# Patient Record
Sex: Female | Born: 1955 | Race: Black or African American | Hispanic: No | Marital: Single | Smoking: Never smoker
Health system: Southern US, Community
[De-identification: ages and names within clinical notes are randomized; demographics above are authoritative.]

## PROBLEM LIST (undated history)

## (undated) DIAGNOSIS — Z5189 Encounter for other specified aftercare: Secondary | ICD-10-CM

## (undated) DIAGNOSIS — E039 Hypothyroidism, unspecified: Secondary | ICD-10-CM

## (undated) DIAGNOSIS — F329 Major depressive disorder, single episode, unspecified: Secondary | ICD-10-CM

## (undated) DIAGNOSIS — F32A Depression, unspecified: Secondary | ICD-10-CM

## (undated) DIAGNOSIS — I10 Essential (primary) hypertension: Secondary | ICD-10-CM

## (undated) DIAGNOSIS — M1990 Unspecified osteoarthritis, unspecified site: Secondary | ICD-10-CM

## (undated) DIAGNOSIS — E785 Hyperlipidemia, unspecified: Secondary | ICD-10-CM

## (undated) DIAGNOSIS — E119 Type 2 diabetes mellitus without complications: Secondary | ICD-10-CM

## (undated) DIAGNOSIS — H269 Unspecified cataract: Secondary | ICD-10-CM

## (undated) HISTORY — DX: Encounter for other specified aftercare: Z51.89

## (undated) HISTORY — DX: Hyperlipidemia, unspecified: E78.5

## (undated) HISTORY — PX: TUBAL LIGATION: SHX77

## (undated) HISTORY — DX: Unspecified cataract: H26.9

## (undated) HISTORY — PX: ABDOMINAL HYSTERECTOMY: SHX81

---

## 2006-07-26 ENCOUNTER — Emergency Department (HOSPITAL_COMMUNITY): Admission: EM | Admit: 2006-07-26 | Discharge: 2006-07-26 | Payer: Self-pay | Admitting: *Deleted

## 2013-09-07 DIAGNOSIS — E1169 Type 2 diabetes mellitus with other specified complication: Secondary | ICD-10-CM | POA: Insufficient documentation

## 2013-09-07 DIAGNOSIS — E785 Hyperlipidemia, unspecified: Secondary | ICD-10-CM | POA: Insufficient documentation

## 2013-09-07 DIAGNOSIS — E669 Obesity, unspecified: Secondary | ICD-10-CM | POA: Insufficient documentation

## 2013-12-08 DIAGNOSIS — I152 Hypertension secondary to endocrine disorders: Secondary | ICD-10-CM | POA: Insufficient documentation

## 2013-12-08 DIAGNOSIS — E119 Type 2 diabetes mellitus without complications: Secondary | ICD-10-CM | POA: Insufficient documentation

## 2013-12-08 DIAGNOSIS — E669 Obesity, unspecified: Secondary | ICD-10-CM | POA: Insufficient documentation

## 2013-12-08 DIAGNOSIS — Z7985 Long-term (current) use of injectable non-insulin antidiabetic drugs: Secondary | ICD-10-CM | POA: Insufficient documentation

## 2013-12-08 DIAGNOSIS — I10 Essential (primary) hypertension: Secondary | ICD-10-CM | POA: Insufficient documentation

## 2013-12-08 DIAGNOSIS — E1169 Type 2 diabetes mellitus with other specified complication: Secondary | ICD-10-CM | POA: Insufficient documentation

## 2013-12-08 DIAGNOSIS — E1159 Type 2 diabetes mellitus with other circulatory complications: Secondary | ICD-10-CM | POA: Insufficient documentation

## 2015-05-28 DIAGNOSIS — E559 Vitamin D deficiency, unspecified: Secondary | ICD-10-CM | POA: Insufficient documentation

## 2015-05-28 DIAGNOSIS — E039 Hypothyroidism, unspecified: Secondary | ICD-10-CM | POA: Insufficient documentation

## 2015-05-29 DIAGNOSIS — M5440 Lumbago with sciatica, unspecified side: Secondary | ICD-10-CM | POA: Insufficient documentation

## 2015-06-15 ENCOUNTER — Other Ambulatory Visit: Payer: Self-pay | Admitting: Neurological Surgery

## 2015-06-29 ENCOUNTER — Encounter (HOSPITAL_COMMUNITY)
Admission: RE | Admit: 2015-06-29 | Discharge: 2015-06-29 | Disposition: A | Discharge status: Home / Self Care | Payer: BLUE CROSS/BLUE SHIELD | Source: Ambulatory Visit | Attending: Neurological Surgery | Admitting: Neurological Surgery

## 2015-06-29 ENCOUNTER — Encounter (HOSPITAL_COMMUNITY): Payer: Self-pay

## 2015-06-29 DIAGNOSIS — Z01812 Encounter for preprocedural laboratory examination: Secondary | ICD-10-CM

## 2015-06-29 DIAGNOSIS — Z01818 Encounter for other preprocedural examination: Secondary | ICD-10-CM | POA: Insufficient documentation

## 2015-06-29 DIAGNOSIS — I10 Essential (primary) hypertension: Secondary | ICD-10-CM

## 2015-06-29 DIAGNOSIS — Z0183 Encounter for blood typing: Secondary | ICD-10-CM

## 2015-06-29 DIAGNOSIS — M4317 Spondylolisthesis, lumbosacral region: Secondary | ICD-10-CM

## 2015-06-29 DIAGNOSIS — M4726 Other spondylosis with radiculopathy, lumbar region: Secondary | ICD-10-CM | POA: Insufficient documentation

## 2015-06-29 HISTORY — DX: Essential (primary) hypertension: I10

## 2015-06-29 HISTORY — DX: Type 2 diabetes mellitus without complications: E11.9

## 2015-06-29 HISTORY — DX: Hypothyroidism, unspecified: E03.9

## 2015-06-29 HISTORY — DX: Major depressive disorder, single episode, unspecified: F32.9

## 2015-06-29 HISTORY — DX: Depression, unspecified: F32.A

## 2015-06-29 HISTORY — DX: Unspecified osteoarthritis, unspecified site: M19.90

## 2015-06-29 LAB — APTT: aPTT: 34 seconds (ref 24–37)

## 2015-06-29 LAB — CBC
HCT: 40.8 % (ref 36.0–46.0)
HEMOGLOBIN: 12.9 g/dL (ref 12.0–15.0)
MCH: 27.9 pg (ref 26.0–34.0)
MCHC: 31.6 g/dL (ref 30.0–36.0)
MCV: 88.1 fL (ref 78.0–100.0)
Platelets: 290 10*3/uL (ref 150–400)
RBC: 4.63 MIL/uL (ref 3.87–5.11)
RDW: 13.9 % (ref 11.5–15.5)
WBC: 8.3 10*3/uL (ref 4.0–10.5)

## 2015-06-29 LAB — BASIC METABOLIC PANEL
ANION GAP: 10 (ref 5–15)
BUN: 9 mg/dL (ref 6–20)
CALCIUM: 9.4 mg/dL (ref 8.9–10.3)
CO2: 23 mmol/L (ref 22–32)
Chloride: 108 mmol/L (ref 101–111)
Creatinine, Ser: 0.62 mg/dL (ref 0.44–1.00)
Glucose, Bld: 103 mg/dL — ABNORMAL HIGH (ref 65–99)
Potassium: 4.3 mmol/L (ref 3.5–5.1)
SODIUM: 141 mmol/L (ref 135–145)

## 2015-06-29 LAB — PROTIME-INR
INR: 0.98 (ref 0.00–1.49)
PROTHROMBIN TIME: 13.2 s (ref 11.6–15.2)

## 2015-06-29 LAB — TYPE AND SCREEN
ABO/RH(D): O POS
ANTIBODY SCREEN: NEGATIVE

## 2015-06-29 LAB — SURGICAL PCR SCREEN
MRSA, PCR: NEGATIVE
Staphylococcus aureus: NEGATIVE

## 2015-06-29 LAB — ABO/RH: ABO/RH(D): O POS

## 2015-06-29 LAB — GLUCOSE, CAPILLARY: Glucose-Capillary: 91 mg/dL (ref 65–99)

## 2015-06-29 NOTE — Progress Notes (Signed)
PCP is Dr. Juanita Craver Denies seeing a cardiologist Denies ever having a card cath, or stress test Pt states she might have had an echo request sent to Dr Burnett's office Reports her fasting cbgs' run 130's - 140's .

## 2015-06-29 NOTE — Pre-Procedure Instructions (Signed)
Lisa Stephens  06/29/2015      [facility] - [place], [place] - [address] [name]  [address] Phone: [phone] Fax: [phone]    Your procedure is scheduled on Mon, Mar 27 @ 8:30 AM  Report to [facility] Admitting at 6:30 AM  Call this number if you have problems the morning of surgery:  [phone]   Remember:  Do not eat food or drink liquids after midnight.  Take these medicines the morning of surgery with A SIP OF WATER Amlodipine(Norvasc) and Synthroid(Levothyroxine)              Stop taking your Aspirin,Aleve,and Fish Oil. No Goody's,BC's,Advil,Motrin,Ibuprofen,or any Herbal Medications.     How to Manage Your Diabetes Before and After Surgery  Why is it important to control my blood sugar before and after surgery? . Improving blood sugar levels before and after surgery helps healing and can limit problems. . A way of improving blood sugar control is eating a healthy diet by: o  Eating less sugar and carbohydrates o  Increasing activity/exercise o  Talking with your doctor about reaching your blood sugar goals . High blood sugars (greater than 180 mg/dL) can raise your risk of infections and slow your recovery, so you will need to focus on controlling your diabetes during the weeks before surgery. . Make sure that the doctor who takes care of your diabetes knows about your planned surgery including the date and location.  How do I manage my blood sugar before surgery? . Check your blood sugar at least 4 times a day, starting 2 days before surgery, to make sure that the level is not too high or low. o Check your blood sugar the morning of your surgery when you wake up and every 2 hours until you get to the Short Stay unit. . If your blood sugar is less than 70 mg/dL, you will need to treat for low blood sugar: o Do not take insulin. o Treat a low blood sugar (less than 70 mg/dL) with  cup of clear juice (cranberry or  apple), 4 glucose tablets, OR glucose gel. o Recheck blood sugar in 15 minutes after treatment (to make sure it is greater than 70 mg/dL). If your blood sugar is not greater than 70 mg/dL on recheck, call [phone] for further instructions. . Report your blood sugar to the short stay nurse when you get to Short Stay.  . If you are admitted to the hospital after surgery: o Your blood sugar will be checked by the staff and you will probably be given insulin after surgery (instead of oral diabetes medicines) to make sure you have good blood sugar levels. o The goal for blood sugar control after surgery is 80-180 mg/dL.              WHAT DO I DO ABOUT MY DIABETES MEDICATION?   Marland Kitchen Do not take oral diabetes medicines (pills) the morning of surgery.  .       .   . The day of surgery, do not take other diabetes injectables, including Byetta (exenatide), Bydureon (exenatide ER), Victoza (liraglutide), or Trulicity (dulaglutide).  . If your CBG is greater than 220 mg/dL, you may take  of your sliding scale (correction) dose of insulin.  Other Instructions:          Patient Signature:  Date:   Nurse Signature:  Date:   Reviewed and Endorsed by [facility]  Patient Education Committee, August 2015   Do not wear jewelry, make-up or nail polish.  Do not wear lotions, powders, or perfumes.  You may wear deodorant.  Do not shave 48 hours prior to surgery.   Do not bring valuables to the hospital.  [facility] is not responsible for any belongings or valuables.  Contacts, dentures or bridgework may not be worn into surgery.  Leave your suitcase in the car.  After surgery it may be brought to your room.  For patients admitted to the hospital, discharge time will be determined by your treatment team.  Patients discharged the day of surgery will not be allowed to drive home.    Special instructions:  [facility] - Preparing for Surgery  Before surgery, you can play an  important role.  Because skin is not sterile, your skin needs to be as free of germs as possible.  You can reduce the number of germs on you skin by washing with CHG (chlorahexidine gluconate) soap before surgery.  CHG is an antiseptic cleaner which kills germs and bonds with the skin to continue killing germs even after washing.  Please DO NOT use if you have an allergy to CHG or antibacterial soaps.  If your skin becomes reddened/irritated stop using the CHG and inform your nurse when you arrive at Short Stay.  Do not shave (including legs and underarms) for at least 48 hours prior to the first CHG shower.  You may shave your face.  Please follow these instructions carefully:   1.  Shower with CHG Soap the night before surgery and the                                morning of Surgery.  2.  If you choose to wash your hair, wash your hair first as usual with your       normal shampoo.  3.  After you shampoo, rinse your hair and body thoroughly to remove the                      Shampoo.  4.  Use CHG as you would any other liquid soap.  You can apply chg directly       to the skin and wash gently with scrungie or a clean washcloth.  5.  Apply the CHG Soap to your body ONLY FROM THE NECK DOWN.        Do not use on open wounds or open sores.  Avoid contact with your eyes,       ears, mouth and genitals (private parts).  Wash genitals (private parts)       with your normal soap.  6.  Wash thoroughly, paying special attention to the area where your surgery        will be performed.  7.  Thoroughly rinse your body with warm water from the neck down.  8.  DO NOT shower/wash with your normal soap after using and rinsing off       the CHG Soap.  9.  Pat yourself dry with a clean towel.            10.  Wear clean pajamas.            11.  Place clean sheets on your bed the night of your first shower and do not        sleep with pets.  Day  of Surgery  Do not apply any lotions/deoderants the morning of  surgery.  Please wear clean clothes to the hospital/surgery center.    Please read over the following fact sheets that you were given. Pain Booklet, Coughing and Deep Breathing, Blood Transfusion Information, MRSA Information and Surgical Site Infection Prevention

## 2015-06-30 LAB — HEMOGLOBIN A1C
HEMOGLOBIN A1C: 6.8 % — AB (ref 4.8–5.6)
MEAN PLASMA GLUCOSE: 148 mg/dL

## 2015-07-01 MED ORDER — DEXTROSE 5 % IV SOLN
2.0000 g | INTRAVENOUS | Status: AC
  Administered 2015-07-02 (×2): 2 g via INTRAVENOUS
  Filled 2015-07-01: qty 20

## 2015-07-02 ENCOUNTER — Inpatient Hospital Stay (HOSPITAL_COMMUNITY)
Admission: AD | Admit: 2015-07-02 | Discharge: 2015-07-04 | DRG: 460 | Disposition: A | Discharge status: Home / Self Care | Payer: BLUE CROSS/BLUE SHIELD | Source: Ambulatory Visit | Attending: Neurological Surgery | Admitting: Neurological Surgery

## 2015-07-02 ENCOUNTER — Inpatient Hospital Stay (HOSPITAL_COMMUNITY): Payer: BLUE CROSS/BLUE SHIELD | Admitting: Anesthesiology

## 2015-07-02 ENCOUNTER — Encounter (HOSPITAL_COMMUNITY): Payer: Self-pay | Admitting: Anesthesiology

## 2015-07-02 ENCOUNTER — Encounter (HOSPITAL_COMMUNITY)
Admission: AD | Disposition: A | Discharge status: Home / Self Care | Payer: Self-pay | Source: Ambulatory Visit | Attending: Neurological Surgery

## 2015-07-02 ENCOUNTER — Inpatient Hospital Stay (HOSPITAL_COMMUNITY): Payer: BLUE CROSS/BLUE SHIELD

## 2015-07-02 DIAGNOSIS — I10 Essential (primary) hypertension: Secondary | ICD-10-CM | POA: Diagnosis present

## 2015-07-02 DIAGNOSIS — E039 Hypothyroidism, unspecified: Secondary | ICD-10-CM | POA: Diagnosis present

## 2015-07-02 DIAGNOSIS — M4316 Spondylolisthesis, lumbar region: Secondary | ICD-10-CM | POA: Diagnosis present

## 2015-07-02 DIAGNOSIS — M4317 Spondylolisthesis, lumbosacral region: Secondary | ICD-10-CM | POA: Diagnosis present

## 2015-07-02 DIAGNOSIS — Z7984 Long term (current) use of oral hypoglycemic drugs: Secondary | ICD-10-CM | POA: Diagnosis not present

## 2015-07-02 DIAGNOSIS — F329 Major depressive disorder, single episode, unspecified: Secondary | ICD-10-CM | POA: Diagnosis present

## 2015-07-02 DIAGNOSIS — Z7982 Long term (current) use of aspirin: Secondary | ICD-10-CM | POA: Diagnosis not present

## 2015-07-02 DIAGNOSIS — M4727 Other spondylosis with radiculopathy, lumbosacral region: Secondary | ICD-10-CM | POA: Diagnosis present

## 2015-07-02 DIAGNOSIS — M549 Dorsalgia, unspecified: Secondary | ICD-10-CM | POA: Diagnosis present

## 2015-07-02 DIAGNOSIS — E119 Type 2 diabetes mellitus without complications: Secondary | ICD-10-CM | POA: Diagnosis present

## 2015-07-02 HISTORY — PX: MAXIMUM ACCESS (MAS)POSTERIOR LUMBAR INTERBODY FUSION (PLIF) 2 LEVEL: SHX6369

## 2015-07-02 LAB — GLUCOSE, CAPILLARY
GLUCOSE-CAPILLARY: 113 mg/dL — AB (ref 65–99)
GLUCOSE-CAPILLARY: 117 mg/dL — AB (ref 65–99)
Glucose-Capillary: 115 mg/dL — ABNORMAL HIGH (ref 65–99)
Glucose-Capillary: 160 mg/dL — ABNORMAL HIGH (ref 65–99)

## 2015-07-02 SURGERY — FOR MAXIMUM ACCESS (MAS) POSTERIOR LUMBAR INTERBODY FUSION (PLIF) 2 LEVEL
Anesthesia: General | Site: Back

## 2015-07-02 MED ORDER — SODIUM CHLORIDE 0.9 % IJ SOLN
INTRAMUSCULAR | Status: AC
  Filled 2015-07-02: qty 20

## 2015-07-02 MED ORDER — OXYCODONE HCL 5 MG/5ML PO SOLN: 5.0000 mg | Freq: Once | ORAL | Status: DC | PRN

## 2015-07-02 MED ORDER — FENTANYL CITRATE (PF) 250 MCG/5ML IJ SOLN
INTRAMUSCULAR | Status: AC
  Filled 2015-07-02: qty 5

## 2015-07-02 MED ORDER — 0.9 % SODIUM CHLORIDE (POUR BTL) OPTIME
TOPICAL | Status: DC | PRN
  Administered 2015-07-02: 1000 mL

## 2015-07-02 MED ORDER — GLYCOPYRROLATE 0.2 MG/ML IJ SOLN
INTRAMUSCULAR | Status: AC
  Filled 2015-07-02: qty 1

## 2015-07-02 MED ORDER — METFORMIN HCL 500 MG PO TABS
500.0000 mg | ORAL_TABLET | Freq: Two times a day (BID) | ORAL | Status: DC
  Administered 2015-07-02 – 2015-07-04 (×4): 500 mg via ORAL
  Filled 2015-07-02 (×4): qty 1

## 2015-07-02 MED ORDER — SODIUM CHLORIDE 0.9% FLUSH: 3.0000 mL | INTRAVENOUS | Status: DC | PRN

## 2015-07-02 MED ORDER — METHOCARBAMOL 500 MG PO TABS
750.0000 mg | ORAL_TABLET | Freq: Four times a day (QID) | ORAL | Status: DC
Start: 2015-07-02 — End: 2015-07-04
  Administered 2015-07-02 – 2015-07-03 (×5): 750 mg via ORAL
  Filled 2015-07-02 (×5): qty 2

## 2015-07-02 MED ORDER — ONDANSETRON HCL 4 MG/2ML IJ SOLN
4.0000 mg | Freq: Once | INTRAMUSCULAR | Status: DC | PRN
Start: 2015-07-02 — End: 2015-07-02

## 2015-07-02 MED ORDER — SUCCINYLCHOLINE CHLORIDE 20 MG/ML IJ SOLN
INTRAMUSCULAR | Status: AC
  Filled 2015-07-02: qty 1

## 2015-07-02 MED ORDER — PHENOL 1.4 % MT LIQD: 1.0000 | OROMUCOSAL | Status: DC | PRN

## 2015-07-02 MED ORDER — ROCURONIUM BROMIDE 100 MG/10ML IV SOLN
INTRAVENOUS | Status: DC | PRN
  Administered 2015-07-02: 50 mg via INTRAVENOUS
  Administered 2015-07-02: 10 mg via INTRAVENOUS
  Administered 2015-07-02: 20 mg via INTRAVENOUS
  Administered 2015-07-02: 10 mg via INTRAVENOUS

## 2015-07-02 MED ORDER — FLEET ENEMA 7-19 GM/118ML RE ENEM: 1.0000 | ENEMA | Freq: Once | RECTAL | Status: DC | PRN

## 2015-07-02 MED ORDER — THROMBIN 20000 UNITS EX SOLR
CUTANEOUS | Status: DC | PRN
  Administered 2015-07-02 (×2): 20 mL via TOPICAL

## 2015-07-02 MED ORDER — HYDROMORPHONE HCL 1 MG/ML IJ SOLN: 0.2500 mg | INTRAMUSCULAR | Status: DC | PRN

## 2015-07-02 MED ORDER — OXYCODONE HCL 5 MG PO TABS: 5.0000 mg | ORAL_TABLET | Freq: Once | ORAL | Status: DC | PRN

## 2015-07-02 MED ORDER — LIDOCAINE HCL (CARDIAC) 20 MG/ML IV SOLN
INTRAVENOUS | Status: DC | PRN
  Administered 2015-07-02 (×2): 50 mg via INTRAVENOUS

## 2015-07-02 MED ORDER — AMLODIPINE BESYLATE 10 MG PO TABS
10.0000 mg | ORAL_TABLET | Freq: Every day | ORAL | Status: DC
  Administered 2015-07-03: 10 mg via ORAL
  Filled 2015-07-02 (×4): qty 1

## 2015-07-02 MED ORDER — LEVOTHYROXINE SODIUM 100 MCG PO TABS
100.0000 ug | ORAL_TABLET | Freq: Every day | ORAL | Status: DC
  Administered 2015-07-03 – 2015-07-04 (×2): 100 ug via ORAL
  Filled 2015-07-02 (×2): qty 1

## 2015-07-02 MED ORDER — PANTOPRAZOLE SODIUM 40 MG IV SOLR
40.0000 mg | Freq: Every day | INTRAVENOUS | Status: DC
Start: 2015-07-02 — End: 2015-07-03
  Administered 2015-07-02: 40 mg via INTRAVENOUS
  Filled 2015-07-02: qty 40

## 2015-07-02 MED ORDER — SODIUM CHLORIDE 0.9% FLUSH
3.0000 mL | Freq: Two times a day (BID) | INTRAVENOUS | Status: DC
  Administered 2015-07-02: 3 mL via INTRAVENOUS

## 2015-07-02 MED ORDER — ONDANSETRON HCL 4 MG/2ML IJ SOLN
INTRAMUSCULAR | Status: DC | PRN
  Administered 2015-07-02: 4 mg via INTRAVENOUS

## 2015-07-02 MED ORDER — SUCCINYLCHOLINE CHLORIDE 20 MG/ML IJ SOLN
INTRAMUSCULAR | Status: DC | PRN
Start: 2015-07-02 — End: 2015-07-02
  Administered 2015-07-02: 140 mg via INTRAVENOUS

## 2015-07-02 MED ORDER — BISACODYL 5 MG PO TBEC: 5.0000 mg | DELAYED_RELEASE_TABLET | Freq: Every day | ORAL | Status: DC | PRN

## 2015-07-02 MED ORDER — ARTIFICIAL TEARS OP OINT
TOPICAL_OINTMENT | OPHTHALMIC | Status: AC
  Filled 2015-07-02: qty 3.5

## 2015-07-02 MED ORDER — VITAMIN D 1000 UNITS PO TABS
5000.0000 [IU] | ORAL_TABLET | Freq: Every day | ORAL | Status: DC
  Administered 2015-07-03: 5000 [IU] via ORAL
  Filled 2015-07-02: qty 5

## 2015-07-02 MED ORDER — PHENYLEPHRINE HCL 10 MG/ML IJ SOLN
INTRAMUSCULAR | Status: AC
  Filled 2015-07-02: qty 1

## 2015-07-02 MED ORDER — SUGAMMADEX SODIUM 200 MG/2ML IV SOLN
INTRAVENOUS | Status: AC
  Filled 2015-07-02: qty 2

## 2015-07-02 MED ORDER — SODIUM CHLORIDE 0.9 % IV SOLN
10.0000 mg | INTRAVENOUS | Status: DC | PRN
  Administered 2015-07-02: 15 ug/min via INTRAVENOUS

## 2015-07-02 MED ORDER — PROPOFOL 10 MG/ML IV BOLUS
INTRAVENOUS | Status: DC | PRN
  Administered 2015-07-02: 170 mg via INTRAVENOUS
  Administered 2015-07-02: 30 mg via INTRAVENOUS

## 2015-07-02 MED ORDER — ZOLPIDEM TARTRATE 5 MG PO TABS: 5.0000 mg | ORAL_TABLET | Freq: Every evening | ORAL | Status: DC | PRN

## 2015-07-02 MED ORDER — BACITRACIN 50000 UNITS IM SOLR
INTRAMUSCULAR | Status: DC | PRN
  Administered 2015-07-02: 500 mL

## 2015-07-02 MED ORDER — VITAMIN D3 125 MCG (5000 UT) PO CAPS: 5000.0000 [IU] | ORAL_CAPSULE | Freq: Every day | ORAL | Status: DC

## 2015-07-02 MED ORDER — VANCOMYCIN HCL 1000 MG IV SOLR
INTRAVENOUS | Status: AC
  Filled 2015-07-02: qty 1000

## 2015-07-02 MED ORDER — MIDAZOLAM HCL 2 MG/2ML IJ SOLN
INTRAMUSCULAR | Status: AC
  Filled 2015-07-02: qty 2

## 2015-07-02 MED ORDER — EPHEDRINE SULFATE 50 MG/ML IJ SOLN
INTRAMUSCULAR | Status: DC | PRN
  Administered 2015-07-02: 10 mg via INTRAVENOUS

## 2015-07-02 MED ORDER — SUGAMMADEX SODIUM 200 MG/2ML IV SOLN
INTRAVENOUS | Status: DC | PRN
  Administered 2015-07-02: 176 mg via INTRAVENOUS

## 2015-07-02 MED ORDER — DOCUSATE SODIUM 100 MG PO CAPS
100.0000 mg | ORAL_CAPSULE | Freq: Two times a day (BID) | ORAL | Status: DC
  Administered 2015-07-02 – 2015-07-03 (×4): 100 mg via ORAL
  Filled 2015-07-02 (×4): qty 1

## 2015-07-02 MED ORDER — VANCOMYCIN HCL 1000 MG IV SOLR
INTRAVENOUS | Status: DC | PRN
  Administered 2015-07-02: 1000 mg

## 2015-07-02 MED ORDER — DIAZEPAM 5 MG PO TABS
5.0000 mg | ORAL_TABLET | Freq: Four times a day (QID) | ORAL | Status: DC | PRN
  Administered 2015-07-03: 5 mg via ORAL
  Filled 2015-07-02: qty 1

## 2015-07-02 MED ORDER — ACETAMINOPHEN 10 MG/ML IV SOLN
INTRAVENOUS | Status: AC
  Administered 2015-07-02: 1000 mg via INTRAVENOUS
  Filled 2015-07-02: qty 100

## 2015-07-02 MED ORDER — MIDAZOLAM HCL 5 MG/5ML IJ SOLN
INTRAMUSCULAR | Status: DC | PRN
  Administered 2015-07-02: 2 mg via INTRAVENOUS

## 2015-07-02 MED ORDER — AMLODIPINE-OLMESARTAN 10-40 MG PO TABS: 1.0000 | ORAL_TABLET | Freq: Every day | ORAL | Status: DC

## 2015-07-02 MED ORDER — SODIUM CHLORIDE 0.9 % IV SOLN: INTRAVENOUS | Status: DC

## 2015-07-02 MED ORDER — EPHEDRINE SULFATE 50 MG/ML IJ SOLN
INTRAMUSCULAR | Status: AC
  Filled 2015-07-02: qty 1

## 2015-07-02 MED ORDER — PROPOFOL 1000 MG/100ML IV EMUL
INTRAVENOUS | Status: AC
  Administered 2015-07-02 (×2): 25 ug/kg/min via INTRAVENOUS
  Filled 2015-07-02: qty 100

## 2015-07-02 MED ORDER — FENTANYL CITRATE (PF) 100 MCG/2ML IJ SOLN
INTRAMUSCULAR | Status: DC | PRN
  Administered 2015-07-02: 100 ug via INTRAVENOUS
  Administered 2015-07-02 (×4): 50 ug via INTRAVENOUS

## 2015-07-02 MED ORDER — OXYCODONE HCL 5 MG PO TABS
5.0000 mg | ORAL_TABLET | ORAL | Status: DC | PRN
  Administered 2015-07-03 (×2): 5 mg via ORAL
  Filled 2015-07-02 (×2): qty 1

## 2015-07-02 MED ORDER — GABAPENTIN 100 MG PO CAPS
200.0000 mg | ORAL_CAPSULE | Freq: Three times a day (TID) | ORAL | Status: DC
  Administered 2015-07-02 – 2015-07-03 (×5): 200 mg via ORAL
  Filled 2015-07-02 (×5): qty 2

## 2015-07-02 MED ORDER — HYDROMORPHONE HCL 1 MG/ML IJ SOLN
1.0000 mg | INTRAMUSCULAR | Status: DC | PRN
Start: 2015-07-02 — End: 2015-07-04

## 2015-07-02 MED ORDER — MENTHOL 3 MG MT LOZG: 1.0000 | LOZENGE | OROMUCOSAL | Status: DC | PRN

## 2015-07-02 MED ORDER — OXYCODONE HCL ER 10 MG PO T12A
20.0000 mg | EXTENDED_RELEASE_TABLET | Freq: Two times a day (BID) | ORAL | Status: DC
  Administered 2015-07-02 – 2015-07-03 (×4): 20 mg via ORAL
  Filled 2015-07-02 (×4): qty 2

## 2015-07-02 MED ORDER — BUPIVACAINE LIPOSOME 1.3 % IJ SUSP
20.0000 mL | INTRAMUSCULAR | Status: DC
  Filled 2015-07-02: qty 20

## 2015-07-02 MED ORDER — LIDOCAINE HCL (CARDIAC) 20 MG/ML IV SOLN
INTRAVENOUS | Status: AC
  Filled 2015-07-02: qty 5

## 2015-07-02 MED ORDER — IRBESARTAN 300 MG PO TABS
300.0000 mg | ORAL_TABLET | Freq: Every day | ORAL | Status: DC
  Administered 2015-07-03: 300 mg via ORAL
  Filled 2015-07-02 (×4): qty 1

## 2015-07-02 MED ORDER — CEFAZOLIN SODIUM 1-5 GM-% IV SOLN
1.0000 g | Freq: Three times a day (TID) | INTRAVENOUS | Status: AC
  Administered 2015-07-02 – 2015-07-03 (×2): 1 g via INTRAVENOUS
  Filled 2015-07-02 (×2): qty 50

## 2015-07-02 MED ORDER — LIDOCAINE-EPINEPHRINE 2 %-1:100000 IJ SOLN
INTRAMUSCULAR | Status: DC | PRN
  Administered 2015-07-02: 20 mL via INTRADERMAL

## 2015-07-02 MED ORDER — BUPIVACAINE LIPOSOME 1.3 % IJ SUSP
INTRAMUSCULAR | Status: DC | PRN
  Administered 2015-07-02: 20 mL

## 2015-07-02 MED ORDER — SENNA 8.6 MG PO TABS
1.0000 | ORAL_TABLET | Freq: Two times a day (BID) | ORAL | Status: DC
  Administered 2015-07-02 – 2015-07-03 (×3): 8.6 mg via ORAL
  Filled 2015-07-02 (×3): qty 1

## 2015-07-02 MED ORDER — ROCURONIUM BROMIDE 50 MG/5ML IV SOLN
INTRAVENOUS | Status: AC
  Filled 2015-07-02: qty 2

## 2015-07-02 MED ORDER — THROMBIN 5000 UNITS EX SOLR
CUTANEOUS | Status: DC | PRN
  Administered 2015-07-02 (×2): 10 mL via TOPICAL

## 2015-07-02 MED ORDER — ALBUMIN HUMAN 5 % IV SOLN
INTRAVENOUS | Status: DC | PRN
  Administered 2015-07-02: 13:00:00 via INTRAVENOUS

## 2015-07-02 MED ORDER — PHENYLEPHRINE 40 MCG/ML (10ML) SYRINGE FOR IV PUSH (FOR BLOOD PRESSURE SUPPORT)
PREFILLED_SYRINGE | INTRAVENOUS | Status: AC
  Filled 2015-07-02: qty 10

## 2015-07-02 MED ORDER — ONDANSETRON HCL 4 MG/2ML IJ SOLN
INTRAMUSCULAR | Status: AC
  Filled 2015-07-02: qty 2

## 2015-07-02 MED ORDER — ONDANSETRON HCL 4 MG/2ML IJ SOLN: 4.0000 mg | INTRAMUSCULAR | Status: DC | PRN

## 2015-07-02 MED ORDER — ASPIRIN EC 81 MG PO TBEC
81.0000 mg | DELAYED_RELEASE_TABLET | Freq: Every day | ORAL | Status: DC
  Administered 2015-07-03: 81 mg via ORAL
  Filled 2015-07-02 (×2): qty 1

## 2015-07-02 MED ORDER — PHENYLEPHRINE HCL 10 MG/ML IJ SOLN
INTRAMUSCULAR | Status: DC | PRN
  Administered 2015-07-02: 40 ug via INTRAVENOUS
  Administered 2015-07-02 (×2): 80 ug via INTRAVENOUS

## 2015-07-02 MED ORDER — ACETAMINOPHEN 500 MG PO TABS
1000.0000 mg | ORAL_TABLET | Freq: Four times a day (QID) | ORAL | Status: DC
  Administered 2015-07-02 – 2015-07-04 (×6): 1000 mg via ORAL
  Filled 2015-07-02 (×7): qty 2

## 2015-07-02 MED ORDER — ATORVASTATIN CALCIUM 20 MG PO TABS
20.0000 mg | ORAL_TABLET | ORAL | Status: DC
  Administered 2015-07-02: 20 mg via ORAL
  Filled 2015-07-02 (×2): qty 1

## 2015-07-02 MED ORDER — ARTIFICIAL TEARS OP OINT
TOPICAL_OINTMENT | OPHTHALMIC | Status: DC | PRN
  Administered 2015-07-02: 1 via OPHTHALMIC

## 2015-07-02 MED ORDER — LACTATED RINGERS IV SOLN
INTRAVENOUS | Status: DC
  Administered 2015-07-02 (×3): via INTRAVENOUS

## 2015-07-02 MED ORDER — PROPOFOL 10 MG/ML IV BOLUS
INTRAVENOUS | Status: AC
  Filled 2015-07-02: qty 40

## 2015-07-02 SURGICAL SUPPLY — 77 items
ADH SKN CLS APL DERMABOND .7 (GAUZE/BANDAGES/DRESSINGS) ×1
ADH SKN CLS LQ APL DERMABOND (GAUZE/BANDAGES/DRESSINGS) ×1
APL SKNCLS STERI-STRIP NONHPOA (GAUZE/BANDAGES/DRESSINGS) ×1
BAG DECANTER FOR FLEXI CONT (MISCELLANEOUS) ×2 IMPLANT
BENZOIN TINCTURE PRP APPL 2/3 (GAUZE/BANDAGES/DRESSINGS) ×2 IMPLANT
BIT DRILL NEURO 2X3.1 SFT TUCH (MISCELLANEOUS) ×1 IMPLANT
BLADE CLIPPER SURG (BLADE) IMPLANT
BLADE SURG 11 STRL SS (BLADE) ×2 IMPLANT
BUR MATCHSTICK NEURO 3.0X3.8 (BURR) ×1 IMPLANT
BUR ROUND FLUTED 5 RND (BURR) ×5 IMPLANT
CAGE MAS PLIF 9X9X23-8 LUMBAR (Cage) ×4 IMPLANT
CANISTER SUCT 3000ML PPV (MISCELLANEOUS) ×4 IMPLANT
CHLORAPREP W/TINT 26ML (MISCELLANEOUS) ×2 IMPLANT
DECANTER SPIKE VIAL GLASS SM (MISCELLANEOUS) ×2 IMPLANT
DERMABOND ADHESIVE PROPEN (GAUZE/BANDAGES/DRESSINGS) ×1
DERMABOND ADVANCED (GAUZE/BANDAGES/DRESSINGS) ×1
DERMABOND ADVANCED .7 DNX12 (GAUZE/BANDAGES/DRESSINGS) ×1 IMPLANT
DERMABOND ADVANCED .7 DNX6 (GAUZE/BANDAGES/DRESSINGS) ×1 IMPLANT
DEVICE THRD PEDIGUARD 2.5MM XS (MISCELLANEOUS) IMPLANT
DRAPE C-ARM 42X72 X-RAY (DRAPES) ×3 IMPLANT
DRAPE C-ARMOR (DRAPES) ×2 IMPLANT
DRAPE MICROSCOPE LEICA (MISCELLANEOUS) IMPLANT
DRAPE POUCH INSTRU U-SHP 10X18 (DRAPES) ×2 IMPLANT
DRAPE SURG 17X23 STRL (DRAPES) ×2 IMPLANT
DRILL NEURO 2X3.1 SOFT TOUCH (MISCELLANEOUS) ×2
DRSG OPSITE POSTOP 4X8 (GAUZE/BANDAGES/DRESSINGS) ×1 IMPLANT
ELECT REM PT RETURN 9FT ADLT (ELECTROSURGICAL) ×2
ELECTRODE REM PT RTRN 9FT ADLT (ELECTROSURGICAL) ×1 IMPLANT
GAUZE SPONGE 4X4 12PLY STRL (GAUZE/BANDAGES/DRESSINGS) IMPLANT
GAUZE SPONGE 4X4 16PLY XRAY LF (GAUZE/BANDAGES/DRESSINGS) IMPLANT
GLOVE BIOGEL PI IND STRL 7.5 (GLOVE) ×2 IMPLANT
GLOVE BIOGEL PI INDICATOR 7.5 (GLOVE) ×2
GLOVE EXAM NITRILE LRG STRL (GLOVE) IMPLANT
GLOVE EXAM NITRILE MD LF STRL (GLOVE) IMPLANT
GLOVE EXAM NITRILE XL STR (GLOVE) IMPLANT
GLOVE EXAM NITRILE XS STR PU (GLOVE) IMPLANT
GLOVE SS BIOGEL STRL SZ 7 (GLOVE) ×3 IMPLANT
GLOVE SUPERSENSE BIOGEL SZ 7 (GLOVE) ×3
GOWN STRL REUS W/ TWL LRG LVL3 (GOWN DISPOSABLE) ×3 IMPLANT
GOWN STRL REUS W/ TWL XL LVL3 (GOWN DISPOSABLE) IMPLANT
GOWN STRL REUS W/TWL LRG LVL3 (GOWN DISPOSABLE) ×6
GOWN STRL REUS W/TWL XL LVL3 (GOWN DISPOSABLE) ×4
HEMOSTAT POWDER KIT SURGIFOAM (HEMOSTASIS) ×2 IMPLANT
KIT BASIN OR (CUSTOM PROCEDURE TRAY) ×2 IMPLANT
KIT INFUSE SMALL (Orthopedic Implant) ×1 IMPLANT
KIT ROOM TURNOVER OR (KITS) ×2 IMPLANT
LIGHT SOURCE ANGLE TIP STR 7FT (MISCELLANEOUS) ×1 IMPLANT
NDL HYPO 25X1 1.5 SAFETY (NEEDLE) ×1 IMPLANT
NEEDLE HYPO 21X1.5 SAFETY (NEEDLE) ×4 IMPLANT
NEEDLE HYPO 25X1 1.5 SAFETY (NEEDLE) ×2 IMPLANT
NS IRRIG 1000ML POUR BTL (IV SOLUTION) ×2 IMPLANT
PACK LAMINECTOMY NEURO (CUSTOM PROCEDURE TRAY) ×2 IMPLANT
PACK UNIVERSAL I (CUSTOM PROCEDURE TRAY) ×2 IMPLANT
PAD ARMBOARD 7.5X6 YLW CONV (MISCELLANEOUS) ×6 IMPLANT
PATTIES SURGICAL .5X1.5 (GAUZE/BANDAGES/DRESSINGS) ×2 IMPLANT
PEDIGUARD CLASSIC 2.5MM XS (MISCELLANEOUS) ×2
PUTTY BONE ATTRAX 10CC STRIP (Putty) ×2 IMPLANT
ROD PLIF MAS 65MM (Rod) ×2 IMPLANT
RUBBERBAND STERILE (MISCELLANEOUS) IMPLANT
SCREW LOCK (Screw) ×12 IMPLANT
SCREW LOCK FXNS SPNE MAS PL (Screw) IMPLANT
SCREW SHANK 5.0X35 (Screw) ×4 IMPLANT
SCREW SHANK 5.0X40MM (Screw) ×4 IMPLANT
SCREW TULIP 5.5 (Screw) ×12 IMPLANT
SPONGE NEURO XRAY DETECT 1X3 (DISPOSABLE) ×2 IMPLANT
SPONGE SURGIFOAM ABS GEL 100 (HEMOSTASIS) ×2 IMPLANT
SUT VIC AB 0 CT1 18XCR BRD8 (SUTURE) ×2 IMPLANT
SUT VIC AB 0 CT1 8-18 (SUTURE) ×4
SUT VIC AB 2-0 CT1 18 (SUTURE) ×4 IMPLANT
SUT VIC AB 3-0 SH 8-18 (SUTURE) ×4 IMPLANT
SUT VIC AB 4-0 PS2 27 (SUTURE) ×2 IMPLANT
SYR 20CC LL (SYRINGE) ×2 IMPLANT
SYR 30ML LL (SYRINGE) ×2 IMPLANT
TOWEL OR 17X24 6PK STRL BLUE (TOWEL DISPOSABLE) ×2 IMPLANT
TOWEL OR 17X26 10 PK STRL BLUE (TOWEL DISPOSABLE) ×2 IMPLANT
TUBE CONNECTING 12X1/4 (SUCTIONS) ×2 IMPLANT
WATER STERILE IRR 1000ML POUR (IV SOLUTION) ×2 IMPLANT

## 2015-07-02 NOTE — Progress Notes (Signed)
Orthopedic Tech Progress Note Patient Details:  Lisa Stephens Aug 31, 1955 [id number]  Patient ID: Lisa Stephens, female   DOB: 1955-08-09, 60 y.o.   MRN: [id number] Called in bio-tech brace order; spoke with Lisa Stephens, Lisa Stephens 07/02/2015, 3:49 PM

## 2015-07-02 NOTE — Anesthesia Postprocedure Evaluation (Signed)
Anesthesia Post Note  Patient: Lisa Stephens  Procedure(s) Performed: Procedure(s) (LRB): L3-4 L4-5 Maximum access posterior lumbar interbody fusion (N/A)  Patient location during evaluation: PACU Anesthesia Type: General Level of consciousness: awake and oriented Pain management: pain level controlled Vital Signs Assessment: post-procedure vital signs reviewed and stable Respiratory status: spontaneous breathing and nonlabored ventilation Anesthetic complications: no    Last Vitals:  Filed Vitals:   07/02/15 1510 07/02/15 1526  BP:  135/62  Pulse: 85 86  Temp: 36.6 C 36.7 C  Resp: 13 20    Last Pain:  Filed Vitals:   07/02/15 1549  PainSc: 2                  Shakinah Navis COKER

## 2015-07-02 NOTE — Progress Notes (Signed)
Orthopedic Tech Progress Note Patient Details:  [name] [birth year] [id number] Brace order completed by bio-tech vendor. Patient ID: Lisa Stephens, female   DOB: 1956/01/30, 60 y.o.   MRN: [id number]   Braulio Bosch 07/02/2015, 5:23 PM

## 2015-07-02 NOTE — OR Nursing (Signed)
Pt was source for blood exposure to staff member. Per hospital policy blood was drawn for exposure panel.

## 2015-07-02 NOTE — Anesthesia Preprocedure Evaluation (Signed)
Anesthesia Evaluation  Patient identified by MRN, date of birth, ID band Patient awake    Reviewed: Allergy & Precautions, NPO status , Patient's Chart, lab work & pertinent test results  Airway Mallampati: II  TM Distance: >3 FB Neck ROM: Full    Dental  (+) Teeth Intact, Dental Advisory Given   Pulmonary    breath sounds clear to auscultation       Cardiovascular hypertension,  Rhythm:Regular     Neuro/Psych    GI/Hepatic   Endo/Other  diabetes  Renal/GU      Musculoskeletal   Abdominal (+) + obese,   Peds  Hematology   Anesthesia Other Findings   Reproductive/Obstetrics                             Anesthesia Physical Anesthesia Plan  ASA: III  Anesthesia Plan: General   Post-op Pain Management:    Induction: Intravenous  Airway Management Planned: Oral ETT  Additional Equipment:   Intra-op Plan:   Post-operative Plan: Extubation in OR  Informed Consent: I have reviewed the patients History and Physical, chart, labs and discussed the procedure including the risks, benefits and alternatives for the proposed anesthesia with the patient or authorized representative who has indicated his/her understanding and acceptance.   Dental advisory given  Plan Discussed with: CRNA and Anesthesiologist  Anesthesia Plan Comments: (Lumbar spondylosis Type 2 DM glucose 115 Hypertension  Roberts Gaudy)        Anesthesia Quick Evaluation

## 2015-07-02 NOTE — Transfer of Care (Signed)
Immediate Anesthesia Transfer of Care Note  Patient: Lisa Stephens  Procedure(s) Performed: Procedure(s) with comments: L3-4 L4-5 Maximum access posterior lumbar interbody fusion (N/A) - L3-4 L4-5 Maximum access posterior lumbar interbody fusion  Patient Location: PACU  Anesthesia Type:MAC  Level of Consciousness: awake, alert , oriented and sedated  Airway & Oxygen Therapy: Patient Spontanous Breathing and Patient connected to nasal cannula oxygen  Post-op Assessment: Report given to RN, Post -op Vital signs reviewed and stable and Patient moving all extremities  Post vital signs: Reviewed and stable  Last Vitals:  Filed Vitals:   07/02/15 0636  BP: 162/70  Pulse: 62  Temp: 36.7 C  Resp: 20    Complications: No apparent anesthesia complications

## 2015-07-02 NOTE — H&P (Signed)
CC: Back and leg pain  HPI: Lisa Stephens is a 60 y.o. female with back and lower leg pain refractory to medical management. She has lumbar stenosis as well as spinal instability. We have had a long discussion about the risks and benefits of surgery and the alternatives to surgery. She wishes to proceed. She presents today for L3-4, L4-5 posterior lumbar interbody fusion.  PMH: Past Medical History  Diagnosis Date  . Hypertension   . Shortness of breath dyspnea   . Hypothyroidism   . Diabetes mellitus without complication ([place])   . Depression   . Arthritis     PSH: Past Surgical History  Procedure Laterality Date  . Abdominal hysterectomy    . Tubal ligation      SH: Social History  Substance Use Topics  . Smoking status: Never Smoker   . Smokeless tobacco: None  . Alcohol Use: No    MEDS: Prior to Admission medications   Medication Sig Start Date End Date Taking? Authorizing Provider  amLODipine-olmesartan (AZOR) 10-40 MG tablet Take 1 tablet by mouth daily.   Yes Historical Provider, MD  aspirin EC 81 MG tablet Take 81 mg by mouth daily.   Yes Historical Provider, MD  atorvastatin (LIPITOR) 20 MG tablet Take 20 mg by mouth every Monday, Wednesday, and Friday at 6 PM.   Yes Historical Provider, MD  Cholecalciferol (VITAMIN D3) 5000 units CAPS Take 5,000 Units by mouth daily.   Yes Historical Provider, MD  levothyroxine (SYNTHROID, LEVOTHROID) 100 MCG tablet Take 100 mcg by mouth daily before breakfast.   Yes Historical Provider, MD  metFORMIN (GLUCOPHAGE) 500 MG tablet Take 500 mg by mouth 2 (two) times daily with a meal.   Yes Historical Provider, MD  naproxen sodium (ANAPROX) 220 MG tablet Take 220 mg by mouth daily as needed (for pain).   Yes Historical Provider, MD  Omega-3 Fatty Acids (FISH OIL) 1000 MG CAPS Take 1,000 mg by mouth daily.   Yes Historical Provider, MD    ALLERGY: Allergies  Allergen Reactions  . Voltaren [Diclofenac Sodium] Shortness Of Breath   Wheezing, coughing, runny nose    ROS: ROS  NEUROLOGIC EXAM: Awake, alert, oriented Memory and concentration grossly intact Speech fluent, appropriate CN grossly intact Motor exam: Upper Extremities Deltoid Bicep Tricep Grip  Right 5/5 5/5 5/5 5/5  Left 5/5 5/5 5/5 5/5   Lower Extremity IP Quad PF DF EHL  Right 5/5 5/5 5/5 5/5 5/5  Left 5/5 5/5 5/5 5/5 5/5   Sensation grossly intact to LT  IMAGING: No new imaging  IMPRESSION: - 60 y.o. female lumbosacral spondylolisthesis, lumbosacral spondylosis with radiculopathy.  PLAN: - L3-4, L4-5 posterior lumbar interbody fusion

## 2015-07-02 NOTE — Anesthesia Postprocedure Evaluation (Signed)
Anesthesia Post Note  Patient: Lisa Stephens  Procedure(s) Performed: Procedure(s) (LRB): L3-4 L4-5 Maximum access posterior lumbar interbody fusion (N/A)  Patient location during evaluation: PACU Anesthesia Type: General Level of consciousness: awake, awake and alert and oriented Pain management: pain level controlled Vital Signs Assessment: post-procedure vital signs reviewed and stable Respiratory status: spontaneous breathing, nonlabored ventilation and respiratory function stable Anesthetic complications: no    Last Vitals:  Filed Vitals:   07/02/15 1510 07/02/15 1526  BP:  135/62  Pulse: 85 86  Temp: 36.6 C 36.7 C  Resp: 13 20    Last Pain:  Filed Vitals:   07/02/15 1549  PainSc: 2                  Kanija Remmel COKER

## 2015-07-02 NOTE — Op Note (Signed)
07/02/2015  2:17 PM  PATIENT:  Lisa Stephens  60 y.o. female  PRE-OPERATIVE DIAGNOSIS: Lumbosacral spondylosis with radiculopathy, lumbosacral spondylolisthesis L4-5, decompression of the bilateral L3, L4, and L5 nerves greater than what was required to implant the interbody spacers.  POST-OPERATIVE DIAGNOSIS: Same  PROCEDURE: L3-4, L4-5 posterior lumbar interbody fusion with BMP, pedicle screw fixation L3, L4, and L5 bilaterally  SURGEON: Aldean Ast, MD  ASSISTANTS: Ashok Pall, MD  ANESTHESIA: General  DRAINS: Medium Hemovac  SPECIMEN: None  INDICATION FOR PROCEDURE: 60 year old female with back and leg pain. She has failed to improve with conservative treatment and I have recommended L3-4, 4-5 PLIF. Patient understood the risks, benefits, and alternatives and potential outcomes and wished to proceed.  PROCEDURE DETAILS: After smooth induction of general endotracheal anesthesia the patient was turned prone on the [place] table. The skin of the lumbar area was clipped of hair and wiped with alcohol. It was prepped with Betadine scrub and chlorhexidine. Sterile drapes were applied. The planned incision was injected with a mixture of lidocaine and Marcaine with epinephrine.  The skin was opened sharply and a subperiosteal dissection was performed to expose the lateral edges of the lamina from L3-L5. A Nuvasive MAS PLIF retractor was inserted into the field and opened. Using AP and lateral fluoroscopy cortical trajectory screws were placed at L3, L4 and L5 bilaterally. A wide decompression was performed at L3-4 and L4-5. The exiting L3, L4, and L5 nerve roots were identified and found to to be adequately decompressed at this point.  At L3-4 an annulotomy was made bilaterally. Using sequentially larger disc shavers this space was expanded. Disc material was removed with shavers, curettes, and the bone was decorticated with a rasp. The interbody space was packed with a  combination Infuse and beta tricalcium phosphate and two peek spacers with 8 of lordosis were inserted into the interbody space. This process was repeated at L4-L5.  A lordotic rod was inserted and secured in the screw caps. Final tightening with a torque wrench was performed at all levels. Meticulous hemostasis was obtained. The wound was irrigated with bacitracin saline. A medium Hemovac drain was placed below the fascia. 1g of vancomycin powder was inserted into the wound. The wound was closed in layers with interrupted Vicryl sutures. Dermabond was used on the skin.  PATIENT DISPOSITION:  PACU - hemodynamically stable.   Delay start of Pharmacological VTE agent (>24hrs) due to surgical blood loss or risk of bleeding:  yes

## 2015-07-02 NOTE — Anesthesia Procedure Notes (Signed)
Procedure Name: Intubation Date/Time: 07/02/2015 9:25 AM Performed by: Scheryl Darter Pre-anesthesia Checklist: Patient identified, Emergency Drugs available, Suction available, Patient being monitored and Timeout performed Patient Re-evaluated:Patient Re-evaluated prior to inductionOxygen Delivery Method: Circle system utilized Preoxygenation: Pre-oxygenation with 100% oxygen Intubation Type: IV induction Ventilation: Mask ventilation without difficulty Laryngoscope Size: Mac and 3 Grade View: Grade I Tube type: Oral Tube size: 7.5 mm Number of attempts: 1 Airway Equipment and Method: Stylet Placement Confirmation: ETT inserted through vocal cords under direct vision,  positive ETCO2 and breath sounds checked- equal and bilateral Secured at: 22 cm Tube secured with: Tape Dental Injury: Teeth and Oropharynx as per pre-operative assessment

## 2015-07-03 ENCOUNTER — Encounter (HOSPITAL_COMMUNITY): Payer: Self-pay | Admitting: Neurological Surgery

## 2015-07-03 LAB — GLUCOSE, CAPILLARY
GLUCOSE-CAPILLARY: 124 mg/dL — AB (ref 65–99)
GLUCOSE-CAPILLARY: 145 mg/dL — AB (ref 65–99)
GLUCOSE-CAPILLARY: 145 mg/dL — AB (ref 65–99)
Glucose-Capillary: 145 mg/dL — ABNORMAL HIGH (ref 65–99)

## 2015-07-03 LAB — BASIC METABOLIC PANEL
Anion gap: 9 (ref 5–15)
BUN: 10 mg/dL (ref 6–20)
CO2: 24 mmol/L (ref 22–32)
CREATININE: 0.72 mg/dL (ref 0.44–1.00)
Calcium: 8.5 mg/dL — ABNORMAL LOW (ref 8.9–10.3)
Chloride: 105 mmol/L (ref 101–111)
Glucose, Bld: 114 mg/dL — ABNORMAL HIGH (ref 65–99)
Potassium: 3.8 mmol/L (ref 3.5–5.1)
SODIUM: 138 mmol/L (ref 135–145)

## 2015-07-03 LAB — CBC
HCT: 31.6 % — ABNORMAL LOW (ref 36.0–46.0)
Hemoglobin: 10.3 g/dL — ABNORMAL LOW (ref 12.0–15.0)
MCH: 28.7 pg (ref 26.0–34.0)
MCHC: 32.6 g/dL (ref 30.0–36.0)
MCV: 88 fL (ref 78.0–100.0)
PLATELETS: 254 10*3/uL (ref 150–400)
RBC: 3.59 MIL/uL — AB (ref 3.87–5.11)
RDW: 13.9 % (ref 11.5–15.5)
WBC: 12 10*3/uL — ABNORMAL HIGH (ref 4.0–10.5)

## 2015-07-03 MED ORDER — PANTOPRAZOLE SODIUM 40 MG PO TBEC
40.0000 mg | DELAYED_RELEASE_TABLET | Freq: Every day | ORAL | Status: DC
  Administered 2015-07-03: 40 mg via ORAL
  Filled 2015-07-03: qty 1

## 2015-07-03 MED ORDER — ACETAMINOPHEN 500 MG PO TABS: 1000.0000 mg | ORAL_TABLET | Freq: Four times a day (QID) | ORAL | Status: DC

## 2015-07-03 MED ORDER — ONDANSETRON HCL 4 MG PO TABS
4.0000 mg | ORAL_TABLET | Freq: Three times a day (TID) | ORAL | Status: DC | PRN
  Administered 2015-07-03: 4 mg via ORAL
  Filled 2015-07-03: qty 1

## 2015-07-03 MED ORDER — OXYCODONE HCL ER 20 MG PO T12A: 20.0000 mg | EXTENDED_RELEASE_TABLET | Freq: Two times a day (BID) | ORAL | Status: DC

## 2015-07-03 MED ORDER — DIAZEPAM 5 MG PO TABS: 5.0000 mg | ORAL_TABLET | Freq: Three times a day (TID) | ORAL | Status: DC | PRN

## 2015-07-03 MED ORDER — DOCUSATE SODIUM 100 MG PO CAPS: 100.0000 mg | ORAL_CAPSULE | Freq: Two times a day (BID) | ORAL | Status: DC

## 2015-07-03 MED ORDER — OXYCODONE HCL 5 MG PO TABS: 5.0000 mg | ORAL_TABLET | ORAL | Status: DC | PRN

## 2015-07-03 MED ORDER — OXYCODONE-ACETAMINOPHEN 7.5-325 MG PO TABS: 1.0000 | ORAL_TABLET | ORAL | Status: DC | PRN

## 2015-07-03 MED ORDER — BISACODYL 5 MG PO TBEC: 5.0000 mg | DELAYED_RELEASE_TABLET | Freq: Every day | ORAL | Status: DC | PRN

## 2015-07-03 MED ORDER — GABAPENTIN 100 MG PO CAPS: 200.0000 mg | ORAL_CAPSULE | Freq: Three times a day (TID) | ORAL | Status: DC

## 2015-07-03 MED ORDER — METHOCARBAMOL 750 MG PO TABS: 750.0000 mg | ORAL_TABLET | Freq: Four times a day (QID) | ORAL | Status: DC | PRN

## 2015-07-03 NOTE — Progress Notes (Deleted)
Date of Admission: 07/02/15  Date of Discharge: 07/04/15  Admission Diagnosis: Lumbosacral spondylosis with radiculopathy, lumbosacral spondylolisthesis  Discharge Diagnosis: Same  Procedure Performed: L3-4, L4-5 posterior lumbar interbody fusion  Attending: [facility] Course:  The patient was admitted on the morning of surgery for the above listed operation.  She tolerated this well.  She had an uneventful post-operative course and was discharged home in stable medical and neurological condition on POD 2.  Discharged Medications: Oxycontin, oxycodone 5mg , robaxin, neurontin, valium, resume prior meds  Follow up: With me in 2 weeks

## 2015-07-03 NOTE — Evaluation (Signed)
Occupational Therapy Evaluation Patient Details Name: Lisa Stephens MRN: [id number] DOB: 1956/04/05 Today's Date: 07/03/2015    History of Present Illness 60 y.o. s/p L3-4, L4-5 posterior lumbar interbody fusion with BMP, pedicle screw fixation L3, L4, and L5 bilaterally. PMH includes HTN, depression, arthritis, DM, hypothyroidism, SOB-dyspnea.   Clinical Impression   Pt s/p above. Pt independent with ADLs, PTA. Feel pt will benefit from acute OT to increase independence prior to d/c. Plan to see pt for session tomorrow prior to d/c.     Follow Up Recommendations  No OT follow up;Supervision - Intermittent    Equipment Recommendations  3 in 1 bedside comode;Other (comment) (RW-2 wheels)    Recommendations for Other Services       Precautions / Restrictions Precautions Precautions: Back;Fall Precaution Booklet Issued: No Precaution Comments: educated on back precautions Required Braces or Orthoses: Spinal Brace Spinal Brace:  (already on in session; doffed in sitting) Restrictions Weight Bearing Restrictions: No      Mobility Bed Mobility Overal bed mobility: Needs Assistance Bed Mobility: Rolling;Sit to Sidelying Rolling: Supervision       Sit to sidelying: Mod assist General bed mobility comments: assist with LE and upper body when going to sidelying position.   Transfers Overall transfer level: Needs assistance Equipment used: 1 person hand held assist Transfers: Sit to/from Stand Sit to Stand: Min assist              Balance    Mod assist for ambulation.                                        ADL Overall ADL's : Needs assistance/impaired                 Upper Body Dressing : Set up;Supervision/safety;Sitting   Lower Body Dressing: Moderate assistance;Sit to/from stand   Toilet Transfer: Moderate assistance;Ambulation (sit to stand from chair-Min assist; Mod A-ambulation)           Functional mobility during ADLs:  Moderate assistance (hand held assist) General ADL Comments: Discussed incorporating precautions into functional activities. Discussed back brace. Educated on AE/LB ADL technique. discussed sitting time recommendation. Discussed 3 in 1. Educated on what pt could use for toilet aid if needed.     Vision     Perception     Praxis      Pertinent Vitals/Pain Pain Assessment: 0-10 Pain Score: 9  Pain Location: back Pain Descriptors / Indicators: Sore Pain Intervention(s): Monitored during session;Repositioned     Hand Dominance     Extremity/Trunk Assessment Upper Extremity Assessment Upper Extremity Assessment: Overall WFL for tasks assessed   Lower Extremity Assessment Lower Extremity Assessment: Defer to PT evaluation       Communication Communication Communication: No difficulties   Cognition Arousal/Alertness: Awake/alert Behavior During Therapy: WFL for tasks assessed/performed;Flat affect Overall Cognitive Status: Within Functional Limits for tasks assessed                     General Comments       Exercises       Shoulder Instructions      Home Living Family/patient expects to be discharged to:: Private residence Living Arrangements: Spouse/significant other Available Help at Discharge: Family;Available PRN/intermittently Type of Home: House Home Access: Level entry     Home Layout: Two level Alternate Level Stairs-Number of Steps: 2-3 from den to [employer] and  bedrooms   Bathroom Shower/Tub: Advertising copywriter: Yes   Home Equipment: None          Prior Functioning/Environment Level of Independence: Independent             OT Diagnosis: Acute pain   OT Problem List: Pain;Decreased knowledge of precautions;Decreased knowledge of use of DME or AE;Impaired balance (sitting and/or standing);Decreased activity tolerance;Decreased strength;Decreased range of motion   OT  Treatment/Interventions: Self-care/ADL training;DME and/or AE instruction;Therapeutic activities;Patient/family education;Balance training    OT Goals(Current goals can be found in the care plan section) Acute Rehab OT Goals Patient Stated Goal: not stated OT Goal Formulation: With patient Time For Goal Achievement: 07/10/15 Potential to Achieve Goals: Good ADL Goals Pt Will Perform Lower Body Dressing: with supervision;with set-up;sit to/from stand (with or without AE) Pt Will Transfer to Toilet: with supervision;with set-up;ambulating (3 in 1 over commode) Pt Will Perform Toileting - Clothing Manipulation and hygiene: sit to/from stand;with set-up;with supervision Pt Will Perform Tub/Shower Transfer: Tub transfer;with min guard assist;ambulating;3 in 1;rolling walker Additional ADL Goal #1: Pt will independently verbalize 3/3 back precautions and maintain during session. Additional ADL Goal #2: Pt will don/doff back brace with set up assist.  OT Frequency: Min 2X/week   Barriers to D/C:            Co-evaluation              End of Session Equipment Utilized During Treatment: Gait belt;Back brace;Other (comment) (AE) Nurse Communication: Mobility status;Other (comment) (equipment recommendations)  Activity Tolerance: Other (comment) (dizzy upon standing; vomiting earlier today) Patient left: in bed;with call bell/phone within reach;with SCD's reapplied;with family/visitor present   Time: [id number] OT Time Calculation (min): 20 min Charges:  OT General Charges $OT Visit: 1 Procedure OT Evaluation $OT Eval Moderate Complexity: 1 Procedure G-CodesBenito Mccreedy OTR/L [id number]  07/03/2015, 10:01 AM

## 2015-07-03 NOTE — Progress Notes (Signed)
No acute events. Some vomiting this morning after ambulating.  Minimal pain. AVSS Awake and alert Full strength in legs Drain 50cc Stable and doing well Likely home tomorrow D/c drain

## 2015-07-03 NOTE — Discharge Summary (Signed)
Date of Admission: 07/02/15  Date of Discharge: 07/04/15  Admission Diagnosis: Lumbosacral spondylosis with radiculopathy, lumbosacral spondylolisthesis  Discharge Diagnosis: Same  Procedure Performed: L3-4, L4-5 posterior lumbar interbody fusion  Attending: [facility] Course:  The patient was admitted on the morning of surgery for the above listed operation. She tolerated this well. She had an uneventful post-operative course and was discharged home in stable medical and neurological condition on POD 2.  Discharged Medications: Oxycontin, oxycodone 5mg , robaxin, neurontin, valium, resume prior meds  Follow up: With me in 2 weeks

## 2015-07-03 NOTE — Progress Notes (Signed)
Physical Therapy Evaluation   07/03/15 0907  PT Visit Information  Last PT Received On 07/03/15  Assistance Needed +1  History of Present Illness Pt is a 60 y/o female who presents s/p L3-L5 PLIF on 07/02/15.  Precautions  Precautions Fall;Back  Precaution Booklet Issued Yes (comment)  Precaution Comments Reviewed handout and pt was cued for precautions during functional mobility.   Required Braces or Orthoses Spinal Brace  Spinal Brace Lumbar corset;Applied in sitting position  Restrictions  Weight Bearing Restrictions No  Home Living  Family/patient expects to be discharged to: Private residence  Living Arrangements Spouse/significant other  Available Help at Discharge Family;Available PRN/intermittently  Type of Home House  Home Access Level entry  Home Layout Two level  Alternate Level Stairs-Number of Steps 2-3 from den to [employer] and bedrooms  Teacher, English as a foreign language Yes  Home Equipment None  Prior Function  Level of Independence Independent  Communication  Communication No difficulties  Pain Assessment  Pain Assessment Faces  Faces Pain Scale 4  Pain Location Back  Pain Descriptors / Indicators Operative site guarding;Aching  Pain Intervention(s) Limited activity within patient's tolerance;Monitored during session;Repositioned  Cognition  Arousal/Alertness Awake/alert  Behavior During Therapy WFL for tasks assessed/performed  Overall Cognitive Status Within Functional Limits for tasks assessed  Upper Extremity Assessment  Upper Extremity Assessment Defer to OT evaluation  Lower Extremity Assessment  Lower Extremity Assessment Generalized weakness  Bed Mobility  Overal bed mobility Needs Assistance  Bed Mobility Rolling;Sidelying to Sit  Rolling Supervision  Sidelying to sit Min guard  General bed mobility comments Hands-on assist as pt elevated trunk to full sitting position. VC's for sequencing and  technique for proper log roll.   Transfers  Overall transfer level Needs assistance  Equipment used Rolling walker (2 wheeled)  Transfers Sit to/from Stand  Sit to Stand Min guard;Min assist  General transfer comment Steadying assist as pt powers-up to full standing position. RW required for UE support.   Ambulation/Gait  Ambulation/Gait assistance Min guard  Ambulation Distance (Feet) 200 Feet  Assistive device Rolling walker (2 wheeled)  Gait Pattern/deviations Step-through pattern;Decreased stride length;Trunk flexed  General Gait Details Pt required hands-on guarding for safety during gait training. Generally slow and guarded. Pt motivated for distance and felt nauseated upon return to the room followed by projectile vomit while in standing.   Gait velocity Decreased  Gait velocity interpretation Below normal speed for age/gender  Balance  Overall balance assessment Needs assistance  Sitting-balance support Feet supported;No upper extremity supported  Sitting balance-Leahy Scale Fair  Standing balance support No upper extremity supported;During functional activity  Standing balance-Leahy Scale Poor  Standing balance comment UE support required for dynamic standing balance.   PT - End of Session  Equipment Utilized During Treatment Gait belt;Back brace  Activity Tolerance Patient limited by pain  Patient left in chair;with call bell/phone within reach;with family/visitor present  Nurse Communication Mobility status  PT Assessment  PT Therapy Diagnosis  Difficulty walking;Acute pain  PT Recommendation/Assessment Patient needs continued PT services  PT Problem List Decreased strength;Decreased range of motion;Decreased activity tolerance;Decreased balance;Decreased mobility;Decreased knowledge of use of DME;Decreased safety awareness;Decreased knowledge of precautions;Pain  PT Plan  PT Frequency (ACUTE ONLY) Min 5X/week  PT Treatment/Interventions (ACUTE ONLY) DME instruction;Gait  training;Stair training;Functional mobility training;Therapeutic activities;Therapeutic exercise;Neuromuscular re-education;Patient/family education  PT Recommendation  Follow Up Recommendations Outpatient PT;Supervision for mobility/OOB  PT equipment Rolling walker with 5" wheels;3in1 (PT)  Individuals Consulted  Consulted and Agree with Results and Recommendations Patient  Acute Rehab PT Goals  Patient Stated Goal Home when able  PT Goal Formulation With patient  Time For Goal Achievement 07/10/15  Potential to Achieve Goals Good  PT Time Calculation  PT Start Time (ACUTE ONLY) 0810  PT Stop Time (ACUTE ONLY) 0848  PT Time Calculation (min) (ACUTE ONLY) 38 min  PT General Charges  $$ ACUTE PT VISIT 1 Procedure  PT Evaluation  $PT Eval Moderate Complexity 1 Procedure  PT Treatments  $Gait Training 23-37 mins   Assessment: Pt admitted with above diagnosis. Pt currently with functional limitations due to the deficits listed below (see PT Problem List). At the time of PT eval pt was motivated for gait distance, however was limited by N/V during end of gait training. Pt will benefit from skilled PT to increase their independence and safety with mobility to allow discharge to the venue listed below.     Rolinda Roan, PT, DPT Acute Rehabilitation Services Pager: [phone]

## 2015-07-04 LAB — GLUCOSE, CAPILLARY: GLUCOSE-CAPILLARY: 142 mg/dL — AB (ref 65–99)

## 2015-07-04 MED FILL — Sodium Chloride IV Soln 0.9%: INTRAVENOUS | Qty: 1000 | Status: AC

## 2015-07-04 MED FILL — Heparin Sodium (Porcine) Inj 1000 Unit/ML: INTRAMUSCULAR | Qty: 30 | Status: AC

## 2015-07-04 NOTE — Progress Notes (Signed)
Pt doing well. Pt given D/C instructions with Rx's, verbal understanding was provided. Pt's incision is clean and dry with no sign of infection. Pt's IV was removed prior to D/C. Pt received rolling walker and 3-n-1 prior to D/C. Pt D/C'd home via wheelchair @ 1115 per MD order. Pt is stable @ D/C and has no other needs at this time. Holli Humbles, RN

## 2015-07-04 NOTE — Progress Notes (Signed)
Doing well Minimal pain AVSS Full strength Stable Ready for d/c

## 2015-07-04 NOTE — Progress Notes (Signed)
Physical Therapy Treatment Patient Details Name: Lisa Stephens MRN: [id number] DOB: [birth year] Today's Date: 07/04/2015    History of Present Illness 60 y.o. s/p L3-4, L4-5 posterior lumbar interbody fusion with BMP, pedicle screw fixation L3, L4, and L5 bilaterally. PMH includes HTN, depression, arthritis, DM, hypothyroidism, SOB-dyspnea.    PT Comments    Pt progressing towards physical therapy goals. Was able to perform transfers and ambulation with gross min guard assist for safety. Continues to complain of numbness/weakness in the RLE. Pt was able to negotiate 3 stairs utilizing sideways technique for BUE support. Min guard assist was again provided however noted some R knee buckling during. Will continue to follow.   Follow Up Recommendations  Outpatient PT;Supervision for mobility/OOB     Equipment Recommendations  Rolling walker with 5" wheels;3in1 (PT)    Recommendations for Other Services       Precautions / Restrictions Precautions Precautions: Back;Fall Precaution Booklet Issued: Yes (comment) Precaution Comments: Reviewed precautions during functional mobility. Pt able to recall 3/3 without cues.  Required Braces or Orthoses: Spinal Brace Spinal Brace: Lumbar corset;Applied in sitting position Restrictions Weight Bearing Restrictions: No    Mobility  Bed Mobility Overal bed mobility: Needs Assistance Bed Mobility: Rolling;Sidelying to Sit Rolling: Supervision Sidelying to sit: Min guard       General bed mobility comments: Hands-on assist as pt elevated trunk to full sitting position. VC's for sequencing and technique for proper log roll.   Transfers Overall transfer level: Needs assistance Equipment used: 1 person hand held assist Transfers: Sit to/from Stand Sit to Stand: Min assist         General transfer comment: Steadying assist as pt powers-up to full standing position. RW required for UE support. VC's provided for hand placement on seated surface  for safety.   Ambulation/Gait Ambulation/Gait assistance: Min guard Ambulation Distance (Feet): 200 Feet Assistive device: Rolling walker (2 wheeled) Gait Pattern/deviations: Step-through pattern;Decreased stride length;Trunk flexed Gait velocity: Decreased Gait velocity interpretation: Below normal speed for age/gender General Gait Details: Generally slow and guarded. Pt was able to ambulate with improved control of RLE however continues to report numbness. Occasional instability noted.    Stairs            Wheelchair Mobility    Modified Rankin (Stroke Patients Only)       Balance Overall balance assessment: Needs assistance Sitting-balance support: Feet supported;No upper extremity supported Sitting balance-Leahy Scale: Fair     Standing balance support: No upper extremity supported;During functional activity Standing balance-Leahy Scale: Fair Standing balance comment: Static standing EOB without UE support. Requires assist/support for dynamic activity.                     Cognition Arousal/Alertness: Awake/alert Behavior During Therapy: WFL for tasks assessed/performed;Flat affect Overall Cognitive Status: Within Functional Limits for tasks assessed                      Exercises      General Comments        Pertinent Vitals/Pain Pain Assessment: Faces Faces Pain Scale: Hurts little more Pain Location: Incisional site Pain Descriptors / Indicators: Operative site guarding;Discomfort Pain Intervention(s): Limited activity within patient's tolerance;Monitored during session;Repositioned    Home Living                      Prior Function            PT Goals (current goals can now  be found in the care plan section) Acute Rehab PT Goals Patient Stated Goal: not stated PT Goal Formulation: With patient Time For Goal Achievement: 07/10/15 Potential to Achieve Goals: Good Progress towards PT goals: Progressing toward goals     Frequency  Min 5X/week    PT Plan Current plan remains appropriate    Co-evaluation             End of Session Equipment Utilized During Treatment: Gait belt;Back brace Activity Tolerance: Patient tolerated treatment well Patient left: in chair;with call bell/phone within reach;with family/visitor present     Time: [id number] PT Time Calculation (min) (ACUTE ONLY): 24 min  Charges:  $Gait Training: 23-37 mins                    G Codes:      Rolinda Roan 2015-08-02, 8:21 AM   Rolinda Roan, PT, DPT Acute Rehabilitation Services Pager: [phone]

## 2015-07-04 NOTE — Progress Notes (Signed)
Occupational Therapy Treatment Patient Details Name: Lisa Stephens MRN: [id number] DOB: 08-08-1955 Today's Date: 07/04/2015    History of present illness 60 y.o. s/p L3-4, L4-5 posterior lumbar interbody fusion with BMP, pedicle screw fixation L3, L4, and L5 bilaterally. PMH includes HTN, depression, arthritis, DM, hypothyroidism, SOB-dyspnea.   OT comments  Pt progressing. Education provided in session to pt and her spouse. Plan is for pt to d/c today.  Follow Up Recommendations  No OT follow up;Supervision - Intermittent    Equipment Recommendations  3 in 1 bedside comode;Other (comment) (RW-2 wheels)    Recommendations for Other Services      Precautions / Restrictions Precautions Precautions: Back;Fall Precaution Booklet Issued: No Precaution Comments: reviewed back precautions; pt able to state all of them at end of session Required Braces or Orthoses: Spinal Brace Spinal Brace: Lumbar corset (already on in session-managed straps in sitting; doffed in sitting) Restrictions Weight Bearing Restrictions: No       Mobility Bed Mobility Overal bed mobility: Needs Assistance Bed Mobility: Rolling;Sit to Sidelying Rolling: Supervision     Sit to sidelying: Min assist General bed mobility comments: assist with LE when going to sidelying position. Cues given for bed mobility.  Transfers Overall transfer level: Needs assistance Equipment used: Rolling walker (2 wheeled) Transfers: Sit to/from Stand Sit to Stand: Min guard         General transfer comment: cue for hand placement    Balance Assist given for tub transfer-pt unsteady. Min guard for ambulation with RW.                ADL Overall ADL's : Needs assistance/impaired                 Upper Body Dressing : Sitting;Supervision/safety Upper Body Dressing Details (indicate cue type and reason): managed straps on back brace; doffed house coat Lower Body Dressing: Set up;Supervision/safety;With adaptive  equipment;Sitting/lateral leans Lower Body Dressing Details (indicate cue type and reason): donned/doffed sock Toilet Transfer: Min guard;Ambulation;RW (sit to stand from chair)       Tub/ Shower Transfer: Tub transfer;Moderate assistance;Ambulation (used RW to ambulate up to close to simulated tub)   Functional mobility during ADLs:  (Mod assist-simulated tub transfer; Min guard-ambulating) General ADL Comments: discussed incorporating precautions into functional activities. Educated on safety such as rugs/items on floor, safe footwear. Educated on tub transfer techniques-tried stepping over simulated tub, and recommended pt not do this yet and educated on alternative techniques. Talked about back brace. Educated on AE and pt practiced with reacher and sock aid. Educated on car transfer technique.       Vision                     Perception     Praxis      Cognition  Awake/Alert Behavior During Therapy: WFL for tasks assessed/performed;Flat affect Overall Cognitive Status: Within Functional Limits for tasks assessed                       Extremity/Trunk Assessment               Exercises     Shoulder Instructions       General Comments      Pertinent Vitals/ Pain       Pain Assessment: 0-10 Pain Score: 3  Pain Location: back-sore and foot numb Pain Descriptors / Indicators: Numbness;Sore Pain Intervention(s): Monitored during session;Repositioned  Home Living  Prior Functioning/Environment              Frequency Min 2X/week     Progress Toward Goals  OT Goals(current goals can now be found in the care plan section)  Progress towards OT goals: Progressing toward goals  Acute Rehab OT Goals Patient Stated Goal: go home OT Goal Formulation: With patient Time For Goal Achievement: 07/10/15 Potential to Achieve Goals: Good ADL Goals Pt Will Perform Lower Body Dressing: with  supervision;with set-up;sit to/from stand (with or without AE) Pt Will Transfer to Toilet: with supervision;with set-up;ambulating (3 in 1 over commode) Pt Will Perform Toileting - Clothing Manipulation and hygiene: sit to/from stand;with set-up;with supervision Pt Will Perform Tub/Shower Transfer: Tub transfer;with min guard assist;ambulating;3 in 1;rolling walker Additional ADL Goal #1: Pt will independently verbalize 3/3 back precautions and maintain during session. Additional ADL Goal #2: Pt will don/doff back brace with set up assist.  Plan Discharge plan remains appropriate    Co-evaluation                 End of Session Equipment Utilized During Treatment: Gait belt;Rolling walker;Back brace;Other (comment) (AE)   Activity Tolerance Patient tolerated treatment well   Patient Left in bed;with call bell/phone within reach   Nurse Communication          Time: [phone] (OT waiting as MD talked with pt- 2 min?)  OT Time Calculation (min): 30 min  Charges: OT General Charges $OT Visit: 1 Procedure OT Treatments $Self Care/Home Management : 23-37 mins  Benito Mccreedy OTR/L [id number] 07/04/2015, 11:22 AM

## 2015-07-23 ENCOUNTER — Ambulatory Visit: Payer: BLUE CROSS/BLUE SHIELD | Attending: Neurological Surgery | Admitting: Physical Therapy

## 2015-07-23 DIAGNOSIS — M545 Low back pain, unspecified: Secondary | ICD-10-CM

## 2015-07-23 DIAGNOSIS — M6281 Muscle weakness (generalized): Secondary | ICD-10-CM

## 2015-07-23 NOTE — Therapy (Signed)
[facility] [place], [place], [address] Phone: [phone]   Fax:  [phone]  Physical Therapy Evaluation  Patient Details  Name: Lisa Stephens MRN: [id number] Date of Birth: 1956-02-08 Referring Provider: Cyndy Freeze MD.  Encounter Date: 07/23/2015      PT End of Session - 07/23/15 1617    Visit Number 1   Number of Visits 16   Date for PT Re-Evaluation 09/17/15   PT Start Time 0150   PT Stop Time 0239   PT Time Calculation (min) 49 min   Equipment Utilized During Treatment Back brace   Activity Tolerance Patient tolerated treatment well   Behavior During Therapy [facility] for tasks assessed/performed      Past Medical History  Diagnosis Date  . Hypertension   . Shortness of breath dyspnea   . Hypothyroidism   . Diabetes mellitus without complication ([place])   . Depression   . Arthritis     Past Surgical History  Procedure Laterality Date  . Abdominal hysterectomy    . Tubal ligation    . Maximum access (mas)posterior lumbar interbody fusion (plif) 2 level N/A 07/02/2015    Procedure: L3-4 L4-5 Maximum access posterior lumbar interbody fusion;  Surgeon: Kevan Ny Ditty, MD;  Location: [place] [facility];  Service: Neurosurgery;  Laterality: N/A;  L3-4 L4-5 Maximum access posterior lumbar interbody fusion    There were no vitals filed for this visit.       Subjective Assessment - 07/23/15 1357    Subjective Surgery has definitely helped.   Limitations Walking;Sitting;Standing   How long can you sit comfortably? 20 minutes.   How long can you stand comfortably? 10 minutes.   How long can you walk comfortably? Short distances.   Pain Score 4    Pain Location Back   Pain Orientation Right;Left;Lower   Pain Descriptors / Indicators Aching   Pain Type Surgical pain   Pain Frequency Constant            [facility] PT Assessment - 07/23/15 0001    Assessment   Medical Diagnosis Spondylolisthesis, lumbosacrall  region.   Referring Provider Cyndy Freeze MD.   Onset Date/Surgical Date --  07/02/15 (surgery date).   Precautions   Precautions --  Please follow lumbar fusion protocol.   Required Braces or Orthoses --  Lumbar brace donned.   Restrictions   Weight Bearing Restrictions No   Balance Screen   Has the patient fallen in the past 6 months No   Has the patient had a decrease in activity level because of a fear of falling?  No   Is the patient reluctant to leave their home because of a fear of falling?  No   Home Ec[employer] residence   Prior Function   Level of Independence Independent   Vocation Full time employment   Posture/Postural Control   Posture/Postural Control Postural limitations   Postural Limitations Decreased lumbar lordosis   ROM / Strength   AROM / PROM / Strength AROM;Strength   AROM   Overall AROM Comments Bilateral Passive hip flexion= 105 degrees.   Strength   Overall Strength Comments Right ankle dorsiflexion= 2+ to 3-/5.   Palpation   Palpation comment Minimal+ tenderness on either side of the patient's lumbar incisional site.  CC is referred pain into bilateral LE's and foot numbess.   Transfers   Comments Sit to stand with arm use from chair.   Ambulation/Gait   Gait Pattern  Decreased stance time - right;Decreased dorsiflexion - right;Antalgic   Gait Comments patient ambulates with a FWW and her lumbar brace donned.                   [facility] Adult PT Treatment/Exercise - 07/23/15 0001    Modalities   Modalities Electrical Stimulation;Moist Heat   Moist Heat Therapy   Number Minutes Moist Heat 20 Minutes   Moist Heat Location --  Low back.   Acupuncturist Location --  Bilateral lower lumbar region.   Electrical Stimulation Action Pre-mod e'stim   Electrical Stimulation Parameters constant at 80-150 Hz. x 20 minutes.   Electrical Stimulation Goals Pain                      PT Long Term Goals - 07/23/15 1642    PT LONG TERM GOAL #1   Title Ind with a HEP.   Time 8   Period Weeks   Status New   PT LONG TERM GOAL #2   Title Sit 30 minutes with pain not > 3/10.   Time 8   Period Weeks   Status New   PT LONG TERM GOAL #3   Title Stand 30 minutes with pain not > 3/10.   Time 8   Period Weeks   Status New   PT LONG TERM GOAL #4   Title Sleep 6 hours undisturbed.   Time 8   Period Weeks   Status New   PT LONG TERM GOAL #5   Title Walk a community distance without assistive device.   Time 8   Period Weeks   PT LONG TERM GOAL #6   Title Increase right ankle dorsiflexion strength to 4/5 to increase toe clearance while walking.   Time 8   Period Weeks   Status New               Plan - 07/23/15 1624    Clinical Impression Statement The patient underwent a lumbar fusion surgery at level L3-4 and L4-5 on 07/02/15.  She is pleased with her progress thus far with a notable reduction in her bilateral LE pain.  She is still experiencing symtoms with c/o numbness in feet right > left but is doing well.  She states her pain is worst at night and is awakened by pain rated at a 7-8/10 at which time she takes her medication.  Her pain-level today is a 4/10.  She is wearing her back brace and is using a FWW today.  She is motivated to improve and is doing light housework.  She states she bends at the knees when doing such things as making her bed.  Prologed standing and sitting increase her pain.   Rehab Potential Good   PT Frequency 2x / week   PT Duration 8 weeks   PT Treatment/Interventions ADLs/Self Care Home Management;Electrical Stimulation;Moist Heat;Therapeutic exercise;Therapeutic activities;Gait training;Neuromuscular re-education;Patient/family education;Manual techniques;Passive range of motion   PT Next Visit Plan Please see lumbar fusion protocol.  Gentle SKTC and begin draw-in progression in supine; Nustep; right  ankle strengthening; Parallel bar activites.   Consulted and Agree with Plan of Care Patient      Patient will benefit from skilled therapeutic intervention in order to improve the following deficits and impairments:  Pain, Decreased activity tolerance, Decreased mobility, Decreased range of motion, Decreased strength  Visit Diagnosis: Bilateral low back pain without sciatica - Plan: PT plan of care cert/re-cert  Muscle weakness (generalized) - Plan: PT plan of care cert/re-cert     Problem List Patient Active Problem List   Diagnosis Date Noted  . Lumbosacral spondylosis with radiculopathy 07/02/2015    Kyarra Vancamp, [place] MPT 07/23/2015, 4:48 PM  [facility] [place], [place], [address] Phone: [phone]   Fax:  [phone]  Name: Nyelah Valazquez MRN: [id number] Date of Birth: 1955-08-18

## 2015-07-26 ENCOUNTER — Ambulatory Visit: Payer: BLUE CROSS/BLUE SHIELD | Admitting: Physical Therapy

## 2015-07-26 ENCOUNTER — Encounter: Payer: Self-pay | Admitting: Physical Therapy

## 2015-07-26 DIAGNOSIS — M6281 Muscle weakness (generalized): Secondary | ICD-10-CM

## 2015-07-26 DIAGNOSIS — M545 Low back pain, unspecified: Secondary | ICD-10-CM

## 2015-07-26 NOTE — Therapy (Signed)
[facility] [place], [place], [address] Phone: [phone]   Fax:  [phone]  Physical Therapy Treatment  Patient Details  Name: Lisa Stephens MRN: [id number] Date of Birth: 05-26-1955 Referring Provider: Cyndy Freeze MD.  Encounter Date: 07/26/2015      PT End of Session - 07/26/15 1254    Visit Number 2   Number of Visits 16   Date for PT Re-Evaluation 09/17/15   PT Start Time [id number]   PT Stop Time 1353   PT Time Calculation (min) 50 min   Equipment Utilized During Treatment Back brace;Other (comment)  FWW   Activity Tolerance Patient tolerated treatment well   Behavior During Therapy WFL for tasks assessed/performed      Past Medical History  Diagnosis Date  . Hypertension   . Shortness of breath dyspnea   . Hypothyroidism   . Diabetes mellitus without complication ([place])   . Depression   . Arthritis     Past Surgical History  Procedure Laterality Date  . Abdominal hysterectomy    . Tubal ligation    . Maximum access (mas)posterior lumbar interbody fusion (plif) 2 level N/A 07/02/2015    Procedure: L3-4 L4-5 Maximum access posterior lumbar interbody fusion;  Surgeon: Kevan Ny Ditty, MD;  Location: [facility];  Service: Neurosurgery;  Laterality: N/A;  L3-4 L4-5 Maximum access posterior lumbar interbody fusion    There were no vitals filed for this visit.      Subjective Assessment - 07/26/15 1254    Subjective Denies any back pain today and denies any pain or symptoms in LEs. Now sleeping 4-5 hours consecutively and waking due to pain. States that she is trying to wean herself from the pain medication.   Limitations Walking;Sitting;Standing   How long can you sit comfortably? 20 minutes.   How long can you stand comfortably? 10 minutes.   How long can you walk comfortably? Short distances.   Currently in Pain? No/denies            [facility] PT Assessment - 07/26/15 0001    Assessment   Medical Diagnosis Spondylolisthesis, lumbosacrall region.   Onset Date/Surgical Date 07/02/15   Next MD Visit 08/16/2015   Restrictions   Weight Bearing Restrictions No                     [facility] Adult PT Treatment/Exercise - 07/26/15 0001    Exercises   Exercises Lumbar;Ankle   Lumbar Exercises: Stretches   Single Knee to Chest Stretch 3 reps;30 seconds;Other (comment)  BLE   Lumbar Exercises: Aerobic   Stationary Bike NuStep L4 x12 min with VCs for core activation and proper posture   Lumbar Exercises: Supine   Ab Set 20 reps;5 seconds   Other Supine Lumbar Exercises B ball squeeze x10 reps  Quick sharp pain experienced in mid low back   Other Supine Lumbar Exercises B SAQ 3# x20 reps each   Modalities   Modalities Electrical Stimulation;Moist Heat   Moist Heat Therapy   Number Minutes Moist Heat 15 Minutes   Moist Heat Location Lumbar Spine   Electrical Stimulation   Electrical Stimulation Location B lumbar paraspinals   Electrical Stimulation Action Pre-Mod   Electrical Stimulation Parameters 80-150 Hz x15 min   Electrical Stimulation Goals Pain   Ankle Exercises: Supine   Other Supine Ankle Exercises B ankle pumps x20 reps                PT  Education - 07/26/15 1343    Education provided Yes   Education Details Education regarding logrolling technique and its importance as to not reinjure herself.   Person(s) Educated Patient   Methods Explanation;Demonstration;Tactile cues;Verbal cues;Handout   Comprehension Verbalized understanding;Returned demonstration;Verbal cues required;Tactile cues required             PT Long Term Goals - 07/23/15 1642    PT LONG TERM GOAL #1   Title Ind with a HEP.   Time 8   Period Weeks   Status New   PT LONG TERM GOAL #2   Title Sit 30 minutes with pain not > 3/10.   Time 8   Period Weeks   Status New   PT LONG TERM GOAL #3   Title Stand 30 minutes with pain not > 3/10.   Time 8   Period Weeks    Status New   PT LONG TERM GOAL #4   Title Sleep 6 hours undisturbed.   Time 8   Period Weeks   Status New   PT LONG TERM GOAL #5   Title Walk a community distance without assistive device.   Time 8   Period Weeks   PT LONG TERM GOAL #6   Title Increase right ankle dorsiflexion strength to 4/5 to increase toe clearance while walking.   Time 8   Period Weeks   Status New               Plan - 07/26/15 1344    Clinical Impression Statement Patient tolerated today's treatment well with no reports of pain other than quick sharp mid low back pain during supine adductor squeeze. Patient recieved VCs regarding core activation technique and proper posture while on NuStep machine. Patient required VCs regarding core activation for supine lumbar/core exercises as well. Completed all exercises as directed with minimal to moderate multimodal cueing for exercise technique. Mid low back pain that patient experienced with supine adductor squeeze relieved quickly per patient report. Patient was also educated regarding reports of pain and the importance. Patient required moderate to max multimodal cueing for proper logrolling technique getting into and out of bed. Patient demonstrated difficulty in lowering down to or raising up from elbow as well as turning to sidelying. Patient was also educated regarding the importance of log rolling as to not reinjure herself. Normal modalites response noted following removal of the modalities. Patient experienced "slight" low back pain upon end of treatment.   Rehab Potential Good   PT Frequency 2x / week   PT Duration 8 weeks   PT Treatment/Interventions ADLs/Self Care Home Management;Electrical Stimulation;Moist Heat;Therapeutic exercise;Therapeutic activities;Gait training;Neuromuscular re-education;Patient/family education;Manual techniques;Passive range of motion   PT Next Visit Plan Please see lumbar fusion protocol.  Gentle SKTC and begin draw-in progression  in supine; Nustep; right ankle strengthening; Parallel bar activites.   PT Home Exercise Plan Education for logrolling   Consulted and Agree with Plan of Care Patient      Patient will benefit from skilled therapeutic intervention in order to improve the following deficits and impairments:  Pain, Decreased activity tolerance, Decreased mobility, Decreased range of motion, Decreased strength  Visit Diagnosis: Bilateral low back pain without sciatica  Muscle weakness (generalized)     Problem List Patient Active Problem List   Diagnosis Date Noted  . Lumbosacral spondylosis with radiculopathy 07/02/2015    [name], PTA 07/26/2015, 2:02 PM  [facility] [address] [place], [place], [address] Phone: [phone]  Fax:  [phone]  Name: Lisa Stephens MRN: [id number] Date of Birth: 10-Dec-1955

## 2015-07-30 ENCOUNTER — Encounter: Payer: Self-pay | Admitting: Physical Therapy

## 2015-07-30 ENCOUNTER — Ambulatory Visit: Payer: BLUE CROSS/BLUE SHIELD | Admitting: Physical Therapy

## 2015-07-30 DIAGNOSIS — M545 Low back pain, unspecified: Secondary | ICD-10-CM

## 2015-07-30 DIAGNOSIS — M6281 Muscle weakness (generalized): Secondary | ICD-10-CM

## 2015-07-30 NOTE — Therapy (Signed)
[facility] [place], [place], [address] Phone: [phone]   Fax:  [phone]  Physical Therapy Treatment  Patient Details  Name: Lisa Stephens MRN: [id number] Date of Birth: Jul 27, 1955 Referring Provider: Cyndy Freeze MD.  Encounter Date: 07/30/2015      PT End of Session - 07/30/15 0943    Visit Number 3   Number of Visits 16   Date for PT Re-Evaluation 09/17/15   PT Start Time 0946   PT Stop Time 1036   PT Time Calculation (min) 50 min   Equipment Utilized During Treatment Back brace   Activity Tolerance Patient tolerated treatment well   Behavior During Therapy [facility] for tasks assessed/performed      Past Medical History  Diagnosis Date  . Hypertension   . Shortness of breath dyspnea   . Hypothyroidism   . Diabetes mellitus without complication ([place])   . Depression   . Arthritis     Past Surgical History  Procedure Laterality Date  . Abdominal hysterectomy    . Tubal ligation    . Maximum access (mas)posterior lumbar interbody fusion (plif) 2 level N/A 07/02/2015    Procedure: L3-4 L4-5 Maximum access posterior lumbar interbody fusion;  Surgeon: Kevan Ny Ditty, MD;  Location: [facility];  Service: Neurosurgery;  Laterality: N/A;  L3-4 L4-5 Maximum access posterior lumbar interbody fusion    There were no vitals filed for this visit.      Subjective Assessment - 07/30/15 0943    Subjective States that she took an Aleve prior to coming to therapy today. States that she was hurting a little upon waking but has gone two nights with prescription pain medication. Reports that RLE numbness is gone but R foot feels tight at times.   Limitations Walking;Sitting;Standing   How long can you sit comfortably? 20 minutes.   How long can you stand comfortably? 10 minutes.   How long can you walk comfortably? Short distances.   Currently in Pain? No/denies            [facility] PT Assessment - 07/30/15 0001     Assessment   Medical Diagnosis Spondylolisthesis, lumbosacrall region.   Onset Date/Surgical Date 07/02/15   Next MD Visit 08/16/2015   Restrictions   Weight Bearing Restrictions No                     [facility] Adult PT Treatment/Exercise - 07/30/15 0001    Lumbar Exercises: Stretches   Single Knee to Chest Stretch 3 reps;30 seconds;Other (comment)  BLE   Lumbar Exercises: Aerobic   Stationary Bike NuStep L4 x14 min with VCs for core activation and proper posture   Lumbar Exercises: Standing   Other Standing Lumbar Exercises Seated B AROM clamshell x20 reps   Lumbar Exercises: Supine   Ab Set 20 reps;5 seconds   Straight Leg Raise 20 reps;Other (comment)  BLE   Other Supine Lumbar Exercises B SAQ 3# x30 reps each   Modalities   Modalities Electrical Stimulation;Moist Heat   Moist Heat Therapy   Number Minutes Moist Heat 15 Minutes   Moist Heat Location Lumbar Spine   Electrical Stimulation   Electrical Stimulation Location B lumbar paraspinals   Electrical Stimulation Action Pre-Mod   Electrical Stimulation Parameters 80-150 Hz x15 min   Electrical Stimulation Goals Pain                PT Education - 07/30/15 1011    Education provided Yes  Education Details HEP- SKTC, ab set, SLR, ankle pumps.   Person(s) Educated Patient   Methods Explanation;Tactile cues;Verbal cues;Handout   Comprehension Verbalized understanding;Verbal cues required;Tactile cues required             PT Long Term Goals - 07/23/15 1642    PT LONG TERM GOAL #1   Title Ind with a HEP.   Time 8   Period Weeks   Status New   PT LONG TERM GOAL #2   Title Sit 30 minutes with pain not > 3/10.   Time 8   Period Weeks   Status New   PT LONG TERM GOAL #3   Title Stand 30 minutes with pain not > 3/10.   Time 8   Period Weeks   Status New   PT LONG TERM GOAL #4   Title Sleep 6 hours undisturbed.   Time 8   Period Weeks   Status New   PT LONG TERM GOAL #5   Title Walk a  community distance without assistive device.   Time 8   Period Weeks   PT LONG TERM GOAL #6   Title Increase right ankle dorsiflexion strength to 4/5 to increase toe clearance while walking.   Time 8   Period Weeks   Status New               Plan - 07/30/15 1026    Clinical Impression Statement Patient tolerated today's treatment well with only reports of "a little" increased low back pain with SLR today. Patient continues to require VCs regarding core activation for exercises. Completed all exercises with minimal to moderate multimodal cueing for exercise technique or corrections. Patient was observed as not adhering to log rolling technique and was reminded to use technique as to not reinjure low back. Ambulated into therapy clinic today with lumbar brace donned and without AD. Patient ambulates with an antalgic gait and trendelenburg noted as well. Patient accepted new HEP per protocol without questions and verbalized understanding. Normal modalities response noted following removal of the modalities.  Patient denied pain following today's treatment.   Rehab Potential Good   PT Frequency 2x / week   PT Duration 8 weeks   PT Treatment/Interventions ADLs/Self Care Home Management;Electrical Stimulation;Moist Heat;Therapeutic exercise;Therapeutic activities;Gait training;Neuromuscular re-education;Patient/family education;Manual techniques;Passive range of motion   PT Next Visit Plan Please see lumbar fusion protocol.  Gentle SKTC and begin draw-in progression in supine; Nustep; right ankle strengthening; Parallel bar activites.   PT Home Exercise Plan Education for logrolling; HEP- SKTC, ab set, SLR, ankle pumps   Consulted and Agree with Plan of Care Patient      Patient will benefit from skilled therapeutic intervention in order to improve the following deficits and impairments:  Pain, Decreased activity tolerance, Decreased mobility, Decreased range of motion, Decreased  strength  Visit Diagnosis: Bilateral low back pain without sciatica  Muscle weakness (generalized)     Problem List Patient Active Problem List   Diagnosis Date Noted  . Lumbosacral spondylosis with radiculopathy 07/02/2015    [name], PTA 07/30/2015, 10:45 AM  [facility] [place], [place], [address] Phone: [phone]   Fax:  [phone]  Name: Jodilyn Byard MRN: [id number] Date of Birth: 07-13-1955

## 2015-07-30 NOTE — Patient Instructions (Signed)
Pelvic Tilt    Flatten back by tightening stomach muscles and buttocks. REMEMBER TO BREATH DURING EXERCISE. Repeat _10___ times per set. Do _2___ sets per session. Do __2-3__ sessions per day. Hold 5 seconds.  http://orth.exer.us/134   Copyright  VHI. All rights reserved.  Knee-to-Chest Stretch: Unilateral    With hand behind right knee, pull knee in to chest until a comfortable stretch is felt in lower back and buttocks. Keep back relaxed. Hold __30__ seconds. Repeat _3___ times per set. Do __1__ sets per session. Do _2-3___ sessions per day. CAN BE COMPLETED FOR BOTH LEGS.  http://orth.exer.us/126   Copyright  VHI. All rights reserved.  Straight Leg Raise    Tighten stomach and slowly raise locked right/left leg _5___ inches from floor.  Repeat __10__ times per set. Do __2__ sets per session. Do __2-3__ sessions per day.  http://orth.exer.us/1102   Copyright  VHI. All rights reserved.  ROM: Plantar / Dorsiflexion    With left leg relaxed, gently flex and extend ankle. Move through full range of motion. Avoid pain. COMPLETE FOR BOTH LEGS. Repeat _10___ times per set. Do __2__ sets per session. Do __2-3__ sessions per day.  http://orth.exer.us/34   Copyright  VHI. All rights reserved.

## 2015-08-02 ENCOUNTER — Encounter: Payer: Self-pay | Admitting: Physical Therapy

## 2015-08-02 ENCOUNTER — Ambulatory Visit: Payer: BLUE CROSS/BLUE SHIELD | Admitting: Physical Therapy

## 2015-08-02 DIAGNOSIS — M545 Low back pain, unspecified: Secondary | ICD-10-CM

## 2015-08-02 DIAGNOSIS — M6281 Muscle weakness (generalized): Secondary | ICD-10-CM

## 2015-08-02 NOTE — Therapy (Signed)
[facility] [place], [place], [address] Phone: [phone]   Fax:  [phone]  Physical Therapy Treatment  Patient Details  Name: Lisa Stephens MRN: [id number] Date of Birth: [birth year] Referring Provider: Cyndy Freeze MD.  Encounter Date: 08/02/2015      PT End of Session - 08/02/15 1304    Visit Number 4   Number of Visits 16   Date for PT Re-Evaluation 09/17/15   PT Start Time 1302   PT Stop Time 1350   PT Time Calculation (min) 48 min   Equipment Utilized During Treatment Back brace   Activity Tolerance Patient tolerated treatment well   Behavior During Therapy [facility] for tasks assessed/performed      Past Medical History  Diagnosis Date  . Hypertension   . Shortness of breath dyspnea   . Hypothyroidism   . Diabetes mellitus without complication ([place])   . Depression   . Arthritis     Past Surgical History  Procedure Laterality Date  . Abdominal hysterectomy    . Tubal ligation    . Maximum access (mas)posterior lumbar interbody fusion (plif) 2 level N/A 07/02/2015    Procedure: L3-4 L4-5 Maximum access posterior lumbar interbody fusion;  Surgeon: Kevan Ny Ditty, MD;  Location: [facility];  Service: Neurosurgery;  Laterality: N/A;  L3-4 L4-5 Maximum access posterior lumbar interbody fusion    There were no vitals filed for this visit.      Subjective Assessment - 08/02/15 1303    Subjective Reports improvements everyday and denies any low back pain today.   Limitations Walking;Sitting;Standing   How long can you sit comfortably? 20 minutes.   How long can you stand comfortably? 10 minutes.   How long can you walk comfortably? Short distances.   Currently in Pain? No/denies            [facility] PT Assessment - 08/02/15 0001    Assessment   Medical Diagnosis Spondylolisthesis, lumbosacrall region.   Onset Date/Surgical Date 07/02/15   Next MD Visit 08/16/2015   Restrictions   Weight Bearing  Restrictions No                     [facility] Adult PT Treatment/Exercise - 08/02/15 0001    Lumbar Exercises: Aerobic   Stationary Bike Nustep L5 x12 min   Lumbar Exercises: Standing   Other Standing Lumbar Exercises B forward step up 6" x20 reps each 2 UE support   Other Standing Lumbar Exercises B seated row red theraband x20 reps   Lumbar Exercises: Seated   Long Arc Quad on Chair Strengthening;Both;2 sets;10 reps;Weights   LAQ on Chair Weights (lbs) 3   Lumbar Exercises: Supine   Ab Set 20 reps;5 seconds   Straight Leg Raise 20 reps;Other (comment)  BLE   Lumbar Exercises: Sidelying   Clam 20 reps;Other (comment)  BLE   Modalities   Modalities Electrical Stimulation;Moist Heat   Moist Heat Therapy   Number Minutes Moist Heat 15 Minutes   Moist Heat Location Lumbar Spine   Electrical Stimulation   Electrical Stimulation Location B lumbar paraspinals   Electrical Stimulation Action Pre-Mod   Electrical Stimulation Parameters 80-150 Hz x15 min   Electrical Stimulation Goals Pain                     PT Long Term Goals - 08/02/15 1312    PT LONG TERM GOAL #1   Title Ind with a HEP.  Time 8   Period Weeks   Status Achieved   PT LONG TERM GOAL #2   Title Sit 30 minutes with pain not > 3/10.   Time 8   Period Weeks   Status Achieved   PT LONG TERM GOAL #3   Title Stand 30 minutes with pain not > 3/10.   Time 8   Period Weeks   Status On-going   PT LONG TERM GOAL #4   Title Sleep 6 hours undisturbed.   Time 8   Period Weeks   Status On-going   PT LONG TERM GOAL #5   Title Walk a community distance without assistive device.   Time 8   Period Weeks   Status On-going   PT LONG TERM GOAL #6   Title Increase right ankle dorsiflexion strength to 4/5 to increase toe clearance while walking.   Time 8   Period Weeks   Status On-going               Plan - 08/02/15 1339    Clinical Impression Statement Patient tolerated today's  treatment well with only reports of "feeling it" with R clamshell. Had no reports of pain upon arrival at clinic and did well with advancement within protocol today. Completed exercises well following minimal  multimodal cueing for exercise technique. Lumbar brace donned during today's treatment and patient arrived without AD today. Gait observed with patient reaching for objects to hold onto and trendelenburg gait. Normal modalities response noted following removal of the modaliites. Patient remains inconsistant with log rolling technique today and was encouraged to use log rolling for transfers. Patient requires increased time for transfers such as supine to sidelying or sit to supine at this time. Patient achieved HEP and sitting goal today in clinc with other goals remaining on-going at this time secondary to standing intolerance, sleep disturbance, deficits with community ambulation and dorsiflexion weakness. Patient experienced discomfort with placement of moist heat pack.   Rehab Potential Good   PT Frequency 2x / week   PT Duration 8 weeks   PT Treatment/Interventions ADLs/Self Care Home Management;Electrical Stimulation;Moist Heat;Therapeutic exercise;Therapeutic activities;Gait training;Neuromuscular re-education;Patient/family education;Manual techniques;Passive range of motion   PT Next Visit Plan Continue per lumbar fusion protocol with modalites and gait training as needed per MPT POC.   PT Home Exercise Plan Education for logrolling; HEP- SKTC, ab set, SLR, ankle pumps   Consulted and Agree with Plan of Care Patient      Patient will benefit from skilled therapeutic intervention in order to improve the following deficits and impairments:  Pain, Decreased activity tolerance, Decreased mobility, Decreased range of motion, Decreased strength  Visit Diagnosis: Bilateral low back pain without sciatica  Muscle weakness (generalized)     Problem List Patient Active Problem List    Diagnosis Date Noted  . Lumbosacral spondylosis with radiculopathy 07/02/2015    [name], PTA 08/02/2015, 2:58 PM  [facility] [address] [place], [place], [address] Phone: [phone]   Fax:  [phone]  Name: Lisa Stephens MRN: [id number] Date of Birth: [birth year]

## 2015-08-06 ENCOUNTER — Ambulatory Visit: Payer: BLUE CROSS/BLUE SHIELD | Admitting: Physical Therapy

## 2015-08-07 ENCOUNTER — Ambulatory Visit: Payer: BLUE CROSS/BLUE SHIELD | Attending: Neurological Surgery | Admitting: *Deleted

## 2015-08-07 DIAGNOSIS — M6281 Muscle weakness (generalized): Secondary | ICD-10-CM | POA: Insufficient documentation

## 2015-08-07 DIAGNOSIS — M545 Low back pain, unspecified: Secondary | ICD-10-CM

## 2015-08-07 NOTE — Therapy (Signed)
[facility] [place], [place], [address] Phone: [phone]   Fax:  [phone]  Physical Therapy Treatment  Patient Details  Name: Lisa Stephens MRN: [id number] Date of Birth: [birth year] Referring Provider: Cyndy Freeze MD.  Encounter Date: 08/07/2015      PT End of Session - 08/07/15 1008    Visit Number 5   Number of Visits 16   Date for PT Re-Evaluation 09/17/15   PT Start Time 0945   PT Stop Time [id number]   PT Time Calculation (min) 59 min      Past Medical History  Diagnosis Date  . Hypertension   . Shortness of breath dyspnea   . Hypothyroidism   . Diabetes mellitus without complication ([place])   . Depression   . Arthritis     Past Surgical History  Procedure Laterality Date  . Abdominal hysterectomy    . Tubal ligation    . Maximum access (mas)posterior lumbar interbody fusion (plif) 2 level N/A 07/02/2015    Procedure: L3-4 L4-5 Maximum access posterior lumbar interbody fusion;  Surgeon: Kevan Ny Ditty, MD;  Location: [facility];  Service: Neurosurgery;  Laterality: N/A;  L3-4 L4-5 Maximum access posterior lumbar interbody fusion    There were no vitals filed for this visit.      Subjective Assessment - 08/07/15 1007    Subjective Reports improvements everyday and denies any low back pain today. Mainly RT Leg weakness   Limitations Walking;Sitting;Standing   How long can you sit comfortably? 20 minutes.   How long can you stand comfortably? 10 minutes.   How long can you walk comfortably? Short distances.   Currently in Pain? No/denies                         [facility] Adult PT Treatment/Exercise - 08/07/15 0001    Exercises   Exercises Lumbar;Ankle   Lumbar Exercises: Aerobic   Stationary Bike Nustep L5 x17  min   Lumbar Exercises: Standing   Other Standing Lumbar Exercises B forward step up 6" x20 reps each 2 UE support   Other Standing Lumbar Exercises B seated row red  theraband x20 reps   Lumbar Exercises: Supine   Ab Set 20 reps;5 seconds;10 reps   Bent Knee Raise 3 seconds  4x10   Modalities   Modalities Electrical Stimulation;Moist Heat   Moist Heat Therapy   Number Minutes Moist Heat 15 Minutes   Moist Heat Location Lumbar Spine   Electrical Stimulation   Electrical Stimulation Location B lumbar paraspinals,  Premod x 15 mins 80-150hz    Electrical Stimulation Goals Pain   Ankle Exercises: Standing   Rocker Board 5 minutes  DF/PF and calf stretching    LOG ROLL and transfers practiced.                 PT Long Term Goals - 08/02/15 1312    PT LONG TERM GOAL #1   Title Ind with a HEP.   Time 8   Period Weeks   Status Achieved   PT LONG TERM GOAL #2   Title Sit 30 minutes with pain not > 3/10.   Time 8   Period Weeks   Status Achieved   PT LONG TERM GOAL #3   Title Stand 30 minutes with pain not > 3/10.   Time 8   Period Weeks   Status On-going   PT LONG TERM GOAL #4   Title Sleep 6  hours undisturbed.   Time 8   Period Weeks   Status On-going   PT LONG TERM GOAL #5   Title Walk a community distance without assistive device.   Time 8   Period Weeks   Status On-going   PT LONG TERM GOAL #6   Title Increase right ankle dorsiflexion strength to 4/5 to increase toe clearance while walking.   Time 8   Period Weeks   Status On-going               Plan - 08/07/15 1009    Clinical Impression Statement Pt did fairly well with Rx today. She wore brace during exs and had only one episode of increased LBP and that was during Core  activation ex marching and it was only during the first set. We discussed the importance of getting her core muscles activated again to help with back  stabilization. We also practiced Log rolling again on large mat and she still needs some tactile and verbal cueing.   Rehab Potential Good   PT Frequency 2x / week   PT Duration 8 weeks   PT Treatment/Interventions ADLs/Self Care Home  Management;Electrical Stimulation;Moist Heat;Therapeutic exercise;Therapeutic activities;Gait training;Neuromuscular re-education;Patient/family education;Manual techniques;Passive range of motion   PT Next Visit Plan Continue per lumbar fusion protocol with modalites and gait training as needed per MPT POC.   Consulted and Agree with Plan of Care Patient      Patient will benefit from skilled therapeutic intervention in order to improve the following deficits and impairments:  Pain, Decreased activity tolerance, Decreased mobility, Decreased range of motion, Decreased strength  Visit Diagnosis: Bilateral low back pain without sciatica  Muscle weakness (generalized)     Problem List Patient Active Problem List   Diagnosis Date Noted  . Lumbosacral spondylosis with radiculopathy 07/02/2015    RAMSEUR,CHRIS, PTA 08/07/2015, 6:12 PM  [facility] [place], [place], [address] Phone: [phone]   Fax:  [phone]  Name: Teyah Segalla MRN: [id number] Date of Birth: 12-Jul-1955

## 2015-08-09 ENCOUNTER — Ambulatory Visit: Payer: BLUE CROSS/BLUE SHIELD | Admitting: Physical Therapy

## 2015-08-09 DIAGNOSIS — M545 Low back pain, unspecified: Secondary | ICD-10-CM

## 2015-08-09 DIAGNOSIS — M6281 Muscle weakness (generalized): Secondary | ICD-10-CM

## 2015-08-09 NOTE — Therapy (Signed)
[facility] [place], [place], [address] Phone: [phone]   Fax:  [phone]  Physical Therapy Treatment  Patient Details  Name: Lisa Stephens MRN: [id number] Date of Birth: 1955-05-25 Referring Provider: Cyndy Freeze MD.  Encounter Date: 08/09/2015      PT End of Session - 08/09/15 1303    Visit Number 6   Number of Visits 16   Date for PT Re-Evaluation 09/17/15   PT Start Time 1301   PT Stop Time 1404   PT Time Calculation (min) 63 min   Activity Tolerance Patient tolerated treatment well   Behavior During Therapy [facility] for tasks assessed/performed      Past Medical History  Diagnosis Date  . Hypertension   . Shortness of breath dyspnea   . Hypothyroidism   . Diabetes mellitus without complication ([place])   . Depression   . Arthritis     Past Surgical History  Procedure Laterality Date  . Abdominal hysterectomy    . Tubal ligation    . Maximum access (mas)posterior lumbar interbody fusion (plif) 2 level N/A 07/02/2015    Procedure: L3-4 L4-5 Maximum access posterior lumbar interbody fusion;  Surgeon: Kevan Ny Ditty, MD;  Location: [facility];  Service: Neurosurgery;  Laterality: N/A;  L3-4 L4-5 Maximum access posterior lumbar interbody fusion    There were no vitals filed for this visit.      Subjective Assessment - 08/09/15 1303    Subjective Patient reports 1/10 pain today. No new complaints.   Limitations Walking;Sitting;Standing   How long can you sit comfortably? no limitations   How long can you stand comfortably? 10 minutes.   How long can you walk comfortably? Short distances.   Currently in Pain? Yes   Pain Score 1    Pain Location Back   Pain Orientation Left;Right;Lower   Pain Descriptors / Indicators Aching   Pain Type Surgical pain                         [facility] Adult PT Treatment/Exercise - 08/09/15 0001    Ambulation/Gait   Ambulation/Gait Yes   Ambulation/Gait Assistance 7: Independent   Ambulation Distance (Feet) 80 Feet   Assistive device None   Ambulation Surface Level   Gait Comments patient amb with R hip externally rotated.   Lumbar Exercises: Stretches   Single Knee to Chest Stretch 2 reps;30 seconds   Piriformis Stretch 2 reps;30 seconds  SK toward opp side    Lumbar Exercises: Aerobic   Stationary Bike Nustep L5 x15  min   Lumbar Exercises: Standing   Other Standing Lumbar Exercises R hip ABD with hip IR x 30   Lumbar Exercises: Supine   Other Supine Lumbar Exercises AA hip ABD with PT assist x 20 with hip held in IR   Other Supine Lumbar Exercises Hip IR in long sitting x 20   Lumbar Exercises: Sidelying   Clam 20 reps   Modalities   Modalities Electrical Stimulation   Moist Heat Therapy   Number Minutes Moist Heat 15 Minutes   Moist Heat Location Lumbar Spine   Electrical Stimulation   Electrical Stimulation Location B lumbar paraspinals,  Premod x 15 mins 80-150hz    Electrical Stimulation Goals Pain                PT Education - 08/09/15 1557    Education provided Yes   Education Details HEP   Person(s) Educated Patient  Methods Explanation;Demonstration;Handout   Comprehension Verbalized understanding;Returned demonstration             PT Long Term Goals - 08/02/15 1312    PT LONG TERM GOAL #1   Title Ind with a HEP.   Time 8   Period Weeks   Status Achieved   PT LONG TERM GOAL #2   Title Sit 30 minutes with pain not > 3/10.   Time 8   Period Weeks   Status Achieved   PT LONG TERM GOAL #3   Title Stand 30 minutes with pain not > 3/10.   Time 8   Period Weeks   Status On-going   PT LONG TERM GOAL #4   Title Sleep 6 hours undisturbed.   Time 8   Period Weeks   Status On-going   PT LONG TERM GOAL #5   Title Walk a community distance without assistive device.   Time 8   Period Weeks   Status On-going   PT LONG TERM GOAL #6   Title Increase right ankle dorsiflexion  strength to 4/5 to increase toe clearance while walking.   Time 8   Period Weeks   Status On-going               Plan - 08/09/15 1558    Clinical Impression Statement Patient did well with therapy today. She amb with R hip ER and has tightness in B hip internal rotators. She has marked weakness in R glut med and required AA with supine strengthening. She required VCs for correct sit to supine multiple times.   PT Next Visit Plan Continue per lumbar fusion protocol with modalites and gait training. Continue to monitor supine to sit for correct form.    PT Home Exercise Plan standing hip ABD      Patient will benefit from skilled therapeutic intervention in order to improve the following deficits and impairments:  Pain, Decreased activity tolerance, Decreased mobility, Decreased range of motion, Decreased strength  Visit Diagnosis: Bilateral low back pain without sciatica  Muscle weakness (generalized)     Problem List Patient Active Problem List   Diagnosis Date Noted  . Lumbosacral spondylosis with radiculopathy 07/02/2015   Madelyn Flavors PT  08/09/2015, 4:02 PM  [facility] [place], [place], [address] Phone: [phone]   Fax:  [phone]  Name: Lisa Stephens MRN: [id number] Date of Birth: 1956/03/01

## 2015-08-09 NOTE — Patient Instructions (Signed)
  ABDUCTION: Standing (Active)   Stand, feet flat with R toes pointed toward the left foot. Step R leg out to side and then return it to start. Repeat 10 times. Do 1-3 sets. 1-2 times per day.  Madelyn Flavors, PT 08/09/2015 1:53 PM [facility] [place], [place], [address] Phone: [phone]   Fax:  [phone]

## 2015-08-14 ENCOUNTER — Encounter: Payer: Self-pay | Admitting: Physical Therapy

## 2015-08-14 ENCOUNTER — Ambulatory Visit: Payer: BLUE CROSS/BLUE SHIELD | Admitting: Physical Therapy

## 2015-08-14 DIAGNOSIS — M545 Low back pain, unspecified: Secondary | ICD-10-CM

## 2015-08-14 DIAGNOSIS — M6281 Muscle weakness (generalized): Secondary | ICD-10-CM

## 2015-08-14 NOTE — Therapy (Addendum)
[facility] [place], [place], [address] Phone: [phone]   Fax:  [phone]  Physical Therapy Treatment  Patient Details  Name: Lisa Stephens MRN: [id number] Date of Birth: [birth year] Referring Provider: Cyndy Freeze MD.  Encounter Date: 08/14/2015      PT End of Session - 08/14/15 1337    Visit Number 7   Number of Visits 16   Date for PT Re-Evaluation 09/17/15   PT Start Time [id number]   PT Stop Time 1434   PT Time Calculation (min) 47 min   Equipment Utilized During Treatment Back brace   Activity Tolerance Patient tolerated treatment well   Behavior During Therapy [facility] for tasks assessed/performed      Past Medical History  Diagnosis Date  . Hypertension   . Shortness of breath dyspnea   . Hypothyroidism   . Diabetes mellitus without complication ([place])   . Depression   . Arthritis     Past Surgical History  Procedure Laterality Date  . Abdominal hysterectomy    . Tubal ligation    . Maximum access (mas)posterior lumbar interbody fusion (plif) 2 level N/A 07/02/2015    Procedure: L3-4 L4-5 Maximum access posterior lumbar interbody fusion;  Surgeon: Kevan Ny Ditty, MD;  Location: [place];  Service: Neurosurgery;  Laterality: N/A;  L3-4 L4-5 Maximum access posterior lumbar interbody fusion    There were no vitals filed for this visit.      Subjective Assessment - 08/14/15 1337    Subjective Reports that her R foot and ankle is hurting today and feels as if R ankle has been sprained and has a burning in toes. Reports that without an Aleve 6/10 pain experienced with R ankle and 2-3/10 pain with Aleve. Reports that she has been able to sleep the last few nights with no pain. Reports that she washes dishes which she states is more than an thirty minutes with no pain.   Limitations Walking;Sitting;Standing   How long can you sit comfortably? no limitations   How long can you stand comfortably? 10 minutes.    How long can you walk comfortably? Short distances.   Currently in Pain? Yes   Pain Score 2    Pain Location Ankle   Pain Orientation Right   Pain Descriptors / Indicators Burning;Sore   Pain Type Surgical pain            [facility] PT Assessment - 08/14/15 0001    Assessment   Medical Diagnosis Spondylolisthesis, lumbosacrall region.   Onset Date/Surgical Date 07/02/15   Next MD Visit 08/16/2015   Restrictions   Weight Bearing Restrictions No                     [facility] Adult PT Treatment/Exercise - 08/14/15 0001    Lumbar Exercises: Aerobic   Stationary Bike Nustep L5 x15  min   Lumbar Exercises: Standing   Other Standing Lumbar Exercises B forward step up 6" x20 reps each, wall pushups x15 reps   Other Standing Lumbar Exercises B seated row red theraband x20 reps, Seated R hip IR red theraband x20 reps   Lumbar Exercises: Sidelying   Clam 20 reps;Other (comment)  BLE   Modalities   Modalities Electrical Stimulation;Moist Heat   Moist Heat Therapy   Number Minutes Moist Heat 15 Minutes   Moist Heat Location Lumbar Spine   Electrical Stimulation   Electrical Stimulation Location B lumbar paraspinals   Electrical Stimulation Action Pre-Mod  Electrical Stimulation Parameters 80-150 Hz x15 min   Electrical Stimulation Goals Pain   Ankle Exercises: Standing   Heel Raises 20 reps   Toe Raise 20 reps                     PT Long Term Goals - 08/14/15 1445    PT LONG TERM GOAL #1   Title Ind with a HEP.   Time 8   Period Weeks   Status Achieved   PT LONG TERM GOAL #2   Title Sit 30 minutes with pain not > 3/10.   Time 8   Period Weeks   Status Achieved   PT LONG TERM GOAL #3   Title Stand 30 minutes with pain not > 3/10.   Time 8   Period Weeks   Status Achieved   PT LONG TERM GOAL #4   Title Sleep 6 hours undisturbed.   Time 8   Period Weeks   Status On-going   PT LONG TERM GOAL #5   Title Walk a community distance without assistive  device.   Time 8   Period Weeks   Status On-going   PT LONG TERM GOAL #6   Title Increase right ankle dorsiflexion strength to 4/5 to increase toe clearance while walking.   Time 8   Period Weeks   Status On-going               Plan - 08/14/15 1422    Clinical Impression Statement Patient tolerated today's treatment well with no reports of increased LBP or ankle pain. Completed all exercises as directed with minimal to moderate multimodal cueing for proper exercise techique and any corrections required. Patient required VCs for erect stance with standing heel and toe raises. Patient demonstrated deficit with R ankle strength as R ankle unable to dorsiflex to same extent as L ankle visually. Patient continues to be very inconsistent in regards to log rolling for transiitional movements from sit to supine. Patient does not utilize an AD for ambulation at this time but continues to ambulate with antalgic gait. Normal modalities response noted following removal of the modalities. Patient experienced low back discomfort as the bolster was removed from under LEs during modalities although upon standing she denied pain.   Rehab Potential Good   PT Frequency 2x / week   PT Duration 8 weeks   PT Treatment/Interventions ADLs/Self Care Home Management;Electrical Stimulation;Moist Heat;Therapeutic exercise;Therapeutic activities;Gait training;Neuromuscular re-education;Patient/family education;Manual techniques;Passive range of motion   PT Next Visit Plan Continue per lumbar fusion protocol with modalites and gait training. Continue to monitor supine to sit for correct form.    PT Home Exercise Plan standing hip ABD   Consulted and Agree with Plan of Care Patient      Patient will benefit from skilled therapeutic intervention in order to improve the following deficits and impairments:  Pain, Decreased activity tolerance, Decreased mobility, Decreased range of motion, Decreased strength  Visit  Diagnosis: Bilateral low back pain without sciatica  Muscle weakness (generalized)     Problem List Patient Active Problem List   Diagnosis Date Noted  . Lumbosacral spondylosis with radiculopathy 07/02/2015    APPLEGATE, [place], PTA 08/14/2015, 5:28 PM Mali Applegate MPT [facility] [place], [place], [address] Phone: [phone]   Fax:  [phone]  Name: Jermiah Basich MRN: [id number] Date of Birth: 11-28-1955

## 2015-08-16 ENCOUNTER — Ambulatory Visit: Payer: BLUE CROSS/BLUE SHIELD | Admitting: Physical Therapy

## 2015-08-16 ENCOUNTER — Encounter: Payer: Self-pay | Admitting: Physical Therapy

## 2015-08-16 DIAGNOSIS — M545 Low back pain, unspecified: Secondary | ICD-10-CM

## 2015-08-16 DIAGNOSIS — M6281 Muscle weakness (generalized): Secondary | ICD-10-CM

## 2015-08-16 NOTE — Therapy (Signed)
[facility] [place], [place], [address] Phone: [phone]   Fax:  [phone]  Physical Therapy Treatment  Patient Details  Name: Lisa Stephens MRN: [id number] Date of Birth: 18-Aug-1955 Referring Provider: Cyndy Freeze MD.  Encounter Date: 08/16/2015      PT End of Session - 08/16/15 1425    Visit Number 8   Number of Visits 16   Date for PT Re-Evaluation 09/17/15   PT Start Time [id number]   PT Stop Time 1518   PT Time Calculation (min) 45 min   Equipment Utilized During Treatment Back brace   Activity Tolerance Patient tolerated treatment well   Behavior During Therapy [facility] for tasks assessed/performed      Past Medical History  Diagnosis Date  . Hypertension   . Shortness of breath dyspnea   . Hypothyroidism   . Diabetes mellitus without complication ([place])   . Depression   . Arthritis     Past Surgical History  Procedure Laterality Date  . Abdominal hysterectomy    . Tubal ligation    . Maximum access (mas)posterior lumbar interbody fusion (plif) 2 level N/A 07/02/2015    Procedure: L3-4 L4-5 Maximum access posterior lumbar interbody fusion;  Surgeon: Kevan Ny Ditty, MD;  Location: [place] [facility];  Service: Neurosurgery;  Laterality: N/A;  L3-4 L4-5 Maximum access posterior lumbar interbody fusion    There were no vitals filed for this visit.      Subjective Assessment - 08/16/15 1424    Subjective Reports that MD gave her a medication for nerve irritation as he states is causing the burning across toes and her limp. Reports that she is to stay in her brace for another month. Continues to report burning across R toes and states that it feels as if skin is rubbed off it.   Limitations Walking;Sitting;Standing   How long can you sit comfortably? no limitations   How long can you stand comfortably? 10 minutes.   How long can you walk comfortably? Short distances.   Currently in Pain? Other (Comment)  Gave no  numerical pain rating for pain/discomfort in R foot.            [facility] PT Assessment - 08/16/15 0001    Assessment   Medical Diagnosis Spondylolisthesis, lumbosacrall region.   Onset Date/Surgical Date 07/02/15   Next MD Visit 09/18/2015   Restrictions   Weight Bearing Restrictions No                     [facility] Adult PT Treatment/Exercise - 08/16/15 0001    Lumbar Exercises: Aerobic   Stationary Bike Nustep L5 x13 min   Lumbar Exercises: Standing   Other Standing Lumbar Exercises B forward step up 6" x20 reps each, wall pushups x20 reps   Other Standing Lumbar Exercises B seated row red theraband x30 reps, Seated R hip IR red theraband x30 reps   Lumbar Exercises: Seated   Long Arc Quad on Chair Strengthening;Both;3 sets;10 reps;Weights   LAQ on Chair Weights (lbs) 4   Lumbar Exercises: Supine   Bent Knee Raise 20 reps   Lumbar Exercises: Sidelying   Clam 20 reps;Other (comment)  BLE   Modalities   Modalities Electrical Stimulation;Moist Heat   Moist Heat Therapy   Number Minutes Moist Heat 15 Minutes   Moist Heat Location Lumbar Spine   Electrical Stimulation   Electrical Stimulation Location B lumbar paraspinals   Electrical Stimulation Action Pre-Mod   Electrical Stimulation  Parameters 80-150 Hz x15 min   Electrical Stimulation Goals Pain                     PT Long Term Goals - 08/14/15 1445    PT LONG TERM GOAL #1   Title Ind with a HEP.   Time 8   Period Weeks   Status Achieved   PT LONG TERM GOAL #2   Title Sit 30 minutes with pain not > 3/10.   Time 8   Period Weeks   Status Achieved   PT LONG TERM GOAL #3   Title Stand 30 minutes with pain not > 3/10.   Time 8   Period Weeks   Status Achieved   PT LONG TERM GOAL #4   Title Sleep 6 hours undisturbed.   Time 8   Period Weeks   Status On-going   PT LONG TERM GOAL #5   Title Walk a community distance without assistive device.   Time 8   Period Weeks   Status On-going    PT LONG TERM GOAL #6   Title Increase right ankle dorsiflexion strength to 4/5 to increase toe clearance while walking.   Time 8   Period Weeks   Status On-going               Plan - 08/16/15 1505    Clinical Impression Statement Patient tolerated today's treatment well with no reports of increased LBP or ankle discomfort. Patient continues to tolerate exercises well with minimal multimodal cueing for proper exercise technique. Patient reported that she had forgotten to ask MD if walking on her treadmill at home was okay at this point. MPT educated patient to wait until 8 weeks per protocol and treadmill will be attempted in clinic prior to trying at home. Patient continues to demonstrate dificulty regarding transitional movements from supine to sit or sidelying to sidelying. Electrical stimulation and moist heat were conducted in sitting positiion today as patient reports that she has discomfort in low back as bolster is removed from under her legs as she is in supine. Patient tolerated modalities much better in sitting per patient report.   Rehab Potential Good   PT Frequency 2x / week   PT Duration 8 weeks   PT Treatment/Interventions ADLs/Self Care Home Management;Electrical Stimulation;Moist Heat;Therapeutic exercise;Therapeutic activities;Gait training;Neuromuscular re-education;Patient/family education;Manual techniques;Passive range of motion   PT Next Visit Plan Continue per lumbar fusion protocol with modalites and gait training. Continue to monitor supine to sit for correct form. Continue modalities in sitting per patient comfort.   PT Home Exercise Plan standing hip ABD   Consulted and Agree with Plan of Care Patient      Patient will benefit from skilled therapeutic intervention in order to improve the following deficits and impairments:  Pain, Decreased activity tolerance, Decreased mobility, Decreased range of motion, Decreased strength  Visit Diagnosis: Bilateral low  back pain without sciatica  Muscle weakness (generalized)     Problem List Patient Active Problem List   Diagnosis Date Noted  . Lumbosacral spondylosis with radiculopathy 07/02/2015    [name], PTA 08/16/2015, 3:21 PM  [facility] [place], [place], [address] Phone: [phone]   Fax:  [phone]  Name: Mckynzi Lionetti MRN: [id number] Date of Birth: January 24, 1956

## 2015-08-21 ENCOUNTER — Ambulatory Visit: Payer: BLUE CROSS/BLUE SHIELD | Admitting: *Deleted

## 2015-08-21 DIAGNOSIS — M6281 Muscle weakness (generalized): Secondary | ICD-10-CM

## 2015-08-21 DIAGNOSIS — M545 Low back pain, unspecified: Secondary | ICD-10-CM

## 2015-08-21 NOTE — Therapy (Signed)
[facility] [place], [place], [address] Phone: [phone]   Fax:  [phone]  Physical Therapy Treatment  Patient Details  Name: Lisa Stephens MRN: [id number] Date of Birth: November 26, 1955 Referring Provider: Cyndy Freeze MD.  Encounter Date: 08/21/2015      PT End of Session - 08/21/15 1259    Visit Number 9   Number of Visits 16   Date for PT Re-Evaluation 09/17/15   PT Start Time 1300   PT Stop Time [id number]   PT Time Calculation (min) 59 min      Past Medical History  Diagnosis Date  . Hypertension   . Shortness of breath dyspnea   . Hypothyroidism   . Diabetes mellitus without complication ([place])   . Depression   . Arthritis     Past Surgical History  Procedure Laterality Date  . Abdominal hysterectomy    . Tubal ligation    . Maximum access (mas)posterior lumbar interbody fusion (plif) 2 level N/A 07/02/2015    Procedure: L3-4 L4-5 Maximum access posterior lumbar interbody fusion;  Surgeon: Kevan Ny Ditty, MD;  Location: [place] [facility];  Service: Neurosurgery;  Laterality: N/A;  L3-4 L4-5 Maximum access posterior lumbar interbody fusion    There were no vitals filed for this visit.      Subjective Assessment - 08/21/15 1257    Subjective Reports that MD gave her a medication for nerve irritation as he states is causing the burning across toes and her limp. Reports that she is to stay in her brace for another month. Continues to report burning across R toes and states that it feels as if skin is rubbed off it.   Limitations Walking;Sitting;Standing   How long can you sit comfortably? no limitations   How long can you stand comfortably? 10 minutes.   How long can you walk comfortably? Short distances.   Currently in Pain? Yes   Pain Score 3    Pain Location Ankle   Pain Orientation Right   Pain Descriptors / Indicators Burning;Sore   Pain Type Acute pain                         [facility]  Adult PT Treatment/Exercise - 08/21/15 0001    Exercises   Exercises Lumbar;Ankle   Lumbar Exercises: Aerobic   Stationary Bike Nustep L5 x15 min   Lumbar Exercises: Standing   Other Standing Lumbar Exercises B forward step up 6" x30 reps each, wall pushups x30 reps   Other Standing Lumbar Exercises B seated row red theraband x30 reps, Seated R hip IR red theraband x30 reps   Lumbar Exercises: Seated   Long Arc Quad on Chair Strengthening;Both;3 sets;10 reps;Weights   LAQ on Chair Weights (lbs) 4   Lumbar Exercises: Supine   Bent Knee Raise --   Lumbar Exercises: Sidelying   Clam 20 reps;Other (comment);10 reps  seated red band   Modalities   Modalities Electrical Stimulation;Moist Heat   Moist Heat Therapy   Number Minutes Moist Heat 15 Minutes   Moist Heat Location Lumbar Spine  seated   Electrical Stimulation   Electrical Stimulation Location B lumbar paraspinals,  Premod x 15 mins 80-150hz    seated   Electrical Stimulation Goals Pain   Ankle Exercises: Standing   Rocker Board 5 minutes  DF/PF and calf stretching, balance   Ankle Exercises: Seated   Other Seated Ankle Exercises Dyna disc RT foot all motions 3x10  PT Long Term Goals - 08/14/15 1445    PT LONG TERM GOAL #1   Title Ind with a HEP.   Time 8   Period Weeks   Status Achieved   PT LONG TERM GOAL #2   Title Sit 30 minutes with pain not > 3/10.   Time 8   Period Weeks   Status Achieved   PT LONG TERM GOAL #3   Title Stand 30 minutes with pain not > 3/10.   Time 8   Period Weeks   Status Achieved   PT LONG TERM GOAL #4   Title Sleep 6 hours undisturbed.   Time 8   Period Weeks   Status On-going   PT LONG TERM GOAL #5   Title Walk a community distance without assistive device.   Time 8   Period Weeks   Status On-going   PT LONG TERM GOAL #6   Title Increase right ankle dorsiflexion strength to 4/5 to increase toe clearance while walking.   Time 8   Period Weeks    Status On-going               Plan - 08/21/15 1314    Clinical Impression Statement Pt did fairly well with Rx today and was able to complete all the exs and act.'s for core and LEs with no increase in pain. She feels that her foot is doing better with the new meds with less burning now. She still limps when she is walking, but less. MH and estim were performed  in the sitting position again for  comfort. Goals are on-going   Rehab Potential Good   Clinical Impairments Affecting Rehab Potential Fusion surgery 07-02-15   PT Frequency 2x / week   PT Duration 8 weeks   PT Next Visit Plan Continue per lumbar fusion protocol with modalites and gait training. Continue to monitor supine to sit for correct form. Continue modalities in sitting per patient comfort.   PT Home Exercise Plan standing hip ABD   Consulted and Agree with Plan of Care Patient      Patient will benefit from skilled therapeutic intervention in order to improve the following deficits and impairments:  Pain, Decreased activity tolerance, Decreased mobility, Decreased range of motion, Decreased strength  Visit Diagnosis: Bilateral low back pain without sciatica  Muscle weakness (generalized)     Problem List Patient Active Problem List   Diagnosis Date Noted  . Lumbosacral spondylosis with radiculopathy 07/02/2015    Vincent Ehrler,CHRIS, PTA 08/21/2015, 2:09 PM  [facility] [address] [place], [place], [address] Phone: [phone]   Fax:  [phone]  Name: Torren Durnan MRN: [id number] Date of Birth: 31-Dec-1955

## 2015-08-23 ENCOUNTER — Ambulatory Visit: Payer: BLUE CROSS/BLUE SHIELD | Admitting: *Deleted

## 2015-08-23 DIAGNOSIS — M6281 Muscle weakness (generalized): Secondary | ICD-10-CM

## 2015-08-23 DIAGNOSIS — M545 Low back pain, unspecified: Secondary | ICD-10-CM

## 2015-08-23 DIAGNOSIS — H9190 Unspecified hearing loss, unspecified ear: Secondary | ICD-10-CM | POA: Insufficient documentation

## 2015-08-23 DIAGNOSIS — N393 Stress incontinence (female) (male): Secondary | ICD-10-CM | POA: Insufficient documentation

## 2015-08-23 NOTE — Therapy (Signed)
[facility] [place], [place], [address] Phone: [phone]   Fax:  [phone]  Physical Therapy Treatment  Patient Details  Name: Lisa Stephens MRN: [id number] Date of Birth: [birth year] Referring Provider: Cyndy Freeze MD.  Encounter Date: 08/23/2015      PT End of Session - 08/23/15 1408    Visit Number 10   Number of Visits 16   Date for PT Re-Evaluation 09/17/15   PT Start Time 1300   PT Stop Time 1351   PT Time Calculation (min) 51 min      Past Medical History  Diagnosis Date  . Hypertension   . Shortness of breath dyspnea   . Hypothyroidism   . Diabetes mellitus without complication ([place])   . Depression   . Arthritis     Past Surgical History  Procedure Laterality Date  . Abdominal hysterectomy    . Tubal ligation    . Maximum access (mas)posterior lumbar interbody fusion (plif) 2 level N/A 07/02/2015    Procedure: L3-4 L4-5 Maximum access posterior lumbar interbody fusion;  Surgeon: Kevan Ny Ditty, MD;  Location: [place] [facility];  Service: Neurosurgery;  Laterality: N/A;  L3-4 L4-5 Maximum access posterior lumbar interbody fusion    There were no vitals filed for this visit.      Subjective Assessment - 08/23/15 1259    Subjective Reports that MD gave her a medication for nerve irritation as he states is causing the burning across toes and her limp. Reports that she is to stay in her brace for another month. Continues to report burning across R toes and states that it feels as if skin is rubbed off it.   Limitations Walking;Sitting;Standing   How long can you sit comfortably? no limitations   How long can you stand comfortably? 10 minutes.   How long can you walk comfortably? Short distances.   Currently in Pain? Yes   Pain Score 3    Pain Location Ankle  nerve pain   Pain Orientation Right   Pain Descriptors / Indicators Burning   Pain Type Acute pain;Surgical pain   Pain Frequency Constant                          [facility] Adult PT Treatment/Exercise - 08/23/15 0001    Exercises   Exercises Lumbar;Ankle;Knee/Hip   Lumbar Exercises: Aerobic   Stationary Bike Nustep L5 x 20 mins with UE and LE activity with drawin and focus on posture   Lumbar Exercises: Standing   Other Standing Lumbar Exercises B forward step up 6" x30 reps each,and 3x10 side step ups,  wall pushups x30 reps   Other Standing Lumbar Exercises B seated row red theraband x30 reps, Seated R hip IR red theraband x30 reps   Knee/Hip Exercises: Standing   Hip Abduction Both;2 sets;10 reps   Modalities   Modalities Electrical Stimulation;Moist Heat   Moist Heat Therapy   Number Minutes Moist Heat 15 Minutes   Moist Heat Location Lumbar Spine  seated   Electrical Stimulation   Electrical Stimulation Location B lumbar paraspinals,  Premod x 15 mins 80-150hz    seated   Electrical Stimulation Goals Pain   Ankle Exercises: Standing   Rocker Board 5 minutes  DF/PF and calf stretching, balance                     PT Long Term Goals - 08/14/15 1445    PT LONG  TERM GOAL #1   Title Ind with a HEP.   Time 8   Period Weeks   Status Achieved   PT LONG TERM GOAL #2   Title Sit 30 minutes with pain not > 3/10.   Time 8   Period Weeks   Status Achieved   PT LONG TERM GOAL #3   Title Stand 30 minutes with pain not > 3/10.   Time 8   Period Weeks   Status Achieved   PT LONG TERM GOAL #4   Title Sleep 6 hours undisturbed.   Time 8   Period Weeks   Status On-going   PT LONG TERM GOAL #5   Title Walk a community distance without assistive device.   Time 8   Period Weeks   Status On-going   PT LONG TERM GOAL #6   Title Increase right ankle dorsiflexion strength to 4/5 to increase toe clearance while walking.   Time 8   Period Weeks   Status On-going               Plan - 08/23/15 1410    Clinical Impression Statement Pt did fairly well again today and was able to perform  current exs and new ones with minimal pain. Her RT Great toe is her CC due to burning sensation, but meds are helping.She is still unable to meet strength goal in RT anklefor DF due to weakness (4-/5) and other goals due to sleep disturbance and fatigue. Goals are ongoing   Rehab Potential Good   Clinical Impairments Affecting Rehab Potential Fusion surgery 07-02-15   PT Frequency 2x / week   PT Duration 8 weeks   PT Treatment/Interventions ADLs/Self Care Home Management;Electrical Stimulation;Moist Heat;Therapeutic exercise;Therapeutic activities;Gait training;Neuromuscular re-education;Patient/family education;Manual techniques;Passive range of motion   PT Next Visit Plan Continue per lumbar fusion protocol with modalites and gait training. Continue to monitor supine to sit for correct form. Continue modalities in sitting per patient comfort.   PT Home Exercise Plan standing hip ABD   Consulted and Agree with Plan of Care Patient      Patient will benefit from skilled therapeutic intervention in order to improve the following deficits and impairments:  Pain, Decreased activity tolerance, Decreased mobility, Decreased range of motion, Decreased strength  Visit Diagnosis: Bilateral low back pain without sciatica  Muscle weakness (generalized)     Problem List Patient Active Problem List   Diagnosis Date Noted  . Lumbosacral spondylosis with radiculopathy 07/02/2015    RAMSEUR,CHRIS, PTA 08/23/2015, 2:22 PM  [facility] [place], [place], [address] Phone: [phone]   Fax:  [phone]  Name: Lisa Stephens MRN: [id number] Date of Birth: 1955-06-19

## 2015-08-27 DIAGNOSIS — E669 Obesity, unspecified: Secondary | ICD-10-CM | POA: Diagnosis not present

## 2015-08-27 DIAGNOSIS — E119 Type 2 diabetes mellitus without complications: Secondary | ICD-10-CM | POA: Diagnosis not present

## 2015-08-27 DIAGNOSIS — E039 Hypothyroidism, unspecified: Secondary | ICD-10-CM | POA: Diagnosis not present

## 2015-08-27 DIAGNOSIS — Z6833 Body mass index (BMI) 33.0-33.9, adult: Secondary | ICD-10-CM | POA: Diagnosis not present

## 2015-08-28 ENCOUNTER — Ambulatory Visit: Payer: BLUE CROSS/BLUE SHIELD | Admitting: Physical Therapy

## 2015-08-28 DIAGNOSIS — M545 Low back pain, unspecified: Secondary | ICD-10-CM

## 2015-08-28 DIAGNOSIS — M6281 Muscle weakness (generalized): Secondary | ICD-10-CM | POA: Diagnosis not present

## 2015-08-28 NOTE — Therapy (Signed)
[facility] [place], [place], [address] Phone: [phone]   Fax:  [phone]  Physical Therapy Treatment  Patient Details  Name: Lisa Stephens MRN: [id number] Date of Birth: 05/10/1955 Referring Provider: Cyndy Freeze MD.  Encounter Date: 08/28/2015    Past Medical History  Diagnosis Date  . Hypertension   . Shortness of breath dyspnea   . Hypothyroidism   . Diabetes mellitus without complication ([place])   . Depression   . Arthritis     Past Surgical History  Procedure Laterality Date  . Abdominal hysterectomy    . Tubal ligation    . Maximum access (mas)posterior lumbar interbody fusion (plif) 2 level N/A 07/02/2015    Procedure: L3-4 L4-5 Maximum access posterior lumbar interbody fusion;  Surgeon: Kevan Ny Ditty, MD;  Location: [place] [facility];  Service: Neurosurgery;  Laterality: N/A;  L3-4 L4-5 Maximum access posterior lumbar interbody fusion    There were no vitals filed for this visit.      Subjective Assessment - 08/28/15 1329    Subjective i'm having a good day.  Pain is low today.   Limitations Walking;Sitting;Standing   How long can you sit comfortably? no limitations   How long can you stand comfortably? 10 minutes.   How long can you walk comfortably? Short distances.   Pain Score 2    Pain Location Back   Pain Orientation Right;Left;Lower;Medial   Pain Descriptors / Indicators Aching;Burning   Pain Type Acute pain;Surgical pain                         [facility] Adult PT Treatment/Exercise - 08/28/15 0001    Exercises   Exercises Lumbar   Lumbar Exercises: Aerobic   Stationary Bike Nustep level 4 x 23 minutes.   Lumbar Exercises: Standing   Other Standing Lumbar Exercises Green XTS scap retraction; bil shld extension and forward punches x 4 minutes.   Other Standing Lumbar Exercises Rockerboard for core stabilization x 6 minutes.   Lumbar Exercises: Supine   Bridge Limitations  Slow reps with 3 second holds 2 x 10 reps.   Other Supine Lumbar Exercises Reverse crunches slow reps 2 x 10 reps.   Modalities   Modalities Electrical Stimulation   Moist Heat Therapy   Number Minutes Moist Heat 15 Minutes   Moist Heat Location Lumbar Spine   Electrical Stimulation   Electrical Stimulation Location Bil low back.   Electrical Stimulation Action Pre-mod   Electrical Stimulation Parameters 80-150 Hz.   Electrical Stimulation Goals Pain                     PT Long Term Goals - 08/14/15 1445    PT LONG TERM GOAL #1   Title Ind with a HEP.   Time 8   Period Weeks   Status Achieved   PT LONG TERM GOAL #2   Title Sit 30 minutes with pain not > 3/10.   Time 8   Period Weeks   Status Achieved   PT LONG TERM GOAL #3   Title Stand 30 minutes with pain not > 3/10.   Time 8   Period Weeks   Status Achieved   PT LONG TERM GOAL #4   Title Sleep 6 hours undisturbed.   Time 8   Period Weeks   Status On-going   PT LONG TERM GOAL #5   Title Walk a community distance without assistive device.  Time 8   Period Weeks   Status On-going   PT LONG TERM GOAL #6   Title Increase right ankle dorsiflexion strength to 4/5 to increase toe clearance while walking.   Time 8   Period Weeks   Status On-going             Patient will benefit from skilled therapeutic intervention in order to improve the following deficits and impairments:     Visit Diagnosis: Bilateral low back pain without sciatica  Muscle weakness (generalized)     Problem List Patient Active Problem List   Diagnosis Date Noted  . Lumbosacral spondylosis with radiculopathy 07/02/2015    Anola Mcgough, [place] MPT 08/28/2015, 5:02 PM  [facility] [address] [place], [place], [address] Phone: [phone]   Fax:  [phone]  Name: Lisa Stephens MRN: [id number] Date of Birth: 1955-04-14

## 2015-08-30 ENCOUNTER — Encounter: Payer: Self-pay | Admitting: Physical Therapy

## 2015-08-30 ENCOUNTER — Ambulatory Visit: Payer: BLUE CROSS/BLUE SHIELD | Admitting: Physical Therapy

## 2015-08-30 DIAGNOSIS — M6281 Muscle weakness (generalized): Secondary | ICD-10-CM | POA: Diagnosis not present

## 2015-08-30 DIAGNOSIS — M545 Low back pain, unspecified: Secondary | ICD-10-CM

## 2015-08-30 NOTE — Therapy (Signed)
[facility] [place], [place], [address] Phone: [phone]   Fax:  [phone]  Physical Therapy Treatment  Patient Details  Name: Lisa Stephens MRN: [id number] Date of Birth: [birth year] Referring Provider: Cyndy Freeze MD.  Encounter Date: 08/30/2015      PT End of Session - 08/30/15 1121    Visit Number 12   Number of Visits 16   Date for PT Re-Evaluation 09/17/15   PT Start Time 1115   PT Stop Time 1202   PT Time Calculation (min) 47 min   Equipment Utilized During Treatment Back brace   Activity Tolerance Patient tolerated treatment well   Behavior During Therapy [facility] for tasks assessed/performed      Past Medical History  Diagnosis Date  . Hypertension   . Shortness of breath dyspnea   . Hypothyroidism   . Diabetes mellitus without complication ([place])   . Depression   . Arthritis     Past Surgical History  Procedure Laterality Date  . Abdominal hysterectomy    . Tubal ligation    . Maximum access (mas)posterior lumbar interbody fusion (plif) 2 level N/A 07/02/2015    Procedure: L3-4 L4-5 Maximum access posterior lumbar interbody fusion;  Surgeon: Kevan Ny Ditty, MD;  Location: [place] [facility];  Service: Neurosurgery;  Laterality: N/A;  L3-4 L4-5 Maximum access posterior lumbar interbody fusion    There were no vitals filed for this visit.      Subjective Assessment - 08/30/15 1115    Subjective Denies pain only soreness from previous treatment workout.   Limitations Walking;Sitting;Standing   How long can you sit comfortably? no limitations   How long can you stand comfortably? 10 minutes.   How long can you walk comfortably? Short distances.   Currently in Pain? No/denies            [facility] PT Assessment - 08/30/15 0001    Assessment   Medical Diagnosis Spondylolisthesis, lumbosacrall region.   Onset Date/Surgical Date 07/02/15   Next MD Visit 09/18/2015   Restrictions   Weight Bearing  Restrictions No                     [facility] Adult PT Treatment/Exercise - 08/30/15 0001    Lumbar Exercises: Aerobic   Stationary Bike Nustep L5 x 15 minutes.   Tread Mill 1.0-1.3 mph x10 min with HH assist   Lumbar Exercises: Standing   Wall Slides 10 reps   Row Strengthening;Both;20 reps  Pink XTS   Shoulder Extension Strengthening;Both;20 reps  Pink XTS   Modalities   Modalities Electrical Stimulation;Moist Heat   Moist Heat Therapy   Number Minutes Moist Heat 15 Minutes   Moist Heat Location Lumbar Spine   Electrical Stimulation   Electrical Stimulation Location Bil low back.   Electrical Stimulation Action Pre-Mod   Electrical Stimulation Parameters 80-150 Hz x15 min   Electrical Stimulation Goals Pain                     PT Long Term Goals - 08/14/15 1445    PT LONG TERM GOAL #1   Title Ind with a HEP.   Time 8   Period Weeks   Status Achieved   PT LONG TERM GOAL #2   Title Sit 30 minutes with pain not > 3/10.   Time 8   Period Weeks   Status Achieved   PT LONG TERM GOAL #3   Title Stand 30 minutes with  pain not > 3/10.   Time 8   Period Weeks   Status Achieved   PT LONG TERM GOAL #4   Title Sleep 6 hours undisturbed.   Time 8   Period Weeks   Status On-going   PT LONG TERM GOAL #5   Title Walk a community distance without assistive device.   Time 8   Period Weeks   Status On-going   PT LONG TERM GOAL #6   Title Increase right ankle dorsiflexion strength to 4/5 to increase toe clearance while walking.   Time 8   Period Weeks   Status On-going               Plan - 08/30/15 1151    Clinical Impression Statement Patient continues to progress with therapeutic exercises well within protocol. Patient was very excited to be able to begin treadmill at a low speed. Patient denies as much tingling in R foot and states that she doesn't limp to the extent she did that is worse after prolonged sitting. Patient completed all  exercises well with no report of pain or discomfort. Patient was educated that if she was to begin treadmill at home to start at low speed and to start low time wise and increase. Patient educated to also stop if any pain is experienced. Normal modalities response noted following removal of the modalities. Patient denied pain upon end of treatment.   Rehab Potential Good   Clinical Impairments Affecting Rehab Potential Fusion surgery 07-02-15   PT Frequency 2x / week   PT Duration 8 weeks   PT Treatment/Interventions ADLs/Self Care Home Management;Electrical Stimulation;Moist Heat;Therapeutic exercise;Therapeutic activities;Gait training;Neuromuscular re-education;Patient/family education;Manual techniques;Passive range of motion   PT Next Visit Plan Continue per lumbar fusion protocol with modalites and gait training. Continue to monitor supine to sit for correct form. Continue modalities in sitting per patient comfort.   PT Home Exercise Plan standing hip ABD   Consulted and Agree with Plan of Care Patient      Patient will benefit from skilled therapeutic intervention in order to improve the following deficits and impairments:  Pain, Decreased activity tolerance, Decreased mobility, Decreased range of motion, Decreased strength  Visit Diagnosis: Bilateral low back pain without sciatica  Muscle weakness (generalized)     Problem List Patient Active Problem List   Diagnosis Date Noted  . Lumbosacral spondylosis with radiculopathy 07/02/2015    [name], PTA 08/30/2015, 12:03 PM  [facility] [place], [place], [address] Phone: [phone]   Fax:  [phone]  Name: Elivia Abrahams MRN: [id number] Date of Birth: 05/29/1955

## 2015-09-06 ENCOUNTER — Encounter: Payer: BLUE CROSS/BLUE SHIELD | Admitting: Physical Therapy

## 2015-09-06 HISTORY — PX: BACK SURGERY: SHX140

## 2015-09-11 ENCOUNTER — Ambulatory Visit: Payer: BLUE CROSS/BLUE SHIELD | Attending: Neurological Surgery | Admitting: Physical Therapy

## 2015-09-11 DIAGNOSIS — M6281 Muscle weakness (generalized): Secondary | ICD-10-CM

## 2015-09-11 DIAGNOSIS — M545 Low back pain, unspecified: Secondary | ICD-10-CM

## 2015-09-11 NOTE — Therapy (Signed)
[facility] [place], [place], [address] Phone: [phone]   Fax:  [phone]  Physical Therapy Treatment  Patient Details  Name: Lisa Stephens MRN: [id number] Date of Birth: 02-Apr-1956 Referring Provider: Cyndy Freeze MD.  Encounter Date: 09/11/2015      PT End of Session - 09/11/15 1350    Visit Number 13   Number of Visits 16   Date for PT Re-Evaluation 09/17/15   PT Start Time 0900      Past Medical History  Diagnosis Date  . Hypertension   . Shortness of breath dyspnea   . Hypothyroidism   . Diabetes mellitus without complication ([place])   . Depression   . Arthritis     Past Surgical History  Procedure Laterality Date  . Abdominal hysterectomy    . Tubal ligation    . Maximum access (mas)posterior lumbar interbody fusion (plif) 2 level N/A 07/02/2015    Procedure: L3-4 L4-5 Maximum access posterior lumbar interbody fusion;  Surgeon: Kevan Ny Ditty, MD;  Location: [place] [facility];  Service: Neurosurgery;  Laterality: N/A;  L3-4 L4-5 Maximum access posterior lumbar interbody fusion    There were no vitals filed for this visit.      Subjective Assessment - 09/11/15 1305    Subjective (p) I'm doing very well.   Limitations (p) Walking;Sitting;Standing   How long can you sit comfortably? (p) no limitations   How long can you stand comfortably? (p) 10 minutes.   How long can you walk comfortably? (p) Short distances.   Pain Score (p) 1    Pain Location (p) Back   Pain Orientation (p) Right;Left;Lower;Medial   Pain Descriptors / Indicators (p) Aching;Burning   Pain Type (p) Acute pain;Surgical pain                                      PT Long Term Goals - 08/14/15 1445    PT LONG TERM GOAL #1   Title Ind with a HEP.   Time 8   Period Weeks   Status Achieved   PT LONG TERM GOAL #2   Title Sit 30 minutes with pain not > 3/10.   Time 8   Period Weeks   Status Achieved    PT LONG TERM GOAL #3   Title Stand 30 minutes with pain not > 3/10.   Time 8   Period Weeks   Status Achieved   PT LONG TERM GOAL #4   Title Sleep 6 hours undisturbed.   Time 8   Period Weeks   Status On-going   PT LONG TERM GOAL #5   Title Walk a community distance without assistive device.   Time 8   Period Weeks   Status On-going   PT LONG TERM GOAL #6   Title Increase right ankle dorsiflexion strength to 4/5 to increase toe clearance while walking.   Time 8   Period Weeks   Status On-going             Patient will benefit from skilled therapeutic intervention in order to improve the following deficits and impairments:     Visit Diagnosis: Bilateral low back pain without sciatica  Muscle weakness (generalized)     Problem List Patient Active Problem List   Diagnosis Date Noted  . Lumbosacral spondylosis with radiculopathy 07/02/2015   Treatment:  Bike level 2 x 15 minutes.  In  parallel bars:  Rockerboard and inverted BOSU ball for core stability and neuro-re-education (10 minutes) f/b forward and lateral step-ups (5 minutes f/b HMP and e'stim x 15 minutes to patient's low back.  Patient did great today. Beatrice Sehgal, [place]  MPT  09/11/2015, 1:51 PM  [facility] [address] [place], [place], [address] Phone: [phone]   Fax:  [phone]  Name: Lisa Stephens MRN: [id number] Date of Birth: 03/10/1956

## 2015-09-13 ENCOUNTER — Ambulatory Visit: Payer: BLUE CROSS/BLUE SHIELD | Admitting: Physical Therapy

## 2015-09-13 ENCOUNTER — Encounter: Payer: Self-pay | Admitting: Physical Therapy

## 2015-09-13 DIAGNOSIS — M6281 Muscle weakness (generalized): Secondary | ICD-10-CM

## 2015-09-13 DIAGNOSIS — M545 Low back pain, unspecified: Secondary | ICD-10-CM

## 2015-09-13 NOTE — Therapy (Signed)
[facility] [place], [place], [address] Phone: [phone]   Fax:  [phone]  Physical Therapy Treatment  Patient Details  Name: Lisa Stephens MRN: [id number] Date of Birth: June 04, 1955 Referring Provider: Cyndy Freeze MD.  Encounter Date: 09/13/2015      PT End of Session - 09/13/15 1114    Visit Number 14   Number of Visits 16   Date for PT Re-Evaluation 09/17/15   PT Start Time 1117   PT Stop Time 1203   PT Time Calculation (min) 46 min   Equipment Utilized During Treatment Back brace   Activity Tolerance Patient tolerated treatment well   Behavior During Therapy [facility] for tasks assessed/performed      Past Medical History  Diagnosis Date  . Hypertension   . Shortness of breath dyspnea   . Hypothyroidism   . Diabetes mellitus without complication ([place])   . Depression   . Arthritis     Past Surgical History  Procedure Laterality Date  . Abdominal hysterectomy    . Tubal ligation    . Maximum access (mas)posterior lumbar interbody fusion (plif) 2 level N/A 07/02/2015    Procedure: L3-4 L4-5 Maximum access posterior lumbar interbody fusion;  Surgeon: Kevan Ny Ditty, MD;  Location: [facility];  Service: Neurosurgery;  Laterality: N/A;  L3-4 L4-5 Maximum access posterior lumbar interbody fusion    There were no vitals filed for this visit.      Subjective Assessment - 09/13/15 1113    Subjective Reports that she only hurts when she gets up from stimulation at PT. Reports that she gets up much better at home. Reports that she is able to sleep better now and has been using   Limitations Walking;Sitting;Standing   How long can you sit comfortably? no limitations   How long can you stand comfortably? 10 minutes.   How long can you walk comfortably? Short distances.   Currently in Pain? No/denies            [facility] PT Assessment - 09/13/15 0001    Assessment   Medical Diagnosis Spondylolisthesis,  lumbosacrall region.   Onset Date/Surgical Date 07/02/15   Next MD Visit 09/18/2015   Restrictions   Weight Bearing Restrictions No   ROM / Strength   AROM / PROM / Strength Strength   Strength   Overall Strength Deficits   Strength Assessment Site Ankle   Right/Left Ankle Right   Right Ankle Dorsiflexion 3-/5                     [facility] Adult PT Treatment/Exercise - 09/13/15 0001    Lumbar Exercises: Aerobic   Stationary Bike L2 x12 min   Lumbar Exercises: Standing   Row Strengthening;Both;Other (comment)  x30 reps with Pink XTS   Shoulder Extension Strengthening;Both  x30 reps with Pink XTS   Knee/Hip Exercises: Standing   Forward Step Up Both;3 sets;10 reps;Hand Hold: 2;Step Height: 6"   Other Standing Knee Exercises DLS on BOSU x3 min with intermittant UE support for core activation with balance   Modalities   Modalities Electrical Stimulation;Moist Heat   Moist Heat Therapy   Number Minutes Moist Heat 15 Minutes   Moist Heat Location Lumbar Spine   Electrical Stimulation   Electrical Stimulation Location Bil low back.   Electrical Stimulation Action Pre-Mod   Electrical Stimulation Parameters 80-150 hz x15 min   Electrical Stimulation Goals Pain   Ankle Exercises: Standing   Heel Raises 20  reps   Toe Raise 20 reps                     PT Long Term Goals - 09/13/15 1128    PT LONG TERM GOAL #1   Title Ind with a HEP.   Time 8   Period Weeks   Status Achieved   PT LONG TERM GOAL #2   Title Sit 30 minutes with pain not > 3/10.   Time 8   Period Weeks   Status Achieved   PT LONG TERM GOAL #3   Title Stand 30 minutes with pain not > 3/10.   Time 8   Period Weeks   Status Achieved   PT LONG TERM GOAL #4   Title Sleep 6 hours undisturbed.   Time 8   Period Weeks   Status Achieved   PT LONG TERM GOAL #5   Title Walk a community distance without assistive device.   Time 8   Period Weeks   Status Achieved   PT LONG TERM GOAL #6    Title Increase right ankle dorsiflexion strength to 4/5 to increase toe clearance while walking.   Time 8   Period Weeks   Status On-going  3-/5 R ankle DF MMT 09/13/2015               Plan - 09/13/15 1150    Clinical Impression Statement Patient has progressed well in PT following lumbar fusion 07/02/2015. Patient has achieved all goals set at time of evaluation except for R ankle DF goal which is still measured as 3-/5 at this time. Patient able to tolerate standing LE and UE exercises without report of pain. Patient's R ankle weakness noted with standing toe raises. Patient is compliant with lumbar brace use and limp has improved since beginning medication given in previous MD appointment. Normal modalities response noted following removal of the modalities in sitting. Patient understanding to avoid any bending and is understanding of squatting to pick up objects now.  Patient denied pain following today's treatment.   Rehab Potential Good   Clinical Impairments Affecting Rehab Potential Fusion surgery 07-02-15   PT Frequency 2x / week   PT Duration 8 weeks   PT Treatment/Interventions ADLs/Self Care Home Management;Electrical Stimulation;Moist Heat;Therapeutic exercise;Therapeutic activities;Gait training;Neuromuscular re-education;Patient/family education;Manual techniques;Passive range of motion   PT Next Visit Plan Continue per MPT POC or MD discretion following MD visit 09/18/2015   PT Home Exercise Plan standing hip ABD   Consulted and Agree with Plan of Care Patient      Patient will benefit from skilled therapeutic intervention in order to improve the following deficits and impairments:  Pain, Decreased activity tolerance, Decreased mobility, Decreased range of motion, Decreased strength  Visit Diagnosis: Bilateral low back pain without sciatica  Muscle weakness (generalized)     Problem List Patient Active Problem List   Diagnosis Date Noted  . Lumbosacral spondylosis  with radiculopathy 07/02/2015    Ahmed Prima, PTA 09/13/2015 12:05 PM  [facility] [place], [place], [address] Phone: [phone]   Fax:  [phone]  Name: Raedawn Wolper MRN: [id number] Date of Birth: Feb 14, 1956    Madelyn Flavors, PT 09/13/2015 12:22 PM [facility] [address] [place], [place], [address] Phone: [phone]   Fax:  [phone]

## 2015-09-17 DIAGNOSIS — Z1231 Encounter for screening mammogram for malignant neoplasm of breast: Secondary | ICD-10-CM | POA: Diagnosis not present

## 2015-09-18 ENCOUNTER — Encounter: Payer: Self-pay | Admitting: Physical Therapy

## 2015-09-18 ENCOUNTER — Ambulatory Visit: Payer: BLUE CROSS/BLUE SHIELD | Admitting: Physical Therapy

## 2015-09-18 DIAGNOSIS — M6281 Muscle weakness (generalized): Secondary | ICD-10-CM

## 2015-09-18 DIAGNOSIS — M545 Low back pain, unspecified: Secondary | ICD-10-CM

## 2015-09-18 DIAGNOSIS — M4317 Spondylolisthesis, lumbosacral region: Secondary | ICD-10-CM | POA: Diagnosis not present

## 2015-09-18 NOTE — Therapy (Signed)
[facility] [place], [place], [address] Phone: [phone]   Fax:  [phone]  Physical Therapy Treatment  Patient Details  Name: Lisa Stephens MRN: [id number] Date of Birth: [birth year] Referring Provider: Cyndy Freeze MD.  Encounter Date: 09/18/2015      PT End of Session - 09/18/15 1427    Visit Number 15   Number of Visits 16   Date for PT Re-Evaluation 09/17/15   PT Start Time 8811   PT Stop Time 1505   PT Time Calculation (min) 32 min   Equipment Utilized During Treatment Back brace   Activity Tolerance Patient tolerated treatment well   Behavior During Therapy [facility] for tasks assessed/performed      Past Medical History  Diagnosis Date  . Hypertension   . Shortness of breath dyspnea   . Hypothyroidism   . Diabetes mellitus without complication ([place])   . Depression   . Arthritis     Past Surgical History  Procedure Laterality Date  . Abdominal hysterectomy    . Tubal ligation    . Maximum access (mas)posterior lumbar interbody fusion (plif) 2 level N/A 07/02/2015    Procedure: L3-4 L4-5 Maximum access posterior lumbar interbody fusion;  Surgeon: Kevan Ny Ditty, MD;  Location: [facility];  Service: Neurosurgery;  Laterality: N/A;  L3-4 L4-5 Maximum access posterior lumbar interbody fusion    There were no vitals filed for this visit.      Subjective Assessment - 09/18/15 1427    Subjective States that MD requires her to have surgery next Wednesday 09/26/2015 as he states her R ankle deficits are from disc laying on the nerve. Reports that she would not get modalities today.   Limitations Walking;Sitting;Standing   How long can you sit comfortably? no limitations   How long can you stand comfortably? 10 minutes.   How long can you walk comfortably? Short distances.   Currently in Pain? No/denies            [facility] PT Assessment - 09/18/15 0001    Assessment   Medical Diagnosis  Spondylolisthesis, lumbosacrall region.   Onset Date/Surgical Date 07/02/15   Restrictions   Weight Bearing Restrictions No                     [facility] Adult PT Treatment/Exercise - 09/18/15 0001    Lumbar Exercises: Aerobic   Stationary Bike L2 x15 min   Lumbar Exercises: Standing   Wall Slides 15 reps   Row Strengthening;Both;Other (comment)  Pink XTS x30 reps   Shoulder Extension Strengthening;Both  x30 reps with Pink XTS   Other Standing Lumbar Exercises BLE forward 8" and lateral step up 6"    Lumbar Exercises: Seated   Long Arc Quad on Chair Strengthening;Both;3 sets;10 reps;Weights   LAQ on Chair Weights (lbs) 4                     PT Long Term Goals - 09/18/15 1506    PT LONG TERM GOAL #1   Title Ind with a HEP.   Time 8   Period Weeks   Status Achieved   PT LONG TERM GOAL #2   Title Sit 30 minutes with pain not > 3/10.   Time 8   Period Weeks   Status Achieved   PT LONG TERM GOAL #3   Title Stand 30 minutes with pain not > 3/10.   Time 8   Period Weeks  Status Achieved   PT LONG TERM GOAL #4   Title Sleep 6 hours undisturbed.   Time 8   Period Weeks   Status Achieved   PT LONG TERM GOAL #5   Title Walk a community distance without assistive device.   Time 8   Period Weeks   Status Achieved   PT LONG TERM GOAL #6   Title Increase right ankle dorsiflexion strength to 4/5 to increase toe clearance while walking.   Time 8   Period Weeks   Status Not Met  3-/5 R ankle DF MMT 09/13/2015               Plan - 09/18/15 1506    Clinical Impression Statement Patient has progressed well iwth PT followng her lumbar fusion that was completed 07/02/2015 and has achieved most of LT goals set at evluation except for R ankle goal. Patient arrived to treatment dissapointed in news that she will have another surgery that is schedules for 09/26/2015. Patient denied modalities today to which she described as she will most likely be back in  clinic following next week's surgery. Continues to have an antalgic gait with ambulation without AD and deficits with R ankle strength as surgeon reported nerve impingement in lumbar spine. Patient tolerated today's exercises well although she experienced faitgue.   Rehab Potential Good   Clinical Impairments Affecting Rehab Potential Fusion surgery 07-02-15   PT Frequency 2x / week   PT Duration 8 weeks   PT Treatment/Interventions ADLs/Self Care Home Management;Electrical Stimulation;Moist Heat;Therapeutic exercise;Therapeutic activities;Gait training;Neuromuscular re-education;Patient/family education;Manual techniques;Passive range of motion   PT Next Visit Plan D/C summary required secondary to surgery next week.   PT Home Exercise Plan standing hip ABD   Consulted and Agree with Plan of Care Patient      Patient will benefit from skilled therapeutic intervention in order to improve the following deficits and impairments:  Pain, Decreased activity tolerance, Decreased mobility, Decreased range of motion, Decreased strength  Visit Diagnosis: Bilateral low back pain without sciatica  Muscle weakness (generalized)     Problem List Patient Active Problem List   Diagnosis Date Noted  . Lumbosacral spondylosis with radiculopathy 07/02/2015   PHYSICAL THERAPY DISCHARGE SUMMARY  Visits from Start of Care: 15  Current functional level related to goals / functional outcomes: Please see above.   Remaining deficits: Continued right LE    Education / Equipment: HEP.  Patient scheduled to have another surgery.  Plan: Patient agrees to discharge.  Patient goals were partially met. Patient is being discharged due to the physician's request.  ?????       Shellby Schlink, [place] MPT 09/18/2015, 3:36 PM  [facility] [place], [place], [address] Phone: [phone]   Fax:  [phone]  Name: Lisa Stephens MRN: [id number] Date of  Birth: 02-Apr-1956

## 2015-09-18 NOTE — Therapy (Signed)
[facility] [place], [place], [address] Phone: [phone]   Fax:  [phone]  Physical Therapy Treatment  Patient Details  Name: Lisa Stephens MRN: [id number] Date of Birth: [birth year] Referring Provider: Cyndy Freeze MD.  Encounter Date: 09/18/2015      PT End of Session - 09/18/15 1427    Visit Number 15   Number of Visits 16   Date for PT Re-Evaluation 09/17/15   PT Start Time 8811   PT Stop Time 1505   PT Time Calculation (min) 32 min   Equipment Utilized During Treatment Back brace   Activity Tolerance Patient tolerated treatment well   Behavior During Therapy [facility] for tasks assessed/performed      Past Medical History  Diagnosis Date  . Hypertension   . Shortness of breath dyspnea   . Hypothyroidism   . Diabetes mellitus without complication ([place])   . Depression   . Arthritis     Past Surgical History  Procedure Laterality Date  . Abdominal hysterectomy    . Tubal ligation    . Maximum access (mas)posterior lumbar interbody fusion (plif) 2 level N/A 07/02/2015    Procedure: L3-4 L4-5 Maximum access posterior lumbar interbody fusion;  Surgeon: Kevan Ny Ditty, MD;  Location: [facility];  Service: Neurosurgery;  Laterality: N/A;  L3-4 L4-5 Maximum access posterior lumbar interbody fusion    There were no vitals filed for this visit.      Subjective Assessment - 09/18/15 1427    Subjective States that MD requires her to have surgery next Wednesday 09/26/2015 as he states her R ankle deficits are from disc laying on the nerve. Reports that she would not get modalities today.   Limitations Walking;Sitting;Standing   How long can you sit comfortably? no limitations   How long can you stand comfortably? 10 minutes.   How long can you walk comfortably? Short distances.   Currently in Pain? No/denies            [facility] PT Assessment - 09/18/15 0001    Assessment   Medical Diagnosis  Spondylolisthesis, lumbosacrall region.   Onset Date/Surgical Date 07/02/15   Restrictions   Weight Bearing Restrictions No                     [facility] Adult PT Treatment/Exercise - 09/18/15 0001    Lumbar Exercises: Aerobic   Stationary Bike L2 x15 min   Lumbar Exercises: Standing   Wall Slides 15 reps   Row Strengthening;Both;Other (comment)  Pink XTS x30 reps   Shoulder Extension Strengthening;Both  x30 reps with Pink XTS   Other Standing Lumbar Exercises BLE forward 8" and lateral step up 6"    Lumbar Exercises: Seated   Long Arc Quad on Chair Strengthening;Both;3 sets;10 reps;Weights   LAQ on Chair Weights (lbs) 4                     PT Long Term Goals - 09/18/15 1506    PT LONG TERM GOAL #1   Title Ind with a HEP.   Time 8   Period Weeks   Status Achieved   PT LONG TERM GOAL #2   Title Sit 30 minutes with pain not > 3/10.   Time 8   Period Weeks   Status Achieved   PT LONG TERM GOAL #3   Title Stand 30 minutes with pain not > 3/10.   Time 8   Period Weeks  Status Achieved   PT LONG TERM GOAL #4   Title Sleep 6 hours undisturbed.   Time 8   Period Weeks   Status Achieved   PT LONG TERM GOAL #5   Title Walk a community distance without assistive device.   Time 8   Period Weeks   Status Achieved   PT LONG TERM GOAL #6   Title Increase right ankle dorsiflexion strength to 4/5 to increase toe clearance while walking.   Time 8   Period Weeks   Status Not Met  3-/5 R ankle DF MMT 09/13/2015               Plan - 09/18/15 1506    Clinical Impression Statement Patient has progressed well iwth PT followng her lumbar fusion that was completed 07/02/2015 and has achieved most of LT goals set at evluation except for R ankle goal. Patient arrived to treatment dissapointed in news that she will have another surgery that is schedules for 09/26/2015. Patient denied modalities today to which she described as she will most likely be back in  clinic following next week's surgery. Continues to have an antalgic gait with ambulation without AD and deficits with R ankle strength as surgeon reported nerve impingement in lumbar spine. Patient tolerated today's exercises well although she experienced faitgue.   Rehab Potential Good   Clinical Impairments Affecting Rehab Potential Fusion surgery 07-02-15   PT Frequency 2x / week   PT Duration 8 weeks   PT Treatment/Interventions ADLs/Self Care Home Management;Electrical Stimulation;Moist Heat;Therapeutic exercise;Therapeutic activities;Gait training;Neuromuscular re-education;Patient/family education;Manual techniques;Passive range of motion   PT Next Visit Plan D/C summary required secondary to surgery next week.   PT Home Exercise Plan standing hip ABD   Consulted and Agree with Plan of Care Patient      Patient will benefit from skilled therapeutic intervention in order to improve the following deficits and impairments:  Pain, Decreased activity tolerance, Decreased mobility, Decreased range of motion, Decreased strength  Visit Diagnosis: Bilateral low back pain without sciatica  Muscle weakness (generalized)     Problem List Patient Active Problem List   Diagnosis Date Noted  . Lumbosacral spondylosis with radiculopathy 07/02/2015    Ahmed Prima, PTA 09/18/2015 3:11 PM  [facility] [place], [place], [address] Phone: [phone]   Fax:  [phone]  Name: Lisa Stephens MRN: [id number] Date of Birth: [birth year]

## 2015-09-19 ENCOUNTER — Other Ambulatory Visit: Payer: Self-pay | Admitting: Neurological Surgery

## 2015-09-20 ENCOUNTER — Encounter (HOSPITAL_COMMUNITY): Payer: Self-pay

## 2015-09-20 ENCOUNTER — Encounter: Payer: BLUE CROSS/BLUE SHIELD | Admitting: Physical Therapy

## 2015-09-20 ENCOUNTER — Encounter (HOSPITAL_COMMUNITY)
Admission: RE | Admit: 2015-09-20 | Discharge: 2015-09-20 | Disposition: A | Discharge status: Home / Self Care | Payer: BLUE CROSS/BLUE SHIELD | Source: Ambulatory Visit | Attending: Neurological Surgery | Admitting: Neurological Surgery

## 2015-09-20 DIAGNOSIS — Z01812 Encounter for preprocedural laboratory examination: Secondary | ICD-10-CM | POA: Insufficient documentation

## 2015-09-20 DIAGNOSIS — M5416 Radiculopathy, lumbar region: Secondary | ICD-10-CM | POA: Diagnosis not present

## 2015-09-20 LAB — CBC
HCT: 35.8 % — ABNORMAL LOW (ref 36.0–46.0)
Hemoglobin: 11.5 g/dL — ABNORMAL LOW (ref 12.0–15.0)
MCH: 27.1 pg (ref 26.0–34.0)
MCHC: 32.1 g/dL (ref 30.0–36.0)
MCV: 84.4 fL (ref 78.0–100.0)
Platelets: 313 10*3/uL (ref 150–400)
RBC: 4.24 MIL/uL (ref 3.87–5.11)
RDW: 14.4 % (ref 11.5–15.5)
WBC: 8.1 10*3/uL (ref 4.0–10.5)

## 2015-09-20 LAB — BASIC METABOLIC PANEL
Anion gap: 9 (ref 5–15)
BUN: 13 mg/dL (ref 6–20)
CHLORIDE: 106 mmol/L (ref 101–111)
CO2: 24 mmol/L (ref 22–32)
CREATININE: 0.71 mg/dL (ref 0.44–1.00)
Calcium: 9.3 mg/dL (ref 8.9–10.3)
Glucose, Bld: 131 mg/dL — ABNORMAL HIGH (ref 65–99)
POTASSIUM: 3.7 mmol/L (ref 3.5–5.1)
SODIUM: 139 mmol/L (ref 135–145)

## 2015-09-20 LAB — SURGICAL PCR SCREEN
MRSA, PCR: NEGATIVE
STAPHYLOCOCCUS AUREUS: NEGATIVE

## 2015-09-20 LAB — GLUCOSE, CAPILLARY: Glucose-Capillary: 149 mg/dL — ABNORMAL HIGH (ref 65–99)

## 2015-09-20 NOTE — Pre-Procedure Instructions (Signed)
Lisa Stephens  09/20/2015      [facility] - [place], [name] - [address] [name] Stephens [address] Phone: [phone] Fax: [phone]  [place], [place] [address] [place] [address] [place] [address] Phone: [phone] Fax: [phone]    Your procedure is scheduled on   Wednesday  09/26/15  Report to [facility] Admitting at [facility]  A.M.  Call this number if you have problems the morning of surgery:  [phone]   Remember:  Do not eat food or drink liquids after midnight.  Take these medicines the morning of surgery with A SIP OF WATER    DIAZEPAM IF NEEDED ,GABAPENTIN (NEURONTIN), LEVOTHYROXINE (SYNTHROID)        (STOP ASPIRIN,  OMEGA 3 FISH OIL,  IBUPROFEN/ ADVIL/ MOTRIN , GOODY POWDERS/ BC'S ,HERBAL MEDICINES)   How to Manage Your Diabetes Before and After Surgery  Why is it important to control my blood sugar before and after surgery? . Improving blood sugar levels before and after surgery helps healing and can limit problems. . A way of improving blood sugar control is eating a healthy diet by: o  Eating less sugar and carbohydrates o  Increasing activity/exercise o  Talking with your doctor about reaching your blood sugar goals . High blood sugars (greater than 180 mg/dL) can raise your risk of infections and slow your recovery, so you will need to focus on controlling your diabetes during the weeks before surgery. . Make sure that the doctor who takes care of your diabetes knows about your planned surgery including the date and location.  How do I manage my blood sugar before surgery? . Check your blood sugar at least 4 times a day, starting 2 days before surgery, to make sure that the level is not too high or low. o Check your blood sugar the morning of your surgery when you wake up and every 2 hours until you get to the Short Stay unit. . If your blood sugar is less than 70  mg/dL, you will need to treat for low blood sugar: o Do not take insulin. o Treat a low blood sugar (less than 70 mg/dL) with  cup of clear juice (cranberry or apple), 4 glucose tablets, OR glucose gel. o Recheck blood sugar in 15 minutes after treatment (to make sure it is greater than 70 mg/dL). If your blood sugar is not greater than 70 mg/dL on recheck, call [phone] for further instructions. . Report your blood sugar to the short stay nurse when you get to Short Stay.  . If you are admitted to the hospital after surgery: o Your blood sugar will be checked by the staff and you will probably be given insulin after surgery (instead of oral diabetes medicines) to make sure you have good blood sugar levels. o The goal for blood sugar control after surgery is 80-180 mg/dL.              WHAT DO I DO ABOUT MY DIABETES MEDICATION?   Lisa Stephens Kitchen Do not take oral diabetes medicines (pills) the morning of surgery.       .   Other Instructions:          Patient Signature:  Date:   Nurse Signature:  Date:   Reviewed and Endorsed by [facility] Patient Education Committee, August 2015  Do not wear jewelry, make-up or nail polish.  Do not  wear lotions, powders, or perfumes.  You may wear deoderant.  Do not shave 48 hours prior to surgery.  Men may shave face and neck.  Do not bring valuables to the hospital.  [facility] is not responsible for any belongings or valuables.  Contacts, dentures or bridgework may not be worn into surgery.  Leave your suitcase in the car.  After surgery it may be brought to your room.  For patients admitted to the hospital, discharge time will be determined by your treatment team.  Patients discharged the day of surgery will not be allowed to drive home.   Name and phone number of your driver:    SPECIAL instructions:    SEE PREPARING FOR SURGERY  Please read over the following fact sheets that you were given. Pain Booklet, Coughing and Deep  Breathing, MRSA Information and Surgical Site Infection Prevention

## 2015-09-21 ENCOUNTER — Other Ambulatory Visit: Payer: Self-pay | Admitting: Neurological Surgery

## 2015-09-21 DIAGNOSIS — M5416 Radiculopathy, lumbar region: Secondary | ICD-10-CM

## 2015-09-21 LAB — HEMOGLOBIN A1C
HEMOGLOBIN A1C: 6.2 % — AB (ref 4.8–5.6)
MEAN PLASMA GLUCOSE: 131 mg/dL

## 2015-09-24 ENCOUNTER — Ambulatory Visit
Admission: RE | Admit: 2015-09-24 | Discharge: 2015-09-24 | Disposition: A | Discharge status: Home / Self Care | Payer: BLUE CROSS/BLUE SHIELD | Source: Ambulatory Visit | Attending: Neurological Surgery | Admitting: Neurological Surgery

## 2015-09-24 DIAGNOSIS — M5126 Other intervertebral disc displacement, lumbar region: Secondary | ICD-10-CM | POA: Diagnosis not present

## 2015-09-24 DIAGNOSIS — M5416 Radiculopathy, lumbar region: Secondary | ICD-10-CM

## 2015-09-25 MED ORDER — ACETAMINOPHEN 500 MG PO TABS
1000.0000 mg | ORAL_TABLET | ORAL | Status: DC
  Filled 2015-09-25: qty 2

## 2015-09-25 MED ORDER — CEFAZOLIN SODIUM-DEXTROSE 2-4 GM/100ML-% IV SOLN
2.0000 g | INTRAVENOUS | Status: AC
  Administered 2015-09-26: 2 g via INTRAVENOUS
  Filled 2015-09-25: qty 100

## 2015-09-26 ENCOUNTER — Encounter (HOSPITAL_COMMUNITY): Payer: Self-pay | Admitting: *Deleted

## 2015-09-26 ENCOUNTER — Inpatient Hospital Stay (HOSPITAL_COMMUNITY): Payer: BLUE CROSS/BLUE SHIELD

## 2015-09-26 ENCOUNTER — Inpatient Hospital Stay (HOSPITAL_COMMUNITY): Payer: BLUE CROSS/BLUE SHIELD | Admitting: Anesthesiology

## 2015-09-26 ENCOUNTER — Encounter (HOSPITAL_COMMUNITY)
Admission: RE | Disposition: A | Discharge status: Home / Self Care | Payer: Self-pay | Source: Ambulatory Visit | Attending: Neurological Surgery

## 2015-09-26 ENCOUNTER — Inpatient Hospital Stay (HOSPITAL_COMMUNITY)
Admission: RE | Admit: 2015-09-26 | Discharge: 2015-09-27 | DRG: 460 | Disposition: A | Discharge status: Home / Self Care | Payer: BLUE CROSS/BLUE SHIELD | Source: Ambulatory Visit | Attending: Neurological Surgery | Admitting: Neurological Surgery

## 2015-09-26 DIAGNOSIS — F329 Major depressive disorder, single episode, unspecified: Secondary | ICD-10-CM | POA: Diagnosis not present

## 2015-09-26 DIAGNOSIS — E119 Type 2 diabetes mellitus without complications: Secondary | ICD-10-CM | POA: Diagnosis not present

## 2015-09-26 DIAGNOSIS — Z79899 Other long term (current) drug therapy: Secondary | ICD-10-CM

## 2015-09-26 DIAGNOSIS — T84226A Displacement of internal fixation device of vertebrae, initial encounter: Secondary | ICD-10-CM | POA: Diagnosis not present

## 2015-09-26 DIAGNOSIS — M1990 Unspecified osteoarthritis, unspecified site: Secondary | ICD-10-CM | POA: Diagnosis not present

## 2015-09-26 DIAGNOSIS — M21371 Foot drop, right foot: Secondary | ICD-10-CM | POA: Diagnosis not present

## 2015-09-26 DIAGNOSIS — M5416 Radiculopathy, lumbar region: Secondary | ICD-10-CM | POA: Diagnosis not present

## 2015-09-26 DIAGNOSIS — I10 Essential (primary) hypertension: Secondary | ICD-10-CM | POA: Diagnosis present

## 2015-09-26 DIAGNOSIS — M71371 Other bursal cyst, right ankle and foot: Secondary | ICD-10-CM | POA: Diagnosis not present

## 2015-09-26 DIAGNOSIS — Z981 Arthrodesis status: Secondary | ICD-10-CM | POA: Diagnosis not present

## 2015-09-26 DIAGNOSIS — E039 Hypothyroidism, unspecified: Secondary | ICD-10-CM | POA: Diagnosis present

## 2015-09-26 DIAGNOSIS — Z7984 Long term (current) use of oral hypoglycemic drugs: Secondary | ICD-10-CM

## 2015-09-26 DIAGNOSIS — T84216A Breakdown (mechanical) of internal fixation device of vertebrae, initial encounter: Secondary | ICD-10-CM | POA: Diagnosis not present

## 2015-09-26 DIAGNOSIS — Z7982 Long term (current) use of aspirin: Secondary | ICD-10-CM | POA: Diagnosis not present

## 2015-09-26 DIAGNOSIS — Z419 Encounter for procedure for purposes other than remedying health state, unspecified: Secondary | ICD-10-CM

## 2015-09-26 LAB — GLUCOSE, CAPILLARY
GLUCOSE-CAPILLARY: 94 mg/dL (ref 65–99)
GLUCOSE-CAPILLARY: 101 mg/dL — AB (ref 65–99)
GLUCOSE-CAPILLARY: 152 mg/dL — AB (ref 65–99)
Glucose-Capillary: 100 mg/dL — ABNORMAL HIGH (ref 65–99)

## 2015-09-26 SURGERY — POSTERIOR LUMBAR FUSION 1 LEVEL
Anesthesia: General | Site: Back

## 2015-09-26 MED ORDER — FLEET ENEMA 7-19 GM/118ML RE ENEM: 1.0000 | ENEMA | Freq: Once | RECTAL | Status: DC | PRN

## 2015-09-26 MED ORDER — ASPIRIN EC 81 MG PO TBEC
81.0000 mg | DELAYED_RELEASE_TABLET | Freq: Every day | ORAL | Status: DC
  Administered 2015-09-26 – 2015-09-27 (×2): 81 mg via ORAL
  Filled 2015-09-26 (×2): qty 1

## 2015-09-26 MED ORDER — METFORMIN HCL 500 MG PO TABS
500.0000 mg | ORAL_TABLET | Freq: Every day | ORAL | Status: DC
  Administered 2015-09-27: 500 mg via ORAL
  Filled 2015-09-26: qty 1

## 2015-09-26 MED ORDER — ZOLPIDEM TARTRATE 5 MG PO TABS: 5.0000 mg | ORAL_TABLET | Freq: Every evening | ORAL | Status: DC | PRN

## 2015-09-26 MED ORDER — BUPIVACAINE LIPOSOME 1.3 % IJ SUSP
20.0000 mL | Freq: Once | INTRAMUSCULAR | Status: DC
  Filled 2015-09-26: qty 20

## 2015-09-26 MED ORDER — ATORVASTATIN CALCIUM 20 MG PO TABS
20.0000 mg | ORAL_TABLET | ORAL | Status: DC
  Administered 2015-09-26: 20 mg via ORAL
  Filled 2015-09-26: qty 1

## 2015-09-26 MED ORDER — IRBESARTAN 300 MG PO TABS
300.0000 mg | ORAL_TABLET | Freq: Every day | ORAL | Status: DC
  Administered 2015-09-27: 300 mg via ORAL
  Filled 2015-09-26: qty 1

## 2015-09-26 MED ORDER — BACITRACIN 50000 UNITS IM SOLR
INTRAMUSCULAR | Status: DC | PRN
  Administered 2015-09-26: 17:00:00

## 2015-09-26 MED ORDER — HYDROMORPHONE HCL 1 MG/ML IJ SOLN
0.2500 mg | INTRAMUSCULAR | Status: DC | PRN
  Administered 2015-09-26 (×2): 0.5 mg via INTRAVENOUS

## 2015-09-26 MED ORDER — PROPOFOL 10 MG/ML IV BOLUS
INTRAVENOUS | Status: AC
  Filled 2015-09-26: qty 20

## 2015-09-26 MED ORDER — OXYCODONE HCL ER 15 MG PO T12A
30.0000 mg | EXTENDED_RELEASE_TABLET | Freq: Two times a day (BID) | ORAL | Status: DC
  Administered 2015-09-26 – 2015-09-27 (×2): 30 mg via ORAL
  Filled 2015-09-26 (×2): qty 2

## 2015-09-26 MED ORDER — HYDROMORPHONE HCL 1 MG/ML IJ SOLN
INTRAMUSCULAR | Status: AC
  Administered 2015-09-26: 0.5 mg via INTRAVENOUS
  Filled 2015-09-26: qty 1

## 2015-09-26 MED ORDER — AMLODIPINE-OLMESARTAN 10-40 MG PO TABS: 1.0000 | ORAL_TABLET | Freq: Every day | ORAL | Status: DC

## 2015-09-26 MED ORDER — METHOCARBAMOL 750 MG PO TABS
750.0000 mg | ORAL_TABLET | Freq: Four times a day (QID) | ORAL | Status: DC
  Administered 2015-09-27: 750 mg via ORAL
  Filled 2015-09-26 (×2): qty 1

## 2015-09-26 MED ORDER — PROPOFOL 10 MG/ML IV BOLUS
INTRAVENOUS | Status: DC | PRN
  Administered 2015-09-26: 150 mg via INTRAVENOUS

## 2015-09-26 MED ORDER — CEFAZOLIN IN D5W 1 GM/50ML IV SOLN
1.0000 g | Freq: Three times a day (TID) | INTRAVENOUS | Status: AC
  Administered 2015-09-26 – 2015-09-27 (×2): 1 g via INTRAVENOUS
  Filled 2015-09-26 (×2): qty 50

## 2015-09-26 MED ORDER — BISACODYL 5 MG PO TBEC: 5.0000 mg | DELAYED_RELEASE_TABLET | Freq: Every day | ORAL | Status: DC | PRN

## 2015-09-26 MED ORDER — ONDANSETRON HCL 4 MG/2ML IJ SOLN
INTRAMUSCULAR | Status: DC | PRN
  Administered 2015-09-26: 4 mg via INTRAVENOUS

## 2015-09-26 MED ORDER — BUPIVACAINE-EPINEPHRINE (PF) 0.5% -1:200000 IJ SOLN
INTRAMUSCULAR | Status: DC | PRN
  Administered 2015-09-26: 10 mL

## 2015-09-26 MED ORDER — PHENOL 1.4 % MT LIQD: 1.0000 | OROMUCOSAL | Status: DC | PRN

## 2015-09-26 MED ORDER — GABAPENTIN 300 MG PO CAPS
300.0000 mg | ORAL_CAPSULE | ORAL | Status: DC
  Filled 2015-09-26: qty 1

## 2015-09-26 MED ORDER — LACTATED RINGERS IV SOLN
INTRAVENOUS | Status: DC
  Administered 2015-09-26 (×2): via INTRAVENOUS

## 2015-09-26 MED ORDER — FENTANYL CITRATE (PF) 250 MCG/5ML IJ SOLN
INTRAMUSCULAR | Status: AC
  Filled 2015-09-26: qty 5

## 2015-09-26 MED ORDER — DIAZEPAM 5 MG PO TABS
5.0000 mg | ORAL_TABLET | Freq: Three times a day (TID) | ORAL | Status: DC | PRN
  Administered 2015-09-26: 5 mg via ORAL

## 2015-09-26 MED ORDER — MENTHOL 3 MG MT LOZG: 1.0000 | LOZENGE | OROMUCOSAL | Status: DC | PRN

## 2015-09-26 MED ORDER — THROMBIN 20000 UNITS EX SOLR
CUTANEOUS | Status: DC | PRN
  Administered 2015-09-26: 17:00:00 via TOPICAL

## 2015-09-26 MED ORDER — SODIUM CHLORIDE 0.9 % IV SOLN
INTRAVENOUS | Status: DC | PRN
  Administered 2015-09-26: 30 mL

## 2015-09-26 MED ORDER — SODIUM CHLORIDE 0.9% FLUSH: 3.0000 mL | Freq: Two times a day (BID) | INTRAVENOUS | Status: DC

## 2015-09-26 MED ORDER — AMLODIPINE BESYLATE 10 MG PO TABS
10.0000 mg | ORAL_TABLET | Freq: Every day | ORAL | Status: DC
  Administered 2015-09-27: 10 mg via ORAL
  Filled 2015-09-26: qty 1

## 2015-09-26 MED ORDER — OXYCODONE HCL 5 MG PO TABS
5.0000 mg | ORAL_TABLET | ORAL | Status: DC | PRN
  Administered 2015-09-26: 5 mg via ORAL

## 2015-09-26 MED ORDER — OXYCODONE HCL 5 MG PO TABS: 5.0000 mg | ORAL_TABLET | ORAL | Status: DC | PRN

## 2015-09-26 MED ORDER — VANCOMYCIN HCL 1000 MG IV SOLR
INTRAVENOUS | Status: DC | PRN
  Administered 2015-09-26: 1000 mg via TOPICAL

## 2015-09-26 MED ORDER — VANCOMYCIN HCL 1000 MG IV SOLR
INTRAVENOUS | Status: AC
  Filled 2015-09-26: qty 1000

## 2015-09-26 MED ORDER — 0.9 % SODIUM CHLORIDE (POUR BTL) OPTIME
TOPICAL | Status: DC | PRN
  Administered 2015-09-26: 1000 mL

## 2015-09-26 MED ORDER — LIDOCAINE-EPINEPHRINE 2 %-1:100000 IJ SOLN
INTRAMUSCULAR | Status: DC | PRN
  Administered 2015-09-26: 10 mL

## 2015-09-26 MED ORDER — DOCUSATE SODIUM 100 MG PO CAPS: 100.0000 mg | ORAL_CAPSULE | Freq: Two times a day (BID) | ORAL | Status: DC

## 2015-09-26 MED ORDER — ONDANSETRON HCL 4 MG/2ML IJ SOLN
INTRAMUSCULAR | Status: AC
  Filled 2015-09-26: qty 2

## 2015-09-26 MED ORDER — GABAPENTIN 300 MG PO CAPS
600.0000 mg | ORAL_CAPSULE | Freq: Three times a day (TID) | ORAL | Status: DC
  Administered 2015-09-26 – 2015-09-27 (×2): 600 mg via ORAL
  Filled 2015-09-26 (×2): qty 2

## 2015-09-26 MED ORDER — DIAZEPAM 5 MG PO TABS
ORAL_TABLET | ORAL | Status: AC
  Filled 2015-09-26: qty 1

## 2015-09-26 MED ORDER — SODIUM CHLORIDE 0.9 % IV SOLN: 250.0000 mL | INTRAVENOUS | Status: DC

## 2015-09-26 MED ORDER — ONDANSETRON HCL 4 MG/2ML IJ SOLN: 4.0000 mg | INTRAMUSCULAR | Status: DC | PRN

## 2015-09-26 MED ORDER — ROCURONIUM BROMIDE 100 MG/10ML IV SOLN
INTRAVENOUS | Status: DC | PRN
  Administered 2015-09-26: 40 mg via INTRAVENOUS

## 2015-09-26 MED ORDER — THROMBIN 5000 UNITS EX SOLR
OROMUCOSAL | Status: DC | PRN
  Administered 2015-09-26: 17:00:00 via TOPICAL

## 2015-09-26 MED ORDER — ARTIFICIAL TEARS OP OINT
TOPICAL_OINTMENT | OPHTHALMIC | Status: AC
  Filled 2015-09-26: qty 3.5

## 2015-09-26 MED ORDER — SUGAMMADEX SODIUM 200 MG/2ML IV SOLN
INTRAVENOUS | Status: AC
  Filled 2015-09-26: qty 2

## 2015-09-26 MED ORDER — FENTANYL CITRATE (PF) 100 MCG/2ML IJ SOLN
INTRAMUSCULAR | Status: DC | PRN
  Administered 2015-09-26: 50 ug via INTRAVENOUS
  Administered 2015-09-26: 100 ug via INTRAVENOUS
  Administered 2015-09-26 (×2): 50 ug via INTRAVENOUS

## 2015-09-26 MED ORDER — OXYCODONE HCL 5 MG PO TABS
ORAL_TABLET | ORAL | Status: AC
  Filled 2015-09-26: qty 1

## 2015-09-26 MED ORDER — MIDAZOLAM HCL 2 MG/2ML IJ SOLN
INTRAMUSCULAR | Status: AC
  Filled 2015-09-26: qty 2

## 2015-09-26 MED ORDER — CELECOXIB 200 MG PO CAPS
200.0000 mg | ORAL_CAPSULE | Freq: Two times a day (BID) | ORAL | Status: DC
  Administered 2015-09-26 – 2015-09-27 (×2): 200 mg via ORAL
  Filled 2015-09-26 (×2): qty 1

## 2015-09-26 MED ORDER — ROCURONIUM BROMIDE 50 MG/5ML IV SOLN
INTRAVENOUS | Status: AC
  Filled 2015-09-26: qty 1

## 2015-09-26 MED ORDER — ACETAMINOPHEN 500 MG PO TABS
1000.0000 mg | ORAL_TABLET | Freq: Four times a day (QID) | ORAL | Status: DC
  Administered 2015-09-26 – 2015-09-27 (×3): 1000 mg via ORAL
  Filled 2015-09-26 (×3): qty 2

## 2015-09-26 MED ORDER — METHOCARBAMOL 750 MG PO TABS
750.0000 mg | ORAL_TABLET | Freq: Four times a day (QID) | ORAL | Status: DC | PRN
  Administered 2015-09-26: 750 mg via ORAL

## 2015-09-26 MED ORDER — SENNA 8.6 MG PO TABS
1.0000 | ORAL_TABLET | Freq: Two times a day (BID) | ORAL | Status: DC
  Administered 2015-09-26 – 2015-09-27 (×2): 8.6 mg via ORAL
  Filled 2015-09-26 (×2): qty 1

## 2015-09-26 MED ORDER — MIDAZOLAM HCL 5 MG/5ML IJ SOLN
INTRAMUSCULAR | Status: DC | PRN
  Administered 2015-09-26: 2 mg via INTRAVENOUS

## 2015-09-26 MED ORDER — PROMETHAZINE HCL 25 MG/ML IJ SOLN: 6.2500 mg | INTRAMUSCULAR | Status: DC | PRN

## 2015-09-26 MED ORDER — LEVOTHYROXINE SODIUM 100 MCG PO TABS
100.0000 ug | ORAL_TABLET | Freq: Every day | ORAL | Status: DC
  Administered 2015-09-27: 100 ug via ORAL
  Filled 2015-09-26: qty 1

## 2015-09-26 MED ORDER — DOCUSATE SODIUM 100 MG PO CAPS
100.0000 mg | ORAL_CAPSULE | Freq: Two times a day (BID) | ORAL | Status: DC
  Administered 2015-09-26 – 2015-09-27 (×2): 100 mg via ORAL
  Filled 2015-09-26 (×2): qty 1

## 2015-09-26 MED ORDER — SODIUM CHLORIDE 0.9% FLUSH: 3.0000 mL | INTRAVENOUS | Status: DC | PRN

## 2015-09-26 MED ORDER — SODIUM CHLORIDE 0.9 % IV SOLN: INTRAVENOUS | Status: DC

## 2015-09-26 MED ORDER — LIDOCAINE HCL (CARDIAC) 20 MG/ML IV SOLN
INTRAVENOUS | Status: DC | PRN
  Administered 2015-09-26: 60 mg via INTRAVENOUS

## 2015-09-26 SURGICAL SUPPLY — 82 items
ADH SKN CLS APL DERMABOND .7 (GAUZE/BANDAGES/DRESSINGS) ×1
APL SKNCLS STERI-STRIP NONHPOA (GAUZE/BANDAGES/DRESSINGS) ×1
BAG DECANTER FOR FLEXI CONT (MISCELLANEOUS) ×2 IMPLANT
BENZOIN TINCTURE PRP APPL 2/3 (GAUZE/BANDAGES/DRESSINGS) ×2 IMPLANT
BLADE CLIPPER SURG (BLADE) IMPLANT
BONE MATRIX OSTEOCEL PRO MED (Bone Implant) ×1 IMPLANT
BUR MATCHSTICK NEURO 3.0 LAGG (BURR) ×2 IMPLANT
BUR ROUND FLUTED 5 RND (BURR) ×1 IMPLANT
CANISTER SUCT 3000ML PPV (MISCELLANEOUS) ×4 IMPLANT
CHLORAPREP W/TINT 26ML (MISCELLANEOUS) ×2 IMPLANT
CONT SPEC 4OZ CLIKSEAL STRL BL (MISCELLANEOUS) ×6 IMPLANT
DECANTER SPIKE VIAL GLASS SM (MISCELLANEOUS) ×2 IMPLANT
DERMABOND ADVANCED (GAUZE/BANDAGES/DRESSINGS) ×1
DERMABOND ADVANCED .7 DNX12 (GAUZE/BANDAGES/DRESSINGS) ×1 IMPLANT
DRAPE C-ARM 42X72 X-RAY (DRAPES) ×4 IMPLANT
DRAPE MICROSCOPE LEICA (MISCELLANEOUS) IMPLANT
DRAPE POUCH INSTRU U-SHP 10X18 (DRAPES) ×2 IMPLANT
DRAPE SURG 17X23 STRL (DRAPES) ×2 IMPLANT
DRSG OPSITE POSTOP 4X6 (GAUZE/BANDAGES/DRESSINGS) ×1 IMPLANT
ELECT BLADE 4.0 EZ CLEAN MEGAD (MISCELLANEOUS) ×2
ELECT REM PT RETURN 9FT ADLT (ELECTROSURGICAL) ×2
ELECTRODE BLDE 4.0 EZ CLN MEGD (MISCELLANEOUS) IMPLANT
ELECTRODE REM PT RTRN 9FT ADLT (ELECTROSURGICAL) ×1 IMPLANT
GAUZE SPONGE 4X4 12PLY STRL (GAUZE/BANDAGES/DRESSINGS) IMPLANT
GAUZE SPONGE 4X4 16PLY XRAY LF (GAUZE/BANDAGES/DRESSINGS) IMPLANT
GLOVE BIO SURGEON STRL SZ 6.5 (GLOVE) ×1 IMPLANT
GLOVE BIO SURGEON STRL SZ8 (GLOVE) ×2 IMPLANT
GLOVE BIOGEL PI IND STRL 6.5 (GLOVE) IMPLANT
GLOVE BIOGEL PI IND STRL 7.0 (GLOVE) ×2 IMPLANT
GLOVE BIOGEL PI IND STRL 7.5 (GLOVE) ×2 IMPLANT
GLOVE BIOGEL PI IND STRL 8 (GLOVE) ×1 IMPLANT
GLOVE BIOGEL PI INDICATOR 6.5 (GLOVE) ×1
GLOVE BIOGEL PI INDICATOR 7.0 (GLOVE) ×2
GLOVE BIOGEL PI INDICATOR 7.5 (GLOVE) ×1
GLOVE BIOGEL PI INDICATOR 8 (GLOVE) ×1
GLOVE ECLIPSE 7.5 STRL STRAW (GLOVE) ×3 IMPLANT
GLOVE EXAM NITRILE LRG STRL (GLOVE) IMPLANT
GLOVE EXAM NITRILE MD LF STRL (GLOVE) IMPLANT
GLOVE EXAM NITRILE XL STR (GLOVE) IMPLANT
GLOVE EXAM NITRILE XS STR PU (GLOVE) IMPLANT
GLOVE INDICATOR 8.5 STRL (GLOVE) ×1 IMPLANT
GLOVE SS BIOGEL STRL SZ 7 (GLOVE) ×3 IMPLANT
GLOVE SUPERSENSE BIOGEL SZ 7 (GLOVE) ×3
GOWN STRL REUS W/ TWL LRG LVL3 (GOWN DISPOSABLE) ×2 IMPLANT
GOWN STRL REUS W/ TWL XL LVL3 (GOWN DISPOSABLE) IMPLANT
GOWN STRL REUS W/TWL LRG LVL3 (GOWN DISPOSABLE) ×2
GOWN STRL REUS W/TWL XL LVL3 (GOWN DISPOSABLE) ×2
HEMOSTAT POWDER KIT SURGIFOAM (HEMOSTASIS) ×2 IMPLANT
KIT BASIN OR (CUSTOM PROCEDURE TRAY) ×2 IMPLANT
KIT ROOM TURNOVER OR (KITS) ×2 IMPLANT
NDL HYPO 21X1.5 SAFETY (NEEDLE) ×2 IMPLANT
NEEDLE HYPO 21X1.5 SAFETY (NEEDLE) ×4 IMPLANT
NEEDLE HYPO 25X1 1.5 SAFETY (NEEDLE) ×2 IMPLANT
NS IRRIG 1000ML POUR BTL (IV SOLUTION) ×2 IMPLANT
PACK LAMINECTOMY NEURO (CUSTOM PROCEDURE TRAY) ×2 IMPLANT
PACK UNIVERSAL I (CUSTOM PROCEDURE TRAY) ×2 IMPLANT
PAD ARMBOARD 7.5X6 YLW CONV (MISCELLANEOUS) ×6 IMPLANT
PATTIES SURGICAL .5X1.5 (GAUZE/BANDAGES/DRESSINGS) ×2 IMPLANT
RUBBERBAND STERILE (MISCELLANEOUS) IMPLANT
SCREW LOCK (Screw) ×4 IMPLANT
SCREW LOCK FXNS SPNE MAS PL (Screw) ×2 IMPLANT
SPONGE NEURO XRAY DETECT 1X3 (DISPOSABLE) ×1 IMPLANT
SPONGE SURGIFOAM ABS GEL 100 (HEMOSTASIS) ×2 IMPLANT
STRIP SURGICAL 1 X 6 IN (GAUZE/BANDAGES/DRESSINGS) IMPLANT
STRIP SURGICAL 1/2 X 6 IN (GAUZE/BANDAGES/DRESSINGS) IMPLANT
STRIP SURGICAL 1/4 X 6 IN (GAUZE/BANDAGES/DRESSINGS) IMPLANT
STRIP SURGICAL 3/4 X 6 IN (GAUZE/BANDAGES/DRESSINGS) IMPLANT
SUT STRATAFIX MNCRL+ 3-0 PS-2 (SUTURE) ×1
SUT STRATAFIX MONOCRYL 3-0 (SUTURE) ×1
SUT VIC AB 0 CT1 18XCR BRD8 (SUTURE) ×2 IMPLANT
SUT VIC AB 0 CT1 8-18 (SUTURE) ×2
SUT VIC AB 2-0 CT1 18 (SUTURE) ×4 IMPLANT
SUT VIC AB 3-0 SH 8-18 (SUTURE) ×3 IMPLANT
SUT VIC AB 4-0 PS2 27 (SUTURE) ×2 IMPLANT
SUTURE STRATFX MNCRL+ 3-0 PS-2 (SUTURE) ×1 IMPLANT
SYR 20CC LL (SYRINGE) ×4 IMPLANT
SYR 30ML LL (SYRINGE) ×3 IMPLANT
SYR 3ML LL SCALE MARK (SYRINGE) ×2 IMPLANT
TOWEL OR 17X24 6PK STRL BLUE (TOWEL DISPOSABLE) ×2 IMPLANT
TOWEL OR 17X26 10 PK STRL BLUE (TOWEL DISPOSABLE) ×2 IMPLANT
TUBE CONNECTING 12X1/4 (SUCTIONS) ×2 IMPLANT
WATER STERILE IRR 1000ML POUR (IV SOLUTION) ×2 IMPLANT

## 2015-09-26 NOTE — Transfer of Care (Signed)
Immediate Anesthesia Transfer of Care Note  Patient: Lisa Stephens  Procedure(s) Performed: Procedure(s): Revision of Lumbar four-five Interbody cage (N/A)  Patient Location: PACU  Anesthesia Type:General  Level of Consciousness: awake, alert , oriented and patient cooperative  Airway & Oxygen Therapy: Patient Spontanous Breathing and Patient connected to nasal cannula oxygen  Post-op Assessment: Report given to RN, Post -op Vital signs reviewed and stable and Patient moving all extremities  Post vital signs: Reviewed and stable  Last Vitals:  Filed Vitals:   09/26/15 1200 09/26/15 1737  BP: 142/68 125/71  Pulse: 70   Temp: 37.1 C   Resp: 18 15    Last Pain: There were no vitals filed for this visit.    Patients Stated Pain Goal: 3 ([id number])  Complications: No apparent anesthesia complications

## 2015-09-26 NOTE — Progress Notes (Signed)
No apparent distress Awake Moving legs well Family updated

## 2015-09-26 NOTE — H&P (Signed)
CC:  No chief complaint on file.   HPI: Lisa Stephens is a 60 y.o. female who presents for re-position or removal of right L4-5 interbody cage due to retropulsion and foot drop.  She is about 3 months post-op from L3-5 PLIF.  PMH: Past Medical History  Diagnosis Date  . Hypertension   . Hypothyroidism   . Diabetes mellitus without complication ([place])   . Depression   . Arthritis     PSH: Past Surgical History  Procedure Laterality Date  . Abdominal hysterectomy    . Tubal ligation    . Maximum access (mas)posterior lumbar interbody fusion (plif) 2 level N/A 07/02/2015    Procedure: L3-4 L4-5 Maximum access posterior lumbar interbody fusion;  Surgeon: Kevan Ny Tasheena Wambolt, MD;  Location:  [facility];  Service: Neurosurgery;  Laterality: N/A;  L3-4 L4-5 Maximum access posterior lumbar interbody fusion    SH: Social History  Substance Use Topics  . Smoking status: Never Smoker   . Smokeless tobacco: None  . Alcohol Use: No    MEDS: Prior to Admission medications   Medication Sig Start Date End Date Taking? Authorizing Provider  amLODipine-olmesartan (AZOR) 10-40 MG tablet Take 1 tablet by mouth daily.   Yes Historical Provider, MD  aspirin EC 81 MG tablet Take 81 mg by mouth daily.   Yes Historical Provider, MD  atorvastatin (LIPITOR) 20 MG tablet Take 20 mg by mouth every Monday, Wednesday, and Friday.    Yes Historical Provider, MD  diazepam (VALIUM) 5 MG tablet Take 1 tablet (5 mg total) by mouth every 8 (eight) hours as needed (severe spasms). 07/03/15  Yes Kevan Ny Oliviya Gilkison, MD  gabapentin (NEURONTIN) 100 MG capsule Take 2 capsules (200 mg total) by mouth 3 (three) times daily. Patient taking differently: Take 300 mg by mouth 3 (three) times daily.  07/03/15  Yes Kevan Ny Bindu Docter, MD  levothyroxine (SYNTHROID, LEVOTHROID) 100 MCG tablet Take 100 mcg by mouth daily before breakfast.   Yes Historical Provider, MD  metFORMIN (GLUCOPHAGE) 500 MG tablet Take 500 mg by mouth  daily with breakfast.    Yes Historical Provider, MD  methocarbamol (ROBAXIN) 750 MG tablet Take 1 tablet (750 mg total) by mouth every 6 (six) hours as needed for muscle spasms. 07/03/15  Yes Kevan Ny Windie Marasco, MD  bisacodyl (DULCOLAX) 5 MG EC tablet Take 1 tablet (5 mg total) by mouth daily as needed for moderate constipation. 07/03/15   Kevan Ny Doreen Garretson, MD  docusate sodium (COLACE) 100 MG capsule Take 1 capsule (100 mg total) by mouth 2 (two) times daily. Patient taking differently: Take 100 mg by mouth 2 (two) times daily as needed (For constipation.).  07/03/15   Kevan Ny Lillymae Duet, MD  Omega-3 Fatty Acids (FISH OIL) 1000 MG CAPS Take 1,000 mg by mouth daily.    Historical Provider, MD  oxyCODONE (OXY IR/ROXICODONE) 5 MG immediate release tablet Take 1 tablet (5 mg total) by mouth every 3 (three) hours as needed for breakthrough pain. 07/03/15   Kevan Ny Kelleigh Skerritt, MD  oxyCODONE (OXYCONTIN) 20 mg 12 hr tablet Take 1 tablet (20 mg total) by mouth every 12 (twelve) hours. Patient taking differently: Take 20 mg by mouth every 12 (twelve) hours as needed (For pain.).  07/03/15   Kevan Ny Demia Viera, MD  oxyCODONE-acetaminophen (PERCOCET) 7.5-325 MG tablet Take 1 tablet by mouth every 4 (four) hours as needed for severe pain. 07/11/15   Kevan Ny Trula Frede, MD    ALLERGY: Allergies  Allergen Reactions  .  Voltaren [Diclofenac Sodium] Shortness Of Breath and Other (See Comments)    Wheezing, coughing, runny nose    ROS: ROS  NEUROLOGIC EXAM: Awake, alert, oriented Memory and concentration grossly intact Speech fluent, appropriate CN grossly intact Motor exam: Upper Extremities Deltoid Bicep Tricep Grip  Right 5/5 5/5 5/5 5/5  Left 5/5 5/5 5/5 5/5   Lower Extremity IP Quad PF DF EHL  Right 5/5 5/5 5/5 1/5 1/5  Left 5/5 5/5 5/5 5/5 5/5   Sensation grossly intact to LT  IMAGING: No new imaging  IMPRESSION: - 61 y.o. female with retropulsed right L4-5 interbody cage and  foot drop  PLAN: - Right L4-5 interbody cage removal vs. reposition

## 2015-09-26 NOTE — Anesthesia Procedure Notes (Signed)
Procedure Name: Intubation Date/Time: 09/26/2015 3:28 PM Performed by: [place] Pink Pre-anesthesia Checklist: Patient identified, Emergency Drugs available, Suction available, Patient being monitored and Timeout performed Patient Re-evaluated:Patient Re-evaluated prior to inductionOxygen Delivery Method: Circle system utilized Preoxygenation: Pre-oxygenation with 100% oxygen Intubation Type: IV induction Ventilation: Mask ventilation without difficulty Laryngoscope Size: Mac and 3 Grade View: Grade I Tube type: Oral Tube size: 7.0 mm Number of attempts: 1 Airway Equipment and Method: Stylet Placement Confirmation: ETT inserted through vocal cords under direct vision,  positive ETCO2 and breath sounds checked- equal and bilateral Secured at: 21 cm Tube secured with: Tape Dental Injury: Teeth and Oropharynx as per pre-operative assessment

## 2015-09-26 NOTE — Anesthesia Preprocedure Evaluation (Addendum)
Anesthesia Evaluation  Patient identified by MRN, date of birth, ID band Patient awake    Reviewed: Allergy & Precautions, NPO status , Patient's Chart, lab work & pertinent test results  Airway Mallampati: II  TM Distance: >3 FB Neck ROM: Full    Dental  (+) Dental Advisory Given   Pulmonary neg pulmonary ROS,    breath sounds clear to auscultation       Cardiovascular hypertension, Pt. on medications  Rhythm:Regular Rate:Normal     Neuro/Psych  Neuromuscular disease    GI/Hepatic negative GI ROS, Neg liver ROS,   Endo/Other  diabetes, Type 2, Oral Hypoglycemic AgentsHypothyroidism   Renal/GU negative Renal ROS     Musculoskeletal  (+) Arthritis ,   Abdominal   Peds  Hematology negative hematology ROS (+)   Anesthesia Other Findings   Reproductive/Obstetrics                            Lab Results  Component Value Date   WBC 8.1 09/20/2015   HGB 11.5* 09/20/2015   HCT 35.8* 09/20/2015   MCV 84.4 09/20/2015   PLT 313 09/20/2015   Lab Results  Component Value Date   CREATININE 0.71 09/20/2015   BUN 13 09/20/2015   NA 139 09/20/2015   K 3.7 09/20/2015   CL 106 09/20/2015   CO2 24 09/20/2015    Anesthesia Physical Anesthesia Plan  ASA: II  Anesthesia Plan: General   Post-op Pain Management:    Induction: Intravenous  Airway Management Planned: Oral ETT  Additional Equipment:   Intra-op Plan:   Post-operative Plan: Extubation in OR  Informed Consent: I have reviewed the patients History and Physical, chart, labs and discussed the procedure including the risks, benefits and alternatives for the proposed anesthesia with the patient or authorized representative who has indicated his/her understanding and acceptance.   Dental advisory given  Plan Discussed with: CRNA  Anesthesia Plan Comments:         Anesthesia Quick Evaluation

## 2015-09-26 NOTE — Op Note (Signed)
09/26/2015  5:21 PM  PATIENT:  Lisa Stephens  60 y.o. female  PRE-OPERATIVE DIAGNOSIS:  Retropulsed right L4-5 PLIF cage, right foot drop  POST-OPERATIVE DIAGNOSIS:  Same  PROCEDURE:  Removal of right L4-5 PLIF cage, insertion of morselized allograft, reposition of right L5 screw  SURGEON:  Aldean Ast, MD  ASSISTANTS: Kary Kos, MD  ANESTHESIA:   General  DRAINS: None   SPECIMEN:  None  INDICATION FOR PROCEDURE: 60 year old female s/p L3-5 PLIF with foot drop.  She has retropulsion of the right L4-5 PLIF cage.  Patient understood the risks, benefits, and alternatives and potential outcomes and wished to proceed.  PROCEDURE DETAILS: After smooth induction of general endotracheal anesthesia the patient was turned prone on an open Erwin table. The skin was wiped down with alcohol and a mixture of lidocaine and Marcaine with epinephrine was injected. The patient was prepped and draped in the usual sterile fashion. The scar was excised sharply. I used monopolar cautery to dissected the midline until I encountered the remaining L3 spinous process. I then worked to the right side and found the right L3 pedicle screw. I followed this inferiorly to expose all of the screws on the right side. I removed the set screws and then the rod. Then working from the residual lamina of L5 I identified thecal sac. I separated this from existing scar.  I palpated the medial wall of the L5 pedicle and detected a subtle breech of the right L5 screw. I removed the L5 screw. I then continued working along the lateral aspect of the dura to identify the interspace. I dissected through the scar over the cage and found the cage to be significantly retropulsed and indenting the thecal sac. I was able to free this up and remove it with a pituitary rongeur while the thecal sac was guarded with a nerve root retractor. I then irrigated vigorously. I filled the interspace defect with approximate 5 mL of morcellized  allograft product. I drilled a new hole for the L5 pedicle screw that was superior and lateral to the existing hole. When palpated with a ball-tipped probe this was found to be solid on all sides. I then placed a new screw. I then replaced the rod and fixated it with set screws. These were finally tightened. I obtained AP and lateral fluoroscopic images which showed good position of the hardware. I closed the wound in routine anatomic layers with interrupted Vicryl sutures. The skin was closed with a running subcuticular 3-0 Monocryl stratafix suture.  It was then sealed with Dermabond. A sterile dressing was placed. The patient was returned to the supine position on the stretcher.  PATIENT DISPOSITION:  PACU - hemodynamically stable.   Delay start of Pharmacological VTE agent (>24hrs) due to surgical blood loss or risk of bleeding:  yes

## 2015-09-27 LAB — BASIC METABOLIC PANEL
ANION GAP: 6 (ref 5–15)
BUN: 12 mg/dL (ref 6–20)
CHLORIDE: 104 mmol/L (ref 101–111)
CO2: 26 mmol/L (ref 22–32)
Calcium: 8.6 mg/dL — ABNORMAL LOW (ref 8.9–10.3)
Creatinine, Ser: 0.86 mg/dL (ref 0.44–1.00)
GFR calc Af Amer: >60 mL/min (ref >60)
GLUCOSE: 141 mg/dL — AB (ref 65–99)
POTASSIUM: 3.5 mmol/L (ref 3.5–5.1)
Sodium: 136 mmol/L (ref 135–145)

## 2015-09-27 LAB — CBC
HCT: 32.8 % — ABNORMAL LOW (ref 36.0–46.0)
HEMOGLOBIN: 10.3 g/dL — AB (ref 12.0–15.0)
MCH: 26.8 pg (ref 26.0–34.0)
MCHC: 31.4 g/dL (ref 30.0–36.0)
MCV: 85.2 fL (ref 78.0–100.0)
PLATELETS: 289 10*3/uL (ref 150–400)
RBC: 3.85 MIL/uL — AB (ref 3.87–5.11)
RDW: 14.5 % (ref 11.5–15.5)
WBC: 10.6 10*3/uL — AB (ref 4.0–10.5)

## 2015-09-27 LAB — GLUCOSE, CAPILLARY: GLUCOSE-CAPILLARY: 133 mg/dL — AB (ref 65–99)

## 2015-09-27 MED ORDER — METHOCARBAMOL 750 MG PO TABS: 750.0000 mg | ORAL_TABLET | Freq: Four times a day (QID) | ORAL | Status: DC

## 2015-09-27 MED ORDER — PROMETHAZINE HCL 25 MG PO TABS
12.5000 mg | ORAL_TABLET | Freq: Four times a day (QID) | ORAL | Status: DC | PRN
  Administered 2015-09-27: 12.5 mg via ORAL
  Filled 2015-09-27: qty 1

## 2015-09-27 MED ORDER — OXYCODONE-ACETAMINOPHEN 7.5-325 MG PO TABS: 1.0000 | ORAL_TABLET | ORAL | Status: DC | PRN

## 2015-09-27 MED ORDER — GABAPENTIN 300 MG PO CAPS: 300.0000 mg | ORAL_CAPSULE | Freq: Three times a day (TID) | ORAL | Status: DC

## 2015-09-27 NOTE — Evaluation (Addendum)
Occupational Therapy Evaluation and Discharge Patient Details Name: Lisa Stephens MRN: [id number] DOB: [birth year] Today's Date: 09/27/2015    History of Present Illness Pt is a 60 y.o. female s/p Removal of right L4-5 PLIF cage, insertion of morselized allograft, reposition of right L5 screw. PMHx: HTN, DM, Arithritis, Depression, L3-L5 MAS PLIF on 07/02/2015.   Clinical Impression   Pt reports she was managing ADLs independently PTA. Currently pt overall supervision for safety with ADLs and functional mobility with the exception of min assist for LB ADLs; pt reports husband can assist as needed. All back, safety, and ADL education completed with pt; she has no further questions or concerns for OT at this time. No further acute OT needs identified; signing off at this time. Please re-consult if needs change. Thank you for this referral.    Follow Up Recommendations  No OT follow up;Supervision/Assistance - 24 hour (initially)    Equipment Recommendations  None recommended by OT    Recommendations for Other Services PT consult     Precautions / Restrictions Precautions Precautions: Back;Fall Precaution Booklet Issued: Yes (comment) Precaution Comments: Educated pt on all precautions. Required Braces or Orthoses: Spinal Brace Spinal Brace: Lumbar corset;Applied in sitting position (off for bathing) Restrictions Weight Bearing Restrictions: No      Mobility Bed Mobility Overal bed mobility: Needs Assistance Bed Mobility: Rolling;Sidelying to Sit Rolling: Supervision Sidelying to sit: Supervision       General bed mobility comments: HOB flat with use of bed rails. VCs for technique throughout.  Transfers Overall transfer level: Needs assistance Equipment used: None Transfers: Sit to/from Stand Sit to Stand: Supervision         General transfer comment: Supervision for safety; no physical assist required.    Balance Overall balance assessment: Needs  assistance Sitting-balance support: Feet supported;No upper extremity supported Sitting balance-Leahy Scale: Normal     Standing balance support: No upper extremity supported;During functional activity Standing balance-Leahy Scale: Fair                              ADL Overall ADL's : Needs assistance/impaired Eating/Feeding: Independent;Sitting   Grooming: Supervision/safety;Standing Grooming Details (indicate cue type and reason): Educated pt on use of 2 cups for oral care Upper Body Bathing: Set up;Supervision/ safety;Sitting   Lower Body Bathing: Minimal assistance;Sit to/from stand   Upper Body Dressing : Set up;Supervision/safety;Sitting Upper Body Dressing Details (indicate cue type and reason): Pt able to demo donning back brace with set up Lower Body Dressing: Minimal assistance;Sit to/from stand Lower Body Dressing Details (indicate cue type and reason): Pt able to cross foot over opposite knee to pull up socks. Educated pt on compensatory strategies for LB ADLs; pt reports husband can also assist as needed. No need for AE. Toilet Transfer: Supervision/safety;Ambulation;BSC Toilet Transfer Details (indicate cue type and reason): Simulated by sit to stand from EOB and functional mobiltiy in room. Toileting- Clothing Manipulation and Hygiene: Supervision/safety;Sit to/from Nurse, children's Details (indicate cue type and reason): Pt able to verbalize use of 3 in 1 in tub and tub transfer technique; reports husband to assist as needed. Functional mobility during ADLs: Supervision/safety General ADL Comments: Educated pt on maintaining back precautions during functional activities, log roll technique for bed mobility, pillow between legs when sleeping on her side, no reaching, keeping frequently used items at counter top height.     Vision Vision Assessment?: No apparent visual deficits  Perception     Praxis      Pertinent Vitals/Pain Pain  Assessment: 0-10 Pain Score: 5  Pain Location: R hip, back Pain Descriptors / Indicators: Cramping;Aching Pain Intervention(s): Monitored during session;Repositioned     Hand Dominance     Extremity/Trunk Assessment Upper Extremity Assessment Upper Extremity Assessment: Overall WFL for tasks assessed   Lower Extremity Assessment Lower Extremity Assessment: Defer to PT evaluation   Cervical / Trunk Assessment Cervical / Trunk Assessment: Other exceptions Cervical / Trunk Exceptions: s/p lumbar sx   Communication Communication Communication: No difficulties   Cognition Arousal/Alertness: Awake/alert Behavior During Therapy: WFL for tasks assessed/performed Overall Cognitive Status: Within Functional Limits for tasks assessed       Memory: Decreased recall of precautions             General Comments       Exercises       Shoulder Instructions      Home Living Family/patient expects to be discharged to:: Private residence Living Arrangements: Spouse/significant other Available Help at Discharge: Family;Available 24 hours/day (24/7 initially, husband works during the day) Type of Home: House Home Access: Level entry     [place]: Two level Alternate Level Stairs-Number of Steps: 2 steps from den to main level   Bathroom Shower/Tub: Tub/shower unit Shower/tub characteristics: Architectural technologist: Standard Bathroom Accessibility: Yes How Accessible: Accessible via walker Home Equipment: Gilford Rile - 2 wheels;Bedside commode          Prior Functioning/Environment Level of Independence: Independent        Comments: Husband initially assisted with ADLs and pt was using a RW for mobility but recently was independent.    OT Diagnosis: Generalized weakness;Acute pain   OT Problem List:     OT Treatment/Interventions:      OT Goals(Current goals can be found in the care plan section) Acute Rehab OT Goals Patient Stated Goal: home today OT Goal  Formulation: All assessment and education complete, DC therapy  OT Frequency:     Barriers to D/C:            Co-evaluation              End of Session Equipment Utilized During Treatment: Back brace  Activity Tolerance: Patient tolerated treatment well Patient left: in chair;with call bell/phone within reach   Time: 0749-0802 OT Time Calculation (min): 13 min Charges:  OT General Charges $OT Visit: 1 Procedure OT Evaluation $OT Eval Moderate Complexity: 1 Procedure G-Codes:     Binnie Kand M.S., OTR/L Pager: [phone]  09/27/2015, 8:36 AM

## 2015-09-27 NOTE — Progress Notes (Signed)
Patient alert and oriented, mae's well, voiding adequate amount of urine, swallowing without difficulty, no c/o pain. Patient discharged home with family. Script and discharged instructions given to patient. Patient and family stated understanding of d/c instructions given and has an appointment with MD. 

## 2015-09-27 NOTE — Evaluation (Signed)
Physical Therapy Evaluation and Discharge Patient Details Name: Lisa Stephens MRN: [id number] DOB: [birth year] Today's Date: 09/27/2015   History of Present Illness  Pt is a 60 y.o. female s/p Removal of right L4-5 PLIF cage, insertion of morselized allograft, reposition of right L5 screw. PMHx: HTN, DM, Arithritis, Depression, L3-L5 MAS PLIF on 07/02/2015.  Clinical Impression  Patient evaluated by Physical Therapy with no further acute PT needs identified. All education has been completed and the patient has no further questions. At the time of PT eval pt was able to perform transfers and ambulation with supervision progressing to modified independence by end of session. Pt with recent back surgery and reports understanding of all precautions. States she feels she can manage well at home. See below for any follow-up Physical Therapy or equipment needs. PT is signing off. Thank you for this referral.     Follow Up Recommendations Outpatient PT;Supervision for mobility/OOB    Equipment Recommendations  None recommended by PT    Recommendations for Other Services       Precautions / Restrictions Precautions Precautions: Back;Fall Precaution Booklet Issued: Yes (comment) Precaution Comments: Pt was able to recall 3/3 back precautions.  Required Braces or Orthoses: Spinal Brace Spinal Brace: Lumbar corset;Applied in sitting position (off for bathing) Restrictions Weight Bearing Restrictions: No      Mobility  Bed Mobility Overal bed mobility: Needs Assistance Bed Mobility: Rolling;Sidelying to Sit Rolling: Supervision Sidelying to sit: Supervision       General bed mobility comments: Pt sitting up in recliner upon PT arrival.   Transfers Overall transfer level: Needs assistance Equipment used: None Transfers: Sit to/from Stand Sit to Stand: Supervision;Modified independent (Device/Increase time)         General transfer comment: Light supervision for safety initially  progressing to modified independence; no physical assist required.  Ambulation/Gait Ambulation/Gait assistance: Supervision;Modified independent (Device/Increase time) Ambulation Distance (Feet): 300 Feet Assistive device: None Gait Pattern/deviations: Step-through pattern;Decreased stride length;Decreased weight shift to right Gait velocity: Decreased Gait velocity interpretation: Below normal speed for age/gender General Gait Details: VC's for improved posture and even step/stride length. Pt with antalgic gait favoring RLE due to weakness. Pt progressed from supervision to modified independent by end of session.   Stairs Stairs: Yes Stairs assistance: Supervision Stair Management: One rail Right;Step to pattern;Sideways Number of Stairs: 2 General stair comments: No assist required; no unsteadiness or LOB noted.   Wheelchair Mobility    Modified Rankin (Stroke Patients Only)       Balance Overall balance assessment: Needs assistance Sitting-balance support: Feet supported;No upper extremity supported Sitting balance-Leahy Scale: Good     Standing balance support: No upper extremity supported;During functional activity Standing balance-Leahy Scale: Fair Standing balance comment: Guarded but steady with dynamic balance activity.                              Pertinent Vitals/Pain Pain Assessment: Faces Pain Score: 5  Faces Pain Scale: Hurts a little bit Pain Location: Incision site Pain Descriptors / Indicators: Operative site guarding;Sore Pain Intervention(s): Limited activity within patient's tolerance;Monitored during session;Repositioned    Home Living Family/patient expects to be discharged to:: Private residence Living Arrangements: Spouse/significant other Available Help at Discharge: Family;Available 24 hours/day (24/7 initially, husband works during the day) Type of Home: House Home Access: Level entry     [place]: [place]: Environmental consultant - 2 wheels;Bedside commode      Prior  Function Level of Independence: Independent         Comments: Husband initially assisted with ADLs and pt was using a RW for mobility but recently was independent.     Hand Dominance        Extremity/Trunk Assessment   Upper Extremity Assessment: Defer to OT evaluation           Lower Extremity Assessment: RLE deficits/detail RLE Deficits / Details: Decreased strength and AROM consistent with pre-op diagnosis.     Cervical / Trunk Assessment: Other exceptions  Communication   Communication: No difficulties  Cognition Arousal/Alertness: Awake/alert Behavior During Therapy: WFL for tasks assessed/performed Overall Cognitive Status: Within Functional Limits for tasks assessed       Memory: Decreased recall of precautions              General Comments      Exercises        Assessment/Plan    PT Assessment Patient needs continued PT services  PT Diagnosis Difficulty walking;Acute pain   PT Problem List Decreased strength;Decreased range of motion;Decreased activity tolerance;Decreased mobility;Decreased balance;Decreased knowledge of use of DME;Decreased safety awareness;Decreased knowledge of precautions;Pain  PT Treatment Interventions     PT Goals (Current goals can be found in the Care Plan section) Acute Rehab PT Goals Patient Stated Goal: home today PT Goal Formulation: All assessment and education complete, DC therapy    Frequency     Barriers to discharge        Co-evaluation               End of Session Equipment Utilized During Treatment: Back brace Activity Tolerance: Patient tolerated treatment well Patient left: in chair;with call bell/phone within reach           Time: 0807-0820 PT Time Calculation (min) (ACUTE ONLY): 13 min   Charges:   PT Evaluation $PT Eval Moderate Complexity: 1 Procedure     PT G CodesRolinda Roan 2015-10-01, 9:42 AM    Rolinda Roan, PT, DPT Acute Rehabilitation Services Pager: [phone]

## 2015-09-27 NOTE — Discharge Summary (Signed)
Date of Admission: 09/26/2015  Date of Discharge: 09/27/2015  Admission Diagnosis: Right foot drop, retropulsed right L4-5 interbody cage  Discharge Diagnosis: same  Procedure Performed: Hardware revision  Attending: Kevan Ny Porshea Janowski, MD  Hospital Course:  The patient was admitted for the above listed operation and had an uncomplicated post-operative course.  They were discharged in stable condition.  Follow up: 3 weeks    Medication List    TAKE these medications        amLODipine-olmesartan 10-40 MG tablet  Commonly known as:  AZOR  Take 1 tablet by mouth daily.     aspirin EC 81 MG tablet  Take 81 mg by mouth daily.     atorvastatin 20 MG tablet  Commonly known as:  LIPITOR  Take 20 mg by mouth every Monday, Wednesday, and Friday.     bisacodyl 5 MG EC tablet  Commonly known as:  DULCOLAX  Take 1 tablet (5 mg total) by mouth daily as needed for moderate constipation.     diazepam 5 MG tablet  Commonly known as:  VALIUM  Take 1 tablet (5 mg total) by mouth every 8 (eight) hours as needed (severe spasms).     docusate sodium 100 MG capsule  Commonly known as:  COLACE  Take 1 capsule (100 mg total) by mouth 2 (two) times daily.     Fish Oil 1000 MG Caps  Take 1,000 mg by mouth daily.     gabapentin 300 MG capsule  Commonly known as:  NEURONTIN  Take 1 capsule (300 mg total) by mouth 3 (three) times daily.     levothyroxine 100 MCG tablet  Commonly known as:  SYNTHROID, LEVOTHROID  Take 100 mcg by mouth daily before breakfast.     metFORMIN 500 MG tablet  Commonly known as:  GLUCOPHAGE  Take 500 mg by mouth daily with breakfast.     methocarbamol 750 MG tablet  Commonly known as:  ROBAXIN  Take 1 tablet (750 mg total) by mouth every 6 (six) hours as needed for muscle spasms.     methocarbamol 750 MG tablet  Commonly known as:  ROBAXIN  Take 1 tablet (750 mg total) by mouth 4 (four) times daily.     oxyCODONE 5 MG immediate release tablet   Commonly known as:  Oxy IR/ROXICODONE  Take 1 tablet (5 mg total) by mouth every 3 (three) hours as needed for breakthrough pain.     oxyCODONE-acetaminophen 7.5-325 MG tablet  Commonly known as:  PERCOCET  Take 1 tablet by mouth every 4 (four) hours as needed for severe pain.

## 2015-09-27 NOTE — Anesthesia Postprocedure Evaluation (Signed)
Anesthesia Post Note  Patient: Lisa Stephens  Procedure(s) Performed: Procedure(s) (LRB): Revision of Lumbar four-five Interbody cage (N/A)  Patient location during evaluation: PACU Anesthesia Type: General Level of consciousness: awake and alert Pain management: pain level controlled Vital Signs Assessment: post-procedure vital signs reviewed and stable Respiratory status: spontaneous breathing, nonlabored ventilation, respiratory function stable and patient connected to nasal cannula oxygen Cardiovascular status: blood pressure returned to baseline and stable Postop Assessment: no signs of nausea or vomiting Anesthetic complications: no    Last Vitals:  Filed Vitals:   09/27/15 0400 09/27/15 0720  BP: 112/60 128/70  Pulse: 65 85  Temp: 36.6 C 36.4 C  Resp: 20 18    Last Pain:  Filed Vitals:   09/27/15 1111  PainSc: 2                  Tiajuana Amass

## 2015-11-27 DIAGNOSIS — E1159 Type 2 diabetes mellitus with other circulatory complications: Secondary | ICD-10-CM | POA: Diagnosis not present

## 2015-11-27 DIAGNOSIS — E1169 Type 2 diabetes mellitus with other specified complication: Secondary | ICD-10-CM | POA: Diagnosis not present

## 2015-11-27 DIAGNOSIS — E039 Hypothyroidism, unspecified: Secondary | ICD-10-CM | POA: Diagnosis not present

## 2015-11-27 DIAGNOSIS — E119 Type 2 diabetes mellitus without complications: Secondary | ICD-10-CM | POA: Diagnosis not present

## 2015-12-03 DIAGNOSIS — Z6833 Body mass index (BMI) 33.0-33.9, adult: Secondary | ICD-10-CM | POA: Diagnosis not present

## 2015-12-03 DIAGNOSIS — E039 Hypothyroidism, unspecified: Secondary | ICD-10-CM | POA: Diagnosis not present

## 2015-12-03 DIAGNOSIS — E119 Type 2 diabetes mellitus without complications: Secondary | ICD-10-CM | POA: Diagnosis not present

## 2015-12-03 DIAGNOSIS — E669 Obesity, unspecified: Secondary | ICD-10-CM | POA: Diagnosis not present

## 2015-12-12 DIAGNOSIS — E039 Hypothyroidism, unspecified: Secondary | ICD-10-CM | POA: Diagnosis not present

## 2015-12-12 DIAGNOSIS — E785 Hyperlipidemia, unspecified: Secondary | ICD-10-CM | POA: Diagnosis not present

## 2015-12-12 DIAGNOSIS — E119 Type 2 diabetes mellitus without complications: Secondary | ICD-10-CM | POA: Diagnosis not present

## 2015-12-12 DIAGNOSIS — E559 Vitamin D deficiency, unspecified: Secondary | ICD-10-CM | POA: Diagnosis not present

## 2015-12-12 DIAGNOSIS — Z79899 Other long term (current) drug therapy: Secondary | ICD-10-CM | POA: Diagnosis not present

## 2015-12-12 LAB — HEMOGLOBIN A1C: Hemoglobin A1C: 5.9

## 2015-12-17 DIAGNOSIS — E785 Hyperlipidemia, unspecified: Secondary | ICD-10-CM | POA: Diagnosis not present

## 2015-12-17 DIAGNOSIS — E119 Type 2 diabetes mellitus without complications: Secondary | ICD-10-CM | POA: Diagnosis not present

## 2015-12-17 DIAGNOSIS — E559 Vitamin D deficiency, unspecified: Secondary | ICD-10-CM | POA: Diagnosis not present

## 2015-12-17 DIAGNOSIS — E039 Hypothyroidism, unspecified: Secondary | ICD-10-CM | POA: Diagnosis not present

## 2015-12-20 ENCOUNTER — Encounter: Payer: Self-pay | Admitting: Pediatrics

## 2015-12-20 ENCOUNTER — Ambulatory Visit (INDEPENDENT_AMBULATORY_CARE_PROVIDER_SITE_OTHER): Payer: BLUE CROSS/BLUE SHIELD | Admitting: Pediatrics

## 2015-12-20 VITALS — BP 138/78 | HR 61 | Temp 97.9°F | Ht 64.0 in | Wt 193.2 lb

## 2015-12-20 DIAGNOSIS — E039 Hypothyroidism, unspecified: Secondary | ICD-10-CM

## 2015-12-20 DIAGNOSIS — E785 Hyperlipidemia, unspecified: Secondary | ICD-10-CM

## 2015-12-20 DIAGNOSIS — I10 Essential (primary) hypertension: Secondary | ICD-10-CM

## 2015-12-20 DIAGNOSIS — I152 Hypertension secondary to endocrine disorders: Secondary | ICD-10-CM

## 2015-12-20 DIAGNOSIS — E1159 Type 2 diabetes mellitus with other circulatory complications: Secondary | ICD-10-CM

## 2015-12-20 DIAGNOSIS — E119 Type 2 diabetes mellitus without complications: Secondary | ICD-10-CM

## 2015-12-20 DIAGNOSIS — Z01419 Encounter for gynecological examination (general) (routine) without abnormal findings: Secondary | ICD-10-CM

## 2015-12-20 DIAGNOSIS — E1169 Type 2 diabetes mellitus with other specified complication: Secondary | ICD-10-CM

## 2015-12-20 DIAGNOSIS — E669 Obesity, unspecified: Secondary | ICD-10-CM

## 2015-12-20 DIAGNOSIS — M4727 Other spondylosis with radiculopathy, lumbosacral region: Secondary | ICD-10-CM

## 2015-12-20 MED ORDER — GABAPENTIN 300 MG PO CAPS: 300.0000 mg | ORAL_CAPSULE | Freq: Two times a day (BID) | ORAL | 1 refills | Status: DC

## 2015-12-20 NOTE — Progress Notes (Signed)
Subjective:   Patient ID: Lisa Stephens, female    DOB: 02/03/1956, 60 y.o.   MRN: [id number] CC: New Patient (Initial Visit) and Gynecologic Exam  HPI: Lisa Stephens is a 60 y.o. female presenting for New Patient (Initial Visit) and Gynecologic Exam  Had hysterectomy in 2008 for heavy bleeding Last pap smear 2012 Told by surgeon who did surgery that didn't need yearly pap smears   No h/o heart attacks or strokes  Had colonoscopy at [facility] about 7 yrs ago, told she didn't need repeat for 10 yrs  HLD: taking atorvastatin 3 nights a week  DM2: metformin and diet controlled Walking on treadmill regularly when she doesn't do exercise aerobic tape  HTN: taking meds every day No headaches, no chest pain, no SOB with exertion  R foot still bothers her sometimes at work since surgery for low back 6 months ago Gabapentin helps some She feels a "rubbing" on her foot that is irritating over the course of the day, not a problem with her shoes she says H/o foot drop No longer taking diazepam, oxycodone from post-op period  Fam hx: sister with breast ca dx at 70yo Maternal aunt with colon ca                                                                                               Depression screen [facility] 2/9 12/20/2015  Decreased Interest 0  Down, Depressed, Hopeless 0  PHQ - 2 Score 0     Past Medical History:  Diagnosis Date  . Arthritis   . Depression   . Diabetes mellitus without complication ([place])   . Hypertension   . Hypothyroidism    Family History  Problem Relation Age of Onset  . Diabetes Mother   . Hypertension Mother   . Diabetes Sister   . Hypertension Sister   . Diabetes Brother   . Early death Son   . Diabetes Maternal Aunt   . Diabetes Maternal Uncle    Social History   Social History  . Marital status: Single    Spouse name: N/A  . Number of children: N/A  . Years of education: N/A   Social History Main Topics  . Smoking status: Never Smoker  .  Smokeless tobacco: Never Used  . Alcohol use No  . Drug use: No  . Sexual activity: Not Asked   Other Topics Concern  . None   Social History Narrative  . None   ROS: All systems negative other than what is in HPI  Objective:    BP 138/78   Pulse 61   Temp 97.9 F (36.6 C) (Oral)   Ht 5\' 4"  (1.626 m)   Wt 193 lb 3.2 oz (87.6 kg)   BMI 33.16 kg/m   Wt Readings from Last 3 Encounters:  12/20/15 193 lb 3.2 oz (87.6 kg)  09/26/15 199 lb 8 oz (90.5 kg)  09/20/15 199 lb 8 oz (90.5 kg)    Gen: NAD, alert, cooperative with exam, NCAT EYES: EOMI, no conjunctival injection, or no icterus ENT:  TMs pearly gray b/l, OP without erythema LYMPH: no  cervical LAD Breast: nl b/l CV: NRRR, normal S1/S2, no murmur, distal pulses 2+ b/l Resp: CTABL, no wheezes, normal WOB Abd: +BS, soft, NTND. no guarding or organomegaly Ext: No edema, warm Neuro: Alert and oriented, strength equal b/l UE and LE, coordination grossly normal MSK: normal muscle bulk GU: normal ext female genitalia, no cervix visualized on pelvic exam  Assessment & Plan:  Lisa Stephens was seen today for new patient (initial visit), med f/u and gynecologic exam.  Diagnoses and all orders for this visit:  Lumbosacral spondylosis with radiculopathy -     gabapentin (NEURONTIN) 300 MG capsule; Take 1 capsule (300 mg total) by mouth 2 (two) times daily.  Hypertension associated with diabetes ([place]) BMP wnl last week Well controlled today Cont current meds  Diabetes mellitus type 2 in obese (HCC) a1c 5.9 last week Well controlled Cont diet changes, metformin  Hypothyroidism, unspecified hypothyroidism type tsh 1.89 Cont levothyroxine  Hyperlipidemia associated with type 2 diabetes mellitus (HCC) Total: 150, HDL 49, LDL 79, TG 128 Cont atorvastatin, take daily  Obesity (BMI 30-39.9) Cont lifestyle changes  Cervical ca screen No cervix, s/p hysterectomy If normal pap smear can stop screening  Follow up plan: 3  mo [name], MD [place]

## 2015-12-23 LAB — PAP IG W/ RFLX HPV ASCU: PAP SMEAR COMMENT: 0

## 2015-12-24 ENCOUNTER — Telehealth: Payer: Self-pay | Admitting: Pediatrics

## 2015-12-24 NOTE — Telephone Encounter (Signed)
Pharmacy changed

## 2015-12-31 ENCOUNTER — Telehealth: Payer: Self-pay | Admitting: Pediatrics

## 2015-12-31 NOTE — Telephone Encounter (Signed)
Pt will get one at her job

## 2016-01-15 DIAGNOSIS — Z23 Encounter for immunization: Secondary | ICD-10-CM | POA: Diagnosis not present

## 2016-02-07 ENCOUNTER — Encounter: Payer: Self-pay | Admitting: *Deleted

## 2016-03-05 DIAGNOSIS — E119 Type 2 diabetes mellitus without complications: Secondary | ICD-10-CM | POA: Diagnosis not present

## 2016-03-05 DIAGNOSIS — Z6833 Body mass index (BMI) 33.0-33.9, adult: Secondary | ICD-10-CM | POA: Diagnosis not present

## 2016-03-05 DIAGNOSIS — E039 Hypothyroidism, unspecified: Secondary | ICD-10-CM | POA: Diagnosis not present

## 2016-03-05 DIAGNOSIS — E669 Obesity, unspecified: Secondary | ICD-10-CM | POA: Diagnosis not present

## 2016-03-19 DIAGNOSIS — E785 Hyperlipidemia, unspecified: Secondary | ICD-10-CM | POA: Diagnosis not present

## 2016-04-21 DIAGNOSIS — E039 Hypothyroidism, unspecified: Secondary | ICD-10-CM | POA: Diagnosis not present

## 2016-04-21 DIAGNOSIS — Z713 Dietary counseling and surveillance: Secondary | ICD-10-CM | POA: Diagnosis not present

## 2016-04-21 DIAGNOSIS — I10 Essential (primary) hypertension: Secondary | ICD-10-CM | POA: Diagnosis not present

## 2016-04-21 DIAGNOSIS — E669 Obesity, unspecified: Secondary | ICD-10-CM | POA: Diagnosis not present

## 2016-04-21 DIAGNOSIS — E7800 Pure hypercholesterolemia, unspecified: Secondary | ICD-10-CM | POA: Diagnosis not present

## 2016-05-13 LAB — HM DIABETES EYE EXAM

## 2016-06-11 DIAGNOSIS — E039 Hypothyroidism, unspecified: Secondary | ICD-10-CM | POA: Diagnosis not present

## 2016-06-11 DIAGNOSIS — Z6833 Body mass index (BMI) 33.0-33.9, adult: Secondary | ICD-10-CM | POA: Diagnosis not present

## 2016-06-11 DIAGNOSIS — R200 Anesthesia of skin: Secondary | ICD-10-CM | POA: Diagnosis not present

## 2016-06-11 DIAGNOSIS — E785 Hyperlipidemia, unspecified: Secondary | ICD-10-CM | POA: Diagnosis not present

## 2016-06-11 DIAGNOSIS — I10 Essential (primary) hypertension: Secondary | ICD-10-CM | POA: Diagnosis not present

## 2016-06-11 DIAGNOSIS — Z008 Encounter for other general examination: Secondary | ICD-10-CM | POA: Diagnosis not present

## 2016-06-11 DIAGNOSIS — E669 Obesity, unspecified: Secondary | ICD-10-CM | POA: Diagnosis not present

## 2016-06-11 DIAGNOSIS — E119 Type 2 diabetes mellitus without complications: Secondary | ICD-10-CM | POA: Diagnosis not present

## 2016-06-11 DIAGNOSIS — E559 Vitamin D deficiency, unspecified: Secondary | ICD-10-CM | POA: Diagnosis not present

## 2016-06-16 ENCOUNTER — Ambulatory Visit: Payer: BLUE CROSS/BLUE SHIELD | Admitting: Pediatrics

## 2016-06-16 DIAGNOSIS — E559 Vitamin D deficiency, unspecified: Secondary | ICD-10-CM | POA: Diagnosis not present

## 2016-06-16 DIAGNOSIS — Z713 Dietary counseling and surveillance: Secondary | ICD-10-CM | POA: Diagnosis not present

## 2016-06-16 DIAGNOSIS — E7800 Pure hypercholesterolemia, unspecified: Secondary | ICD-10-CM | POA: Diagnosis not present

## 2016-06-16 DIAGNOSIS — E785 Hyperlipidemia, unspecified: Secondary | ICD-10-CM | POA: Diagnosis not present

## 2016-06-16 DIAGNOSIS — I10 Essential (primary) hypertension: Secondary | ICD-10-CM | POA: Diagnosis not present

## 2016-06-16 DIAGNOSIS — E119 Type 2 diabetes mellitus without complications: Secondary | ICD-10-CM | POA: Diagnosis not present

## 2016-06-16 DIAGNOSIS — E669 Obesity, unspecified: Secondary | ICD-10-CM | POA: Diagnosis not present

## 2016-06-16 DIAGNOSIS — E039 Hypothyroidism, unspecified: Secondary | ICD-10-CM | POA: Diagnosis not present

## 2016-06-18 ENCOUNTER — Ambulatory Visit (INDEPENDENT_AMBULATORY_CARE_PROVIDER_SITE_OTHER): Payer: BLUE CROSS/BLUE SHIELD | Admitting: Pediatrics

## 2016-06-18 ENCOUNTER — Encounter: Payer: Self-pay | Admitting: Pediatrics

## 2016-06-18 VITALS — BP 134/80 | HR 65 | Temp 97.2°F | Ht 64.0 in | Wt 201.4 lb

## 2016-06-18 DIAGNOSIS — E1159 Type 2 diabetes mellitus with other circulatory complications: Secondary | ICD-10-CM

## 2016-06-18 DIAGNOSIS — I10 Essential (primary) hypertension: Secondary | ICD-10-CM | POA: Diagnosis not present

## 2016-06-18 DIAGNOSIS — E785 Hyperlipidemia, unspecified: Secondary | ICD-10-CM

## 2016-06-18 DIAGNOSIS — I152 Hypertension secondary to endocrine disorders: Secondary | ICD-10-CM

## 2016-06-18 DIAGNOSIS — E1169 Type 2 diabetes mellitus with other specified complication: Secondary | ICD-10-CM

## 2016-06-18 DIAGNOSIS — E039 Hypothyroidism, unspecified: Secondary | ICD-10-CM

## 2016-06-18 DIAGNOSIS — E669 Obesity, unspecified: Secondary | ICD-10-CM

## 2016-06-18 MED ORDER — LEVOTHYROXINE SODIUM 100 MCG PO TABS: 100.0000 ug | ORAL_TABLET | Freq: Every day | ORAL | 1 refills | Status: DC

## 2016-06-18 MED ORDER — METFORMIN HCL 500 MG PO TABS: 500.0000 mg | ORAL_TABLET | Freq: Two times a day (BID) | ORAL | 1 refills | Status: DC

## 2016-06-18 MED ORDER — AMLODIPINE-OLMESARTAN 10-40 MG PO TABS: 1.0000 | ORAL_TABLET | Freq: Every day | ORAL | 1 refills | Status: DC

## 2016-06-18 NOTE — Progress Notes (Signed)
  Subjective:   Patient ID: Lisa Stephens, female    DOB: [birth year], 61 y.o.   MRN: [id number] CC: Follow-up (6 month) multiple med problems  HPI: Lisa Stephens is a 61 y.o. female presenting for Follow-up (6 month)  DM2: AM BGLs sometimes 200, she thinsk her meter not accurate, gets better numbers at home HgA1c 6.0 last week on work labs, brought with her  HTN: 120/68 at work last week No HA, no CP  HLD: on aotrvastatin 3 days a week No side effects now, was causing joint pain before  Elevated BMI: Says snacks are probably the problem Eating chips regularly Eats a lot of [place] bars and occasional little debbi snacks Interested in [employer] Daughters also trying for weight loss  Neuropathy: gabapentin helping  Relevant past medical, surgical, family and social history reviewed. Allergies and medications reviewed and updated. History  Smoking Status  . Never Smoker  Smokeless Tobacco  . Never Used   ROS: Per HPI   Objective:    BP 134/80   Pulse 65   Temp 97.2 F (36.2 C) (Oral)   Ht 5\' 4"  (1.626 m)   Wt 201 lb 6.4 oz (91.4 kg)   BMI 34.57 kg/m   Wt Readings from Last 3 Encounters:  06/18/16 201 lb 6.4 oz (91.4 kg)  12/20/15 193 lb 3.2 oz (87.6 kg)  09/26/15 199 lb 8 oz (90.5 kg)    Gen: NAD, alert, cooperative with exam, NCAT EYES: EOMI, no conjunctival injection, or no icterus ENT:  TMs pearly gray b/l, OP without erythema LYMPH: no cervical LAD CV: NRRR, normal S1/S2, no murmur, distal pulses 2+ b/l Resp: CTABL, no wheezes, normal WOB Abd: +BS, soft, NTND. no guarding or organomegaly Ext: No edema, warm Neuro: Alert and oriented   Assessment & Plan:  Lisa Stephens was seen today for follow-up multiple med problems  Diagnoses and all orders for this visit:  Diabetes mellitus type 2 in obese (HCC) A1c 5.9 On metformin Cont Making lifestyle changes, weight slightly up, continue avoiding snacks -     metFORMIN (GLUCOPHAGE) 500 MG tablet; Take 1  tablet (500 mg total) by mouth 2 (two) times daily with a meal.  Hyperlipidemia associated with type 2 diabetes mellitus (HCC) Stable, cont statin, no s/e  Hypertension associated with diabetes ([place]) Adequate control today Cont to work on weight loss, avoiding salt Cont current meds If elevated at home let me know -     amLODipine-olmesartan (AZOR) 10-40 MG tablet; Take 1 tablet by mouth daily.  Hypothyroidism, unspecified type TSH appropriate on work labs, cont levothyroxine -     levothyroxine (SYNTHROID, LEVOTHROID) 100 MCG tablet; Take 1 tablet (100 mcg total) by mouth daily before breakfast.   Follow up plan: Return in about 3 months (around 09/18/2016). [name], MD [place]

## 2016-06-30 DIAGNOSIS — E785 Hyperlipidemia, unspecified: Secondary | ICD-10-CM | POA: Diagnosis not present

## 2016-06-30 DIAGNOSIS — E119 Type 2 diabetes mellitus without complications: Secondary | ICD-10-CM | POA: Diagnosis not present

## 2016-06-30 DIAGNOSIS — E559 Vitamin D deficiency, unspecified: Secondary | ICD-10-CM | POA: Diagnosis not present

## 2016-06-30 DIAGNOSIS — E039 Hypothyroidism, unspecified: Secondary | ICD-10-CM | POA: Diagnosis not present

## 2016-07-16 DIAGNOSIS — I10 Essential (primary) hypertension: Secondary | ICD-10-CM | POA: Diagnosis not present

## 2016-07-16 DIAGNOSIS — Z6833 Body mass index (BMI) 33.0-33.9, adult: Secondary | ICD-10-CM | POA: Diagnosis not present

## 2016-07-16 DIAGNOSIS — M4317 Spondylolisthesis, lumbosacral region: Secondary | ICD-10-CM | POA: Diagnosis not present

## 2016-09-08 DIAGNOSIS — Z719 Counseling, unspecified: Secondary | ICD-10-CM | POA: Diagnosis not present

## 2016-09-08 DIAGNOSIS — E119 Type 2 diabetes mellitus without complications: Secondary | ICD-10-CM | POA: Diagnosis not present

## 2016-09-08 DIAGNOSIS — E039 Hypothyroidism, unspecified: Secondary | ICD-10-CM | POA: Diagnosis not present

## 2016-09-08 DIAGNOSIS — E785 Hyperlipidemia, unspecified: Secondary | ICD-10-CM | POA: Diagnosis not present

## 2016-09-08 DIAGNOSIS — E559 Vitamin D deficiency, unspecified: Secondary | ICD-10-CM | POA: Diagnosis not present

## 2016-09-18 ENCOUNTER — Ambulatory Visit: Payer: BLUE CROSS/BLUE SHIELD | Admitting: Pediatrics

## 2016-09-18 DIAGNOSIS — Z1231 Encounter for screening mammogram for malignant neoplasm of breast: Secondary | ICD-10-CM | POA: Diagnosis not present

## 2016-09-24 ENCOUNTER — Encounter: Payer: Self-pay | Admitting: Pediatrics

## 2016-09-24 ENCOUNTER — Ambulatory Visit (INDEPENDENT_AMBULATORY_CARE_PROVIDER_SITE_OTHER): Payer: BLUE CROSS/BLUE SHIELD | Admitting: Pediatrics

## 2016-09-24 ENCOUNTER — Ambulatory Visit (INDEPENDENT_AMBULATORY_CARE_PROVIDER_SITE_OTHER): Payer: BLUE CROSS/BLUE SHIELD

## 2016-09-24 VITALS — BP 134/77 | HR 61 | Temp 97.0°F | Ht 64.0 in | Wt 199.2 lb

## 2016-09-24 DIAGNOSIS — M25512 Pain in left shoulder: Secondary | ICD-10-CM | POA: Diagnosis not present

## 2016-09-24 DIAGNOSIS — Z114 Encounter for screening for human immunodeficiency virus [HIV]: Secondary | ICD-10-CM | POA: Diagnosis not present

## 2016-09-24 DIAGNOSIS — E1159 Type 2 diabetes mellitus with other circulatory complications: Secondary | ICD-10-CM | POA: Diagnosis not present

## 2016-09-24 DIAGNOSIS — E669 Obesity, unspecified: Secondary | ICD-10-CM

## 2016-09-24 DIAGNOSIS — I10 Essential (primary) hypertension: Secondary | ICD-10-CM | POA: Diagnosis not present

## 2016-09-24 DIAGNOSIS — Z1159 Encounter for screening for other viral diseases: Secondary | ICD-10-CM | POA: Diagnosis not present

## 2016-09-24 DIAGNOSIS — G8929 Other chronic pain: Secondary | ICD-10-CM

## 2016-09-24 DIAGNOSIS — E1169 Type 2 diabetes mellitus with other specified complication: Secondary | ICD-10-CM | POA: Diagnosis not present

## 2016-09-24 DIAGNOSIS — E039 Hypothyroidism, unspecified: Secondary | ICD-10-CM | POA: Diagnosis not present

## 2016-09-24 DIAGNOSIS — I152 Hypertension secondary to endocrine disorders: Secondary | ICD-10-CM

## 2016-09-24 LAB — BAYER DCA HB A1C WAIVED: HB A1C (BAYER DCA - WAIVED): 6.3 % (ref <7.0)

## 2016-09-24 NOTE — Progress Notes (Signed)
  Subjective:   Patient ID: Lisa Stephens, female    DOB: [birth year], 61 y.o.   MRN: [id number] CC: Follow-up (3 month); Shoulder Pain (left, 2 month); and Leg cramps  HPI: Lisa Stephens is a 61 y.o. female presenting for Follow-up (3 month); Shoulder Pain (left, 2 month); and Leg cramps  DM2: Doesn't check at home  HTN: no CP, no SOB  Neuropathy: Has not needed gabapentin for 3 months  Leg cramps: resolved since started MVI with zinc and magnesium  Shoulder pain: L shoulder Some days bother her more than others As long as she stays active not hurting When she is sitting still for a while  Relevant past medical, surgical, family and social history reviewed. Allergies and medications reviewed and updated. History  Smoking Status  . Never Smoker  Smokeless Tobacco  . Never Used   ROS: Per HPI   Objective:    BP 134/77   Pulse 61   Temp 97 F (36.1 C) (Oral)   Ht 5\' 4"  (1.626 m)   Wt 199 lb 3.2 oz (90.4 kg)   BMI 34.19 kg/m   Wt Readings from Last 3 Encounters:  09/24/16 199 lb 3.2 oz (90.4 kg)  06/18/16 201 lb 6.4 oz (91.4 kg)  12/20/15 193 lb 3.2 oz (87.6 kg)    Gen: NAD, alert, cooperative with exam, NCAT EYES: EOMI, no conjunctival injection, or no icterus LYMPH: no cervical LAD CV: NRRR, normal S1/S2 Resp: CTABL, no wheezes, normal WOB Ext: No edema, warm Neuro: Alert and oriented, strength equal b/l UE and LE, coordination grossly normal MSK: normal in appearance, symmetric, no ttp, normal ROM, rotator cuff muscles intact  Assessment & Plan:  Lisa Stephens was seen today for follow-up, shoulder pain.  Diagnoses and all orders for this visit:  Diabetes mellitus type 2 in obese ([place]) A1c well controlled last visit, repeat today Cont metformin -     Bayer DCA Hb A1c Waived  Hypertension associated with diabetes ([place]) stable, cont current medicines  Hypothyroidism, unspecified type [place], cont current meds  Chronic left shoulder pain Started about a month  ago eval for arthritis, consider PT if neg and ongoing symptoms Trial of topical aspercreme -     DG Shoulder Left; Future  Need for hepatitis C screening test -     Hepatitis C antibody  Screening for HIV without presence of risk factors -     HIV antibody   Follow up plan: Return in about 3 months (around 12/25/2016) for chronic f/u 3 mo. [name], MD [place]

## 2016-09-25 LAB — HEPATITIS C ANTIBODY

## 2016-09-25 LAB — HIV ANTIBODY (ROUTINE TESTING W REFLEX): HIV SCREEN 4TH GENERATION: NONREACTIVE

## 2016-10-22 DIAGNOSIS — M4317 Spondylolisthesis, lumbosacral region: Secondary | ICD-10-CM | POA: Diagnosis not present

## 2016-10-27 DIAGNOSIS — I10 Essential (primary) hypertension: Secondary | ICD-10-CM | POA: Diagnosis not present

## 2016-10-27 DIAGNOSIS — E7800 Pure hypercholesterolemia, unspecified: Secondary | ICD-10-CM | POA: Diagnosis not present

## 2016-10-27 DIAGNOSIS — E669 Obesity, unspecified: Secondary | ICD-10-CM | POA: Diagnosis not present

## 2016-10-27 DIAGNOSIS — Z713 Dietary counseling and surveillance: Secondary | ICD-10-CM | POA: Diagnosis not present

## 2016-11-10 ENCOUNTER — Other Ambulatory Visit: Payer: Self-pay | Admitting: Pediatrics

## 2016-11-10 DIAGNOSIS — I152 Hypertension secondary to endocrine disorders: Secondary | ICD-10-CM

## 2016-11-10 DIAGNOSIS — I10 Essential (primary) hypertension: Principal | ICD-10-CM

## 2016-11-10 DIAGNOSIS — E1159 Type 2 diabetes mellitus with other circulatory complications: Secondary | ICD-10-CM

## 2016-12-10 DIAGNOSIS — Z719 Counseling, unspecified: Secondary | ICD-10-CM | POA: Diagnosis not present

## 2016-12-10 DIAGNOSIS — Z6833 Body mass index (BMI) 33.0-33.9, adult: Secondary | ICD-10-CM | POA: Diagnosis not present

## 2016-12-10 DIAGNOSIS — E119 Type 2 diabetes mellitus without complications: Secondary | ICD-10-CM | POA: Diagnosis not present

## 2016-12-10 DIAGNOSIS — Z79899 Other long term (current) drug therapy: Secondary | ICD-10-CM | POA: Diagnosis not present

## 2016-12-10 DIAGNOSIS — Z008 Encounter for other general examination: Secondary | ICD-10-CM | POA: Diagnosis not present

## 2016-12-10 DIAGNOSIS — E559 Vitamin D deficiency, unspecified: Secondary | ICD-10-CM | POA: Diagnosis not present

## 2016-12-10 DIAGNOSIS — E039 Hypothyroidism, unspecified: Secondary | ICD-10-CM | POA: Diagnosis not present

## 2016-12-18 ENCOUNTER — Other Ambulatory Visit: Payer: Self-pay | Admitting: Pediatrics

## 2016-12-18 DIAGNOSIS — E039 Hypothyroidism, unspecified: Secondary | ICD-10-CM

## 2016-12-24 DIAGNOSIS — E119 Type 2 diabetes mellitus without complications: Secondary | ICD-10-CM | POA: Diagnosis not present

## 2016-12-24 DIAGNOSIS — E785 Hyperlipidemia, unspecified: Secondary | ICD-10-CM | POA: Diagnosis not present

## 2016-12-24 DIAGNOSIS — E559 Vitamin D deficiency, unspecified: Secondary | ICD-10-CM | POA: Diagnosis not present

## 2016-12-24 DIAGNOSIS — E039 Hypothyroidism, unspecified: Secondary | ICD-10-CM | POA: Diagnosis not present

## 2016-12-26 ENCOUNTER — Encounter: Payer: Self-pay | Admitting: *Deleted

## 2016-12-26 ENCOUNTER — Ambulatory Visit (INDEPENDENT_AMBULATORY_CARE_PROVIDER_SITE_OTHER): Payer: BLUE CROSS/BLUE SHIELD | Admitting: Pediatrics

## 2016-12-26 ENCOUNTER — Encounter: Payer: Self-pay | Admitting: Pediatrics

## 2016-12-26 VITALS — BP 127/64 | HR 60 | Temp 97.8°F | Ht 64.0 in | Wt 198.0 lb

## 2016-12-26 DIAGNOSIS — I152 Hypertension secondary to endocrine disorders: Secondary | ICD-10-CM

## 2016-12-26 DIAGNOSIS — Z23 Encounter for immunization: Secondary | ICD-10-CM | POA: Diagnosis not present

## 2016-12-26 DIAGNOSIS — E1169 Type 2 diabetes mellitus with other specified complication: Secondary | ICD-10-CM

## 2016-12-26 DIAGNOSIS — E1159 Type 2 diabetes mellitus with other circulatory complications: Secondary | ICD-10-CM | POA: Diagnosis not present

## 2016-12-26 DIAGNOSIS — I10 Essential (primary) hypertension: Secondary | ICD-10-CM | POA: Diagnosis not present

## 2016-12-26 DIAGNOSIS — E785 Hyperlipidemia, unspecified: Secondary | ICD-10-CM

## 2016-12-26 DIAGNOSIS — E669 Obesity, unspecified: Secondary | ICD-10-CM

## 2016-12-26 DIAGNOSIS — E039 Hypothyroidism, unspecified: Secondary | ICD-10-CM

## 2016-12-26 MED ORDER — ATORVASTATIN CALCIUM 20 MG PO TABS
20.0000 mg | ORAL_TABLET | Freq: Every day | ORAL | 1 refills | Status: DC
Start: 2016-12-26 — End: 2017-06-26

## 2016-12-26 MED ORDER — AMLODIPINE-OLMESARTAN 10-40 MG PO TABS: 1.0000 | ORAL_TABLET | Freq: Every day | ORAL | 1 refills | Status: DC

## 2016-12-26 MED ORDER — LEVOTHYROXINE SODIUM 100 MCG PO TABS: ORAL_TABLET | ORAL | 1 refills | Status: DC

## 2016-12-26 MED ORDER — METFORMIN HCL 500 MG PO TABS
500.0000 mg | ORAL_TABLET | Freq: Two times a day (BID) | ORAL | 1 refills | Status: DC
Start: 2016-12-26 — End: 2017-05-30

## 2016-12-26 NOTE — Progress Notes (Signed)
  Subjective:   Patient ID: Lisa Stephens, female    DOB: 05/05/1955, 61 y.o.   MRN: [id number] CC: Follow-up (3 month)  HPI: Lisa Stephens is a 61 y.o. female presenting for Follow-up (3 month)  DM2: recent A1c at work 6.3 Says when she checks in the morning sometimes 170 Avoiding sugar, sodas  HTN: No CP, SOB or fevers  Stress at home: brother in hospice for pancreatic cancer Has been stressful caring for him, work too [place] she is feeling better past week, more at peace  Hypothyroidism: taking medicine every day  Usually sleeps ok  Mood has been ok other than above  Neuropathy: was on gabapentin Stopped it bc she doesn't like taking medicines Does say it was helpful, rarely has symptoms but says she can handle  Relevant past medical, surgical, family and social history reviewed. Allergies and medications reviewed and updated. History  Smoking Status  . Never Smoker  Smokeless Tobacco  . Never Used   ROS: Per HPI   Objective:    BP 127/64   Pulse 60   Temp 97.8 F (36.6 C) (Oral)   Ht 5\' 4"  (1.626 m)   Wt 198 lb (89.8 kg)   BMI 33.99 kg/m   Wt Readings from Last 3 Encounters:  12/26/16 198 lb (89.8 kg)  09/24/16 199 lb 3.2 oz (90.4 kg)  06/18/16 201 lb 6.4 oz (91.4 kg)    Gen: NAD, alert, cooperative with exam, NCAT EYES: EOMI, no conjunctival injection, or no icterus ENT:  TMs pearly gray b/l, OP without erythema LYMPH: no cervical LAD CV: NRRR, normal S1/S2, no murmur, distal pulses 2+ b/l Resp: CTABL, no wheezes, normal WOB Abd: +BS, soft, NTND. Ext: No edema, warm Neuro: Alert and oriented MSK: normal muscle bulk  Assessment & Plan:  Lisa Stephens was seen today for follow-up multiple med problems  Diagnoses and all orders for this visit:  Diabetes mellitus type 2 in obese ([place]) A1c 6.3 recently -     Bayer DCA Hb A1c Waived -     metFORMIN (GLUCOPHAGE) 500 MG tablet; Take 1 tablet (500 mg total) by mouth 2 (two) times daily with a meal.  Hypertension  associated with diabetes (HCC) Stable, cont current meds -     amLODipine-olmesartan (AZOR) 10-40 MG tablet; Take 1 tablet by mouth daily.  Hypothyroidism, unspecified type TSH recent 2.03 -     levothyroxine (SYNTHROID, LEVOTHROID) 100 MCG tablet; TAKE 1 TABLET BY MOUTH  DAILY BEFORE BREAKFAST  Hyperlipidemia associated with type 2 diabetes mellitus (HCC) Total chol219, LDL 144, TG 117 -     atorvastatin (LIPITOR) 20 MG tablet; Take 1 tablet (20 mg total) by mouth daily at 6 PM.   Follow up plan: Return in about 3 months (around 03/27/2017). [name], MD [place]

## 2017-02-12 ENCOUNTER — Ambulatory Visit: Payer: BLUE CROSS/BLUE SHIELD | Admitting: Pediatrics

## 2017-02-12 ENCOUNTER — Encounter: Payer: Self-pay | Admitting: Pediatrics

## 2017-02-12 VITALS — BP 139/68 | HR 65 | Temp 97.8°F | Ht 64.0 in | Wt 209.8 lb

## 2017-02-12 DIAGNOSIS — H8110 Benign paroxysmal vertigo, unspecified ear: Secondary | ICD-10-CM | POA: Diagnosis not present

## 2017-02-12 MED ORDER — MECLIZINE HCL 12.5 MG PO TABS: 12.5000 mg | ORAL_TABLET | Freq: Three times a day (TID) | ORAL | 0 refills | Status: DC | PRN

## 2017-02-12 NOTE — Patient Instructions (Addendum)

## 2017-02-12 NOTE — Progress Notes (Signed)
  Subjective:   Patient ID: Lisa Stephens, female    DOB: 1956/02/03, 61 y.o.   MRN: [id number] CC: Dizziness  HPI: Lisa Stephens is a 61 y.o. female presenting for Dizziness  When she woke up this morning, felt like her head was spinning Got worse when she leaned over and stood back up Reaching up to get phone made symptoms worse too  Never had this happen before Now feels a little lightheaded, no spinning of room Symptoms mostly resolved other than above Normal gait now No falls No headache No sensation changes or weakness in arms or legs No vision changes Some URI symptoms last couple of days  Relevant past medical, surgical, family and social history reviewed. Allergies and medications reviewed and updated. Social History   Tobacco Use  Smoking Status Never Smoker  Smokeless Tobacco Never Used   ROS: Per HPI   Objective:    BP 139/68   Pulse 65   Temp 97.8 F (36.6 C) (Oral)   Ht 5\' 4"  (1.626 m)   Wt 209 lb 12.8 oz (95.2 kg)   BMI 36.01 kg/m   Wt Readings from Last 3 Encounters:  02/12/17 209 lb 12.8 oz (95.2 kg)  12/26/16 198 lb (89.8 kg)  09/24/16 199 lb 3.2 oz (90.4 kg)    Gen: NAD, alert, cooperative with exam, NCAT EYES: EOMI, a couple beats of nystagmus b/lno conjunctival injection, or no icterus ENT:  TMs obscured with cerumen b/l, OP without erythema LYMPH: no cervical LAD CV: NRRR, normal S1/S2, no murmur, distal pulses 2+ b/l Resp: CTABL, no wheezes, normal WOB Abd: +BS, soft, NTND. no guarding or organomegaly Ext: No edema, warm Neuro: Alert and oriented, strength equal b/l UE and LE, sensation intact throughout, CN III-XII intact, rapid alternating movements intact, normal gait MSK: normal muscle bulk  Assessment & Plan:  Lisa Stephens was seen today for dizziness.  Diagnoses and all orders for this visit:  Benign paroxysmal positional vertigo, unspecified laterality Improving symptoms Gave epley manuver instructions Take below prn If symptoms return  let me know -     meclizine (ANTIVERT) 12.5 MG tablet; Take 1 tablet (12.5 mg total) 3 (three) times daily as needed by mouth for dizziness.   Follow up plan: Return if symptoms worsen or fail to improve. [name], MD [place]

## 2017-02-16 DIAGNOSIS — Z713 Dietary counseling and surveillance: Secondary | ICD-10-CM | POA: Diagnosis not present

## 2017-02-16 DIAGNOSIS — I10 Essential (primary) hypertension: Secondary | ICD-10-CM | POA: Diagnosis not present

## 2017-02-16 DIAGNOSIS — E669 Obesity, unspecified: Secondary | ICD-10-CM | POA: Diagnosis not present

## 2017-02-16 DIAGNOSIS — E7800 Pure hypercholesterolemia, unspecified: Secondary | ICD-10-CM | POA: Diagnosis not present

## 2017-03-04 DIAGNOSIS — E559 Vitamin D deficiency, unspecified: Secondary | ICD-10-CM | POA: Diagnosis not present

## 2017-03-04 DIAGNOSIS — E039 Hypothyroidism, unspecified: Secondary | ICD-10-CM | POA: Diagnosis not present

## 2017-03-04 DIAGNOSIS — E669 Obesity, unspecified: Secondary | ICD-10-CM | POA: Diagnosis not present

## 2017-03-04 DIAGNOSIS — Z6835 Body mass index (BMI) 35.0-35.9, adult: Secondary | ICD-10-CM | POA: Diagnosis not present

## 2017-03-04 DIAGNOSIS — Z719 Counseling, unspecified: Secondary | ICD-10-CM | POA: Diagnosis not present

## 2017-03-04 DIAGNOSIS — E785 Hyperlipidemia, unspecified: Secondary | ICD-10-CM | POA: Diagnosis not present

## 2017-03-04 DIAGNOSIS — Z008 Encounter for other general examination: Secondary | ICD-10-CM | POA: Diagnosis not present

## 2017-03-17 ENCOUNTER — Other Ambulatory Visit: Payer: Self-pay | Admitting: Pediatrics

## 2017-03-17 DIAGNOSIS — E1169 Type 2 diabetes mellitus with other specified complication: Secondary | ICD-10-CM

## 2017-03-17 DIAGNOSIS — E669 Obesity, unspecified: Principal | ICD-10-CM

## 2017-03-26 ENCOUNTER — Encounter: Payer: Self-pay | Admitting: Pediatrics

## 2017-03-26 ENCOUNTER — Ambulatory Visit: Payer: BLUE CROSS/BLUE SHIELD | Admitting: Pediatrics

## 2017-03-26 VITALS — BP 127/71 | HR 60 | Temp 98.7°F | Ht 64.0 in | Wt 206.6 lb

## 2017-03-26 DIAGNOSIS — I10 Essential (primary) hypertension: Secondary | ICD-10-CM | POA: Diagnosis not present

## 2017-03-26 DIAGNOSIS — E039 Hypothyroidism, unspecified: Secondary | ICD-10-CM | POA: Diagnosis not present

## 2017-03-26 DIAGNOSIS — E1169 Type 2 diabetes mellitus with other specified complication: Secondary | ICD-10-CM | POA: Diagnosis not present

## 2017-03-26 DIAGNOSIS — E669 Obesity, unspecified: Secondary | ICD-10-CM

## 2017-03-26 LAB — BAYER DCA HB A1C WAIVED: HB A1C (BAYER DCA - WAIVED): 7 % — ABNORMAL HIGH (ref <7.0)

## 2017-03-26 NOTE — Progress Notes (Signed)
  Subjective:   Patient ID: Lisa Stephens, female    DOB: Apr 22, 1955, 61 y.o.   MRN: [id number] CC: Follow-up (3 month) Multiple medical problems HPI: Lisa Stephens is a 61 y.o. female presenting for Follow-up (3 month)  DM2: eating more sweets with the holidays Taking metformin regularly  Elevated BMI: walking treadmill some off and on Planning to walk more, wants to lose 20 lbs by May for her granddaughters graduation  Hyperlipidemia: No side effects from statin, taking med regularly  Hypertension: Does not check at home, no headaches, no chest pain Taking meds  Hypothyroidism: Taking meds regularly  Vertigo is resolved since last visit  Relevant past medical, surgical, family and social history reviewed. Allergies and medications reviewed and updated. Social History   Tobacco Use  Smoking Status Never Smoker  Smokeless Tobacco Never Used   ROS: Per HPI   Objective:    BP 127/71   Pulse 60   Temp 98.7 F (37.1 C) (Oral)   Ht 5\' 4"  (1.626 m)   Wt 206 lb 9.6 oz (93.7 kg)   BMI 35.46 kg/m   Wt Readings from Last 3 Encounters:  03/26/17 206 lb 9.6 oz (93.7 kg)  02/12/17 209 lb 12.8 oz (95.2 kg)  12/26/16 198 lb (89.8 kg)    Gen: NAD, alert, cooperative with exam, NCAT EYES: EOMI, no conjunctival injection, or no icterus ENT:  TMs pearly gray b/l, OP without erythema LYMPH: no cervical LAD CV: NRRR, normal S1/S2, no murmur, distal pulses 2+ b/l Resp: CTABL, no wheezes, normal WOB Abd: +BS, soft, NTND. no guarding or organomegaly Ext: No edema, warm Neuro: Alert and oriented, strength equal b/l UE and LE, coordination grossly normal MSK: normal muscle bulk  Assessment & Plan:  Lisa Stephens was seen today for follow-up multiple medical problems  Diagnoses and all orders for this visit:  Diabetes mellitus type 2 in obese ([place]) A1c up to 7.0 Patient wants to change how she is eating, increase her physical activity before adding more medicines Will check blood sugars at  home Follow-up in 3 months Goal 5-10 pound weight loss over the next couple months -     Bayer DCA Hb A1c Waived  Hypothyroidism, unspecified type Stable, continue current medicines  Essential hypertension Adequate control, continue current medicine  Elevated BMI Change lifestyles as above  Follow up plan: Return in about 3 months (around 06/24/2017). [name], MD [place]

## 2017-04-03 ENCOUNTER — Ambulatory Visit: Payer: BLUE CROSS/BLUE SHIELD | Admitting: Pediatrics

## 2017-04-16 ENCOUNTER — Ambulatory Visit: Payer: BLUE CROSS/BLUE SHIELD | Admitting: Family

## 2017-04-16 ENCOUNTER — Encounter: Payer: Self-pay | Admitting: Family

## 2017-04-16 VITALS — BP 139/64 | HR 57 | Temp 96.7°F | Ht 64.0 in | Wt 205.0 lb

## 2017-04-16 DIAGNOSIS — R42 Dizziness and giddiness: Secondary | ICD-10-CM | POA: Diagnosis not present

## 2017-04-16 DIAGNOSIS — H8110 Benign paroxysmal vertigo, unspecified ear: Secondary | ICD-10-CM

## 2017-04-16 MED ORDER — MECLIZINE HCL 25 MG PO TABS: 25.0000 mg | ORAL_TABLET | Freq: Three times a day (TID) | ORAL | 0 refills | Status: DC | PRN

## 2017-04-16 NOTE — Progress Notes (Signed)
   Subjective:    Patient ID: Lisa Stephens, female    DOB: 11-15-1955, 62 y.o.   MRN: [id number]  Dizziness  This is a recurrent problem. The current episode started today. The problem occurs constantly. The problem has been unchanged. Associated symptoms include headaches, nausea and vertigo. Pertinent negatives include no change in bowel habit, chills, congestion, coughing, fever, joint swelling, myalgias, sore throat, urinary symptoms, vomiting or weakness. The symptoms are aggravated by bending. She has tried rest for the symptoms. The treatment provided mild relief.      Review of Systems  Constitutional: Negative for chills and fever.  HENT: Negative for congestion and sore throat.   Respiratory: Negative for cough.   Gastrointestinal: Positive for nausea. Negative for change in bowel habit and vomiting.  Musculoskeletal: Negative for joint swelling and myalgias.  Neurological: Positive for dizziness, vertigo and headaches. Negative for weakness.  All other systems reviewed and are negative.      Objective:   Physical Exam  Constitutional: She is oriented to person, place, and time. She appears well-developed and well-nourished. No distress.  HENT:  Head: Normocephalic and atraumatic.  Right Ear: External ear normal.  Mouth/Throat: Oropharynx is clear and moist.  Eyes: Pupils are equal, round, and reactive to light.  Neck: Normal range of motion. Neck supple. No thyromegaly present.  Cardiovascular: Normal rate, regular rhythm, normal heart sounds and intact distal pulses.  No murmur heard. Pulmonary/Chest: Effort normal and breath sounds normal. No respiratory distress. She has no wheezes.  Abdominal: Soft. Bowel sounds are normal. She exhibits no distension. There is no tenderness.  Musculoskeletal: Normal range of motion. She exhibits no edema or tenderness.  Neurological: She is alert and oriented to person, place, and time.  Skin: Skin is warm and dry.  Psychiatric: She  has a normal mood and affect. Her behavior is normal. Judgment and thought content normal.  Vitals reviewed.   BP 139/64   Pulse (!) 57   Temp (!) 96.7 F (35.9 C) (Oral)   Ht 5\' 4"  (1.626 m)   Wt 205 lb (93 kg)   BMI 35.19 kg/m       Assessment & Plan:  1. Benign paroxysmal positional vertigo, unspecified laterality Fall prevention discussed Will write note out of work for today Labs pending  Epley exercises discussed- Handout given  - meclizine (ANTIVERT) 25 MG tablet; Take 1 tablet (25 mg total) by mouth 3 (three) times daily as needed for dizziness.  Dispense: 30 tablet; Refill: 0 - CBC with Differential/Platelet  2. Dizziness - meclizine (ANTIVERT) 25 MG tablet; Take 1 tablet (25 mg total) by mouth 3 (three) times daily as needed for dizziness.  Dispense: 30 tablet; Refill: 0 - CBC with Differential/Platelet  Evelina Dun, FNP

## 2017-04-16 NOTE — Patient Instructions (Signed)
Vertigo °Vertigo is the feeling that you or your surroundings are moving when they are not. Vertigo can be dangerous if it occurs while you are doing something that could endanger you or others, such as driving. °What are the causes? °This condition is caused by a disturbance in the signals that are sent by your body’s sensory systems to your brain. Different causes of a disturbance can lead to vertigo, including: °· Infections, especially in the inner ear. °· A bad reaction to a drug, or misuse of alcohol and medicines. °· Withdrawal from drugs or alcohol. °· Quickly changing positions, as when lying down or rolling over in bed. °· Migraine headaches. °· Decreased blood flow to the brain. °· Decreased blood pressure. °· Increased pressure in the brain from a head or neck injury, stroke, infection, tumor, or bleeding. °· Central nervous system disorders. ° °What are the signs or symptoms? °Symptoms of this condition usually occur when you move your head or your eyes in different directions. Symptoms may start suddenly, and they usually last for less than a minute. Symptoms may include: °· Loss of balance and falling. °· Feeling like you are spinning or moving. °· Feeling like your surroundings are spinning or moving. °· Nausea and vomiting. °· Blurred vision or double vision. °· Difficulty hearing. °· Slurred speech. °· Dizziness. °· Involuntary eye movement (nystagmus). ° °Symptoms can be mild and cause only slight annoyance, or they can be severe and interfere with daily life. Episodes of vertigo may return (recur) over time, and they are often triggered by certain movements. Symptoms may improve over time. °How is this diagnosed? °This condition may be diagnosed based on medical history and the quality of your nystagmus. Your health care provider may test your eye movements by asking you to quickly change positions to trigger the nystagmus. This may be called the Dix-Hallpike test, head thrust test, or roll test.  You may be referred to a health care provider who specializes in ear, nose, and throat (ENT) problems (otolaryngologist) or a provider who specializes in disorders of the central nervous system (neurologist). °You may have additional testing, including: °· A physical exam. °· Blood tests. °· MRI. °· A CT scan. °· An electrocardiogram (ECG). This records electrical activity in your heart. °· An electroencephalogram (EEG). This records electrical activity in your brain. °· Hearing tests. ° °How is this treated? °Treatment for this condition depends on the cause and the severity of the symptoms. Treatment options include: °· Medicines to treat nausea or vertigo. These are usually used for severe cases. Some medicines that are used to treat other conditions may also reduce or eliminate vertigo symptoms. These include: °? Medicines that control allergies (antihistamines). °? Medicines that control seizures (anticonvulsants). °? Medicines that relieve depression (antidepressants). °? Medicines that relieve anxiety (sedatives). °· Head movements to adjust your inner ear back to normal. If your vertigo is caused by an ear problem, your health care provider may recommend certain movements to correct the problem. °· Surgery. This is rare. ° °Follow these instructions at home: °Safety °· Move slowly.Avoid sudden body or head movements. °· Avoid driving. °· Avoid operating heavy machinery. °· Avoid doing any tasks that would cause danger to you or others if you would have a vertigo episode during the task. °· If you have trouble walking or keeping your balance, try using a cane for stability. If you feel dizzy or unstable, sit down right away. °· Return to your normal activities as told by your   health care provider. Ask your health care provider what activities are safe for you. °General instructions °· Take over-the-counter and prescription medicines only as told by your health care provider. °· Avoid certain positions or  movements as told by your health care provider. °· Drink enough fluid to keep your urine clear or pale yellow. °· Keep all follow-up visits as told by your health care provider. This is important. °Contact a health care provider if: °· Your medicines do not relieve your vertigo or they make it worse. °· You have a fever. °· Your condition gets worse or you develop new symptoms. °· Your family or friends notice any behavioral changes. °· Your nausea or vomiting gets worse. °· You have numbness or a “pins and needles” sensation in part of your body. °Get help right away if: °· You have difficulty moving or speaking. °· You are always dizzy. °· You faint. °· You develop severe headaches. °· You have weakness in your hands, arms, or legs. °· You have changes in your hearing or vision. °· You develop a stiff neck. °· You develop sensitivity to light. °This information is not intended to replace advice given to you by your health care provider. Make sure you discuss any questions you have with your health care provider. °Document Released: 01/01/2005 Document Revised: 09/05/2015 Document Reviewed: 07/17/2014 °Elsevier Interactive Patient Education © 2018 [employer] ° °

## 2017-04-17 LAB — CBC WITH DIFFERENTIAL/PLATELET
BASOS: 0 %
Basophils Absolute: 0 10*3/uL (ref 0.0–0.2)
EOS (ABSOLUTE): 0.3 10*3/uL (ref 0.0–0.4)
Eos: 4 %
HEMATOCRIT: 36.8 % (ref 34.0–46.6)
Hemoglobin: 12.2 g/dL (ref 11.1–15.9)
IMMATURE GRANULOCYTES: 0 %
Immature Grans (Abs): 0 10*3/uL (ref 0.0–0.1)
LYMPHS ABS: 2.4 10*3/uL (ref 0.7–3.1)
Lymphs: 32 %
MCH: 27.5 pg (ref 26.6–33.0)
MCHC: 33.2 g/dL (ref 31.5–35.7)
MCV: 83 fL (ref 79–97)
Monocytes Absolute: 0.6 10*3/uL (ref 0.1–0.9)
Monocytes: 8 %
NEUTROS PCT: 56 %
Neutrophils Absolute: 4.2 10*3/uL (ref 1.4–7.0)
PLATELETS: 341 10*3/uL (ref 150–379)
RBC: 4.43 x10E6/uL (ref 3.77–5.28)
RDW: 14.4 % (ref 12.3–15.4)
WBC: 7.6 10*3/uL (ref 3.4–10.8)

## 2017-05-04 DIAGNOSIS — E7800 Pure hypercholesterolemia, unspecified: Secondary | ICD-10-CM | POA: Diagnosis not present

## 2017-05-04 DIAGNOSIS — E669 Obesity, unspecified: Secondary | ICD-10-CM | POA: Diagnosis not present

## 2017-05-04 DIAGNOSIS — Z713 Dietary counseling and surveillance: Secondary | ICD-10-CM | POA: Diagnosis not present

## 2017-05-04 DIAGNOSIS — I10 Essential (primary) hypertension: Secondary | ICD-10-CM | POA: Diagnosis not present

## 2017-05-07 ENCOUNTER — Other Ambulatory Visit: Payer: Self-pay | Admitting: Pediatrics

## 2017-05-07 DIAGNOSIS — E1159 Type 2 diabetes mellitus with other circulatory complications: Secondary | ICD-10-CM

## 2017-05-07 DIAGNOSIS — I152 Hypertension secondary to endocrine disorders: Secondary | ICD-10-CM

## 2017-05-07 DIAGNOSIS — I10 Essential (primary) hypertension: Principal | ICD-10-CM

## 2017-05-20 LAB — HM DIABETES EYE EXAM

## 2017-05-30 ENCOUNTER — Other Ambulatory Visit: Payer: Self-pay | Admitting: Pediatrics

## 2017-05-30 DIAGNOSIS — E669 Obesity, unspecified: Secondary | ICD-10-CM

## 2017-05-30 DIAGNOSIS — E1169 Type 2 diabetes mellitus with other specified complication: Secondary | ICD-10-CM

## 2017-05-30 DIAGNOSIS — E039 Hypothyroidism, unspecified: Secondary | ICD-10-CM

## 2017-06-01 ENCOUNTER — Encounter: Payer: Self-pay | Admitting: Family

## 2017-06-01 NOTE — Telephone Encounter (Signed)
Next OV 06/26/17

## 2017-06-15 DIAGNOSIS — E119 Type 2 diabetes mellitus without complications: Secondary | ICD-10-CM | POA: Diagnosis not present

## 2017-06-15 DIAGNOSIS — E039 Hypothyroidism, unspecified: Secondary | ICD-10-CM | POA: Diagnosis not present

## 2017-06-15 DIAGNOSIS — E785 Hyperlipidemia, unspecified: Secondary | ICD-10-CM | POA: Diagnosis not present

## 2017-06-15 DIAGNOSIS — E559 Vitamin D deficiency, unspecified: Secondary | ICD-10-CM | POA: Diagnosis not present

## 2017-06-22 DIAGNOSIS — E669 Obesity, unspecified: Secondary | ICD-10-CM | POA: Diagnosis not present

## 2017-06-22 DIAGNOSIS — E7800 Pure hypercholesterolemia, unspecified: Secondary | ICD-10-CM | POA: Diagnosis not present

## 2017-06-22 DIAGNOSIS — I10 Essential (primary) hypertension: Secondary | ICD-10-CM | POA: Diagnosis not present

## 2017-06-22 DIAGNOSIS — Z713 Dietary counseling and surveillance: Secondary | ICD-10-CM | POA: Diagnosis not present

## 2017-06-26 ENCOUNTER — Encounter: Payer: Self-pay | Admitting: Pediatrics

## 2017-06-26 ENCOUNTER — Ambulatory Visit: Payer: BLUE CROSS/BLUE SHIELD | Admitting: Pediatrics

## 2017-06-26 VITALS — BP 121/68 | HR 51 | Temp 97.6°F | Ht 64.0 in | Wt 196.0 lb

## 2017-06-26 DIAGNOSIS — E785 Hyperlipidemia, unspecified: Secondary | ICD-10-CM

## 2017-06-26 DIAGNOSIS — E669 Obesity, unspecified: Secondary | ICD-10-CM | POA: Diagnosis not present

## 2017-06-26 DIAGNOSIS — E1169 Type 2 diabetes mellitus with other specified complication: Secondary | ICD-10-CM

## 2017-06-26 DIAGNOSIS — I10 Essential (primary) hypertension: Secondary | ICD-10-CM

## 2017-06-26 DIAGNOSIS — E1159 Type 2 diabetes mellitus with other circulatory complications: Secondary | ICD-10-CM | POA: Diagnosis not present

## 2017-06-26 DIAGNOSIS — E039 Hypothyroidism, unspecified: Secondary | ICD-10-CM

## 2017-06-26 DIAGNOSIS — I152 Hypertension secondary to endocrine disorders: Secondary | ICD-10-CM

## 2017-06-26 MED ORDER — LEVOTHYROXINE SODIUM 100 MCG PO TABS: 100.0000 ug | ORAL_TABLET | Freq: Every day | ORAL | 1 refills | Status: DC

## 2017-06-26 MED ORDER — AMLODIPINE-OLMESARTAN 10-40 MG PO TABS: 1.0000 | ORAL_TABLET | Freq: Every day | ORAL | 1 refills | Status: DC

## 2017-06-26 MED ORDER — ATORVASTATIN CALCIUM 20 MG PO TABS: 20.0000 mg | ORAL_TABLET | Freq: Every day | ORAL | 1 refills | Status: DC

## 2017-06-26 NOTE — Progress Notes (Signed)
  Subjective:   Patient ID: Marce Schartz, female    DOB: May 12, 1955, 62 y.o.   MRN: [id number] CC: Follow-up (3 month) Multiple medical problems HPI: Allesandra Huebsch is a 62 y.o. female presenting for Follow-up (3 month)  Diabetes: Taking medicines regularly  Elevated BMI: Doing aerobic walk tapes most days a week.   Try to avoid sugary foods. Has been pleased with the weight loss recently  Hypothyroidism: Taking her medicine regularly  Hypertension: Taking medicines regularly.  No shortness of breath, chest pain.  Rarely will feel a second or 2 of vertigo.  Had an episode of vertigo about 2 months ago that she was seen in clinic for.  has been better since then.  Not affecting her work.  Relevant past medical, surgical, family and social history reviewed. Allergies and medications reviewed and updated. Social History   Tobacco Use  Smoking Status Never Smoker  Smokeless Tobacco Never Used   ROS: Per HPI   Objective:    BP 121/68   Pulse (!) 51   Temp 97.6 F (36.4 C) (Oral)   Ht '5\' 4"'$  (1.626 m)   Wt 196 lb (88.9 kg)   BMI 33.64 kg/m   Wt Readings from Last 3 Encounters:  06/26/17 196 lb (88.9 kg)  04/16/17 205 lb (93 kg)  03/26/17 206 lb 9.6 oz (93.7 kg)    Gen: NAD, alert, cooperative with exam, NCAT EYES: EOMI, no conjunctival injection, or no icterus CV: NRRR, normal S1/S2, no murmur, distal pulses 2+ b/l Resp: CTABL, no wheezes, normal WOB Abd: +BS, soft, NTND. no guarding or organomegaly Ext: No edema, warm Neuro: Alert and oriented, strength equal b/l UE and LE, coordination grossly normal, sensation intact bilateral upper extremities, CN III-XII intact MSK: normal muscle bulk  Assessment & Plan:  Tyshawn was seen today for follow-up   Multiple medical problems  Diagnoses and all orders for this visit:  Diabetes mellitus type 2 in obese ([place]) -     Bayer DCA Hb A1c Waived  Hyperlipidemia associated with type 2 diabetes mellitus ([place]) -     Lipid panel -      atorvastatin (LIPITOR) 20 MG tablet; Take 1 tablet (20 mg total) by mouth daily at 6 PM.  Hypertension associated with diabetes ([facility]) -     CMP14+EGFR -     amLODipine-olmesartan (AZOR) 10-40 MG tablet; Take 1 tablet by mouth daily.  Hypothyroidism, unspecified type -     TSH -     levothyroxine (SYNTHROID, LEVOTHROID) 100 MCG tablet; Take 1 tablet (100 mcg total) by mouth daily before breakfast.   Follow up plan: Return in about 3 months (around 09/26/2017). [name], MD [place]

## 2017-07-16 ENCOUNTER — Other Ambulatory Visit: Payer: Self-pay | Admitting: Pediatrics

## 2017-07-16 ENCOUNTER — Other Ambulatory Visit: Payer: BLUE CROSS/BLUE SHIELD

## 2017-07-16 DIAGNOSIS — E039 Hypothyroidism, unspecified: Secondary | ICD-10-CM

## 2017-07-16 MED ORDER — LEVOTHYROXINE SODIUM 100 MCG PO TABS: 100.0000 ug | ORAL_TABLET | Freq: Every day | ORAL | 3 refills | Status: DC

## 2017-07-17 IMAGING — CT CT L SPINE W/O CM
3 of 8 series · 11 of 33 positions shown, 13 images · non-contrast
Comparison: MR lumbar spine 06/04/2015

CLINICAL DATA: Lumbar radiculopathy. Right leg weakness. Status
post lumbar fusion 07/02/2015.

EXAM:
CT LUMBAR SPINE WITHOUT CONTRAST
TECHNIQUE: Multidetector CT imaging of the lumbar spine was performed without
intravenous contrast administration. Multiplanar CT image
reconstructions were also generated.

[Series 2: l spine soft · axial · 0.26mm/px · z∈[+914,+1016]mm · 3 of 69 slices shown, 4 images]
[im 18/69  soft-tissue]
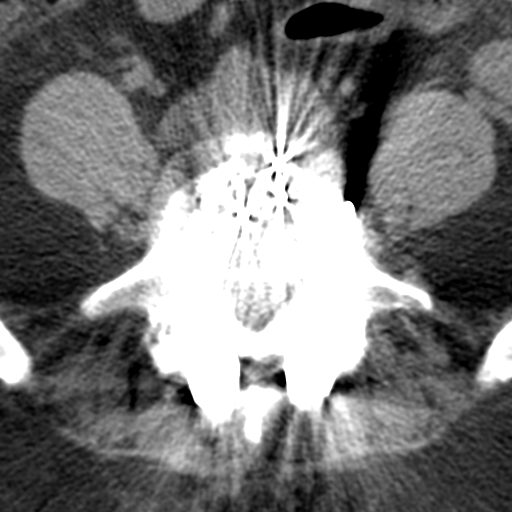
[im 18/69  bone]
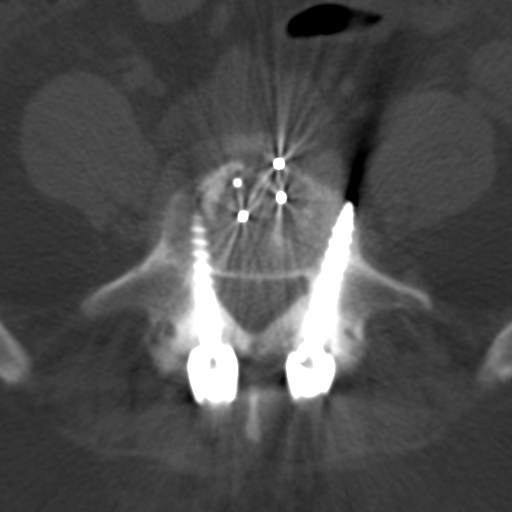
[im 35/69  bone]
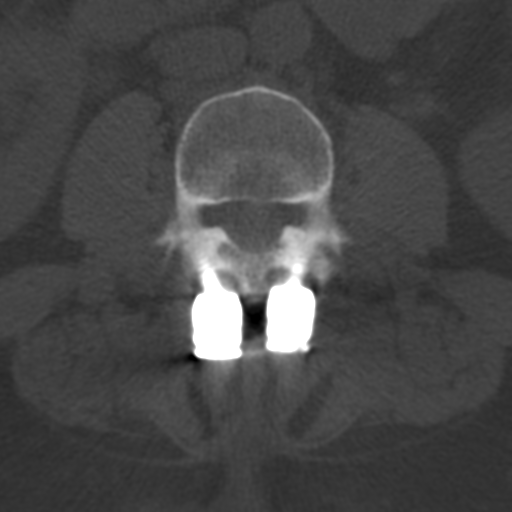
[im 52/69  bone]
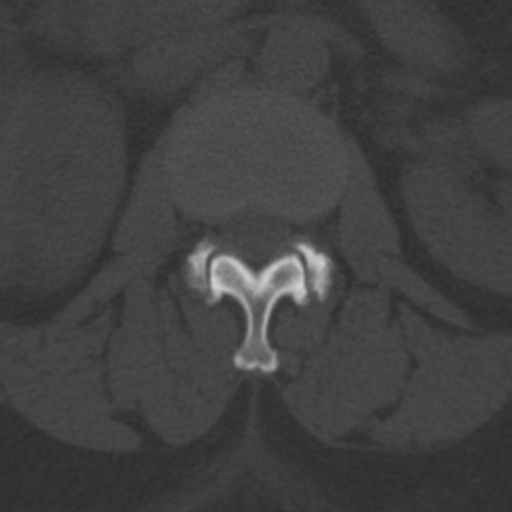

[Series 6: bone cor · coronal · 0.28mm/px · 3 of 43 slices shown]
[im 9/43  bone]
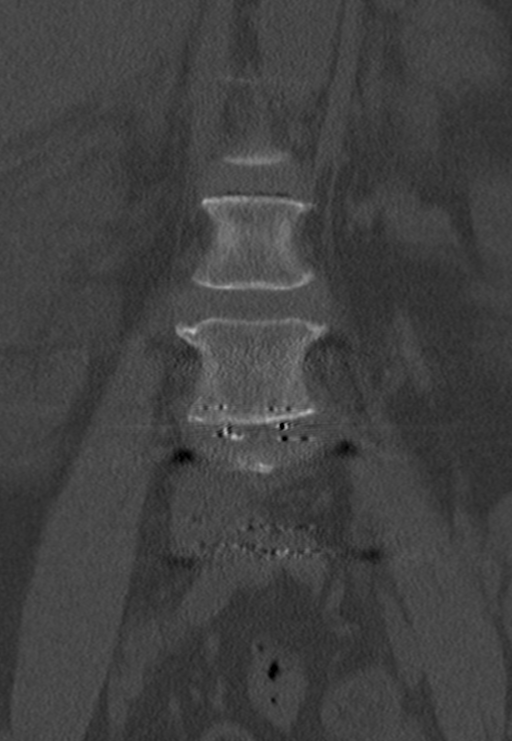
[im 17/43  bone]
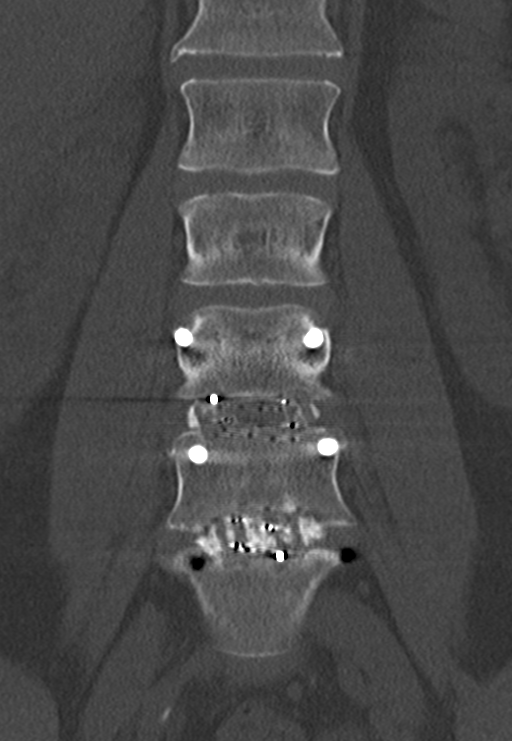
[im 26/43  bone]
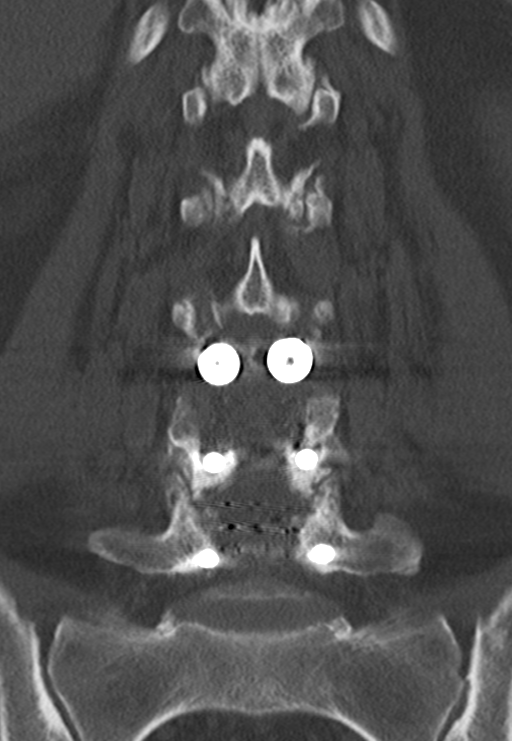

[Series 7: sag bone · sagittal · 0.26mm/px · 5 of 44 slices shown, 6 images]
[im 15/44  bone]
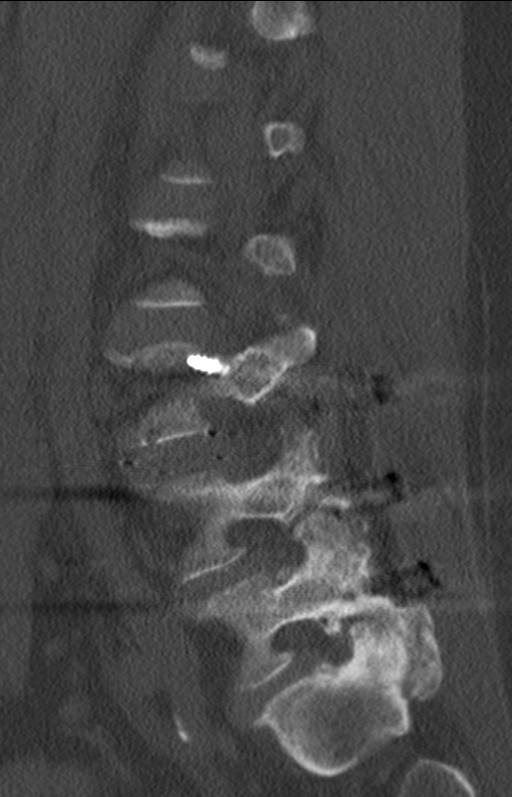
[im 18/44  bone]
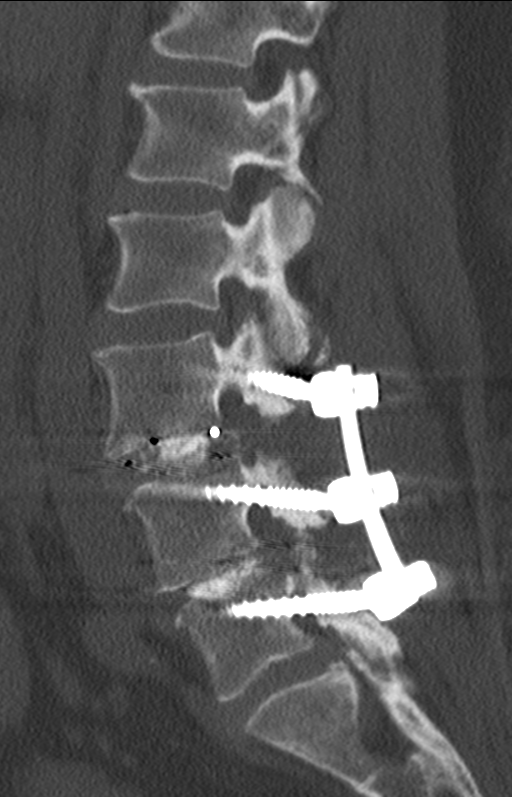
[im 22/44  soft-tissue]
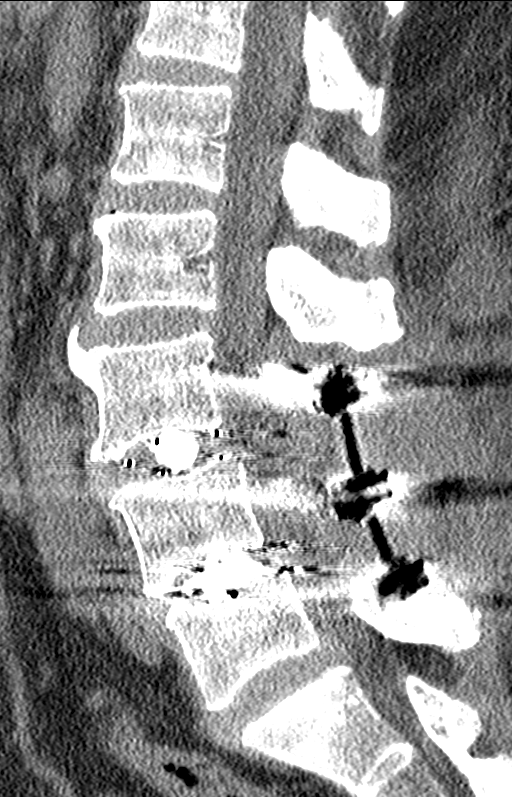
[im 22/44  bone]
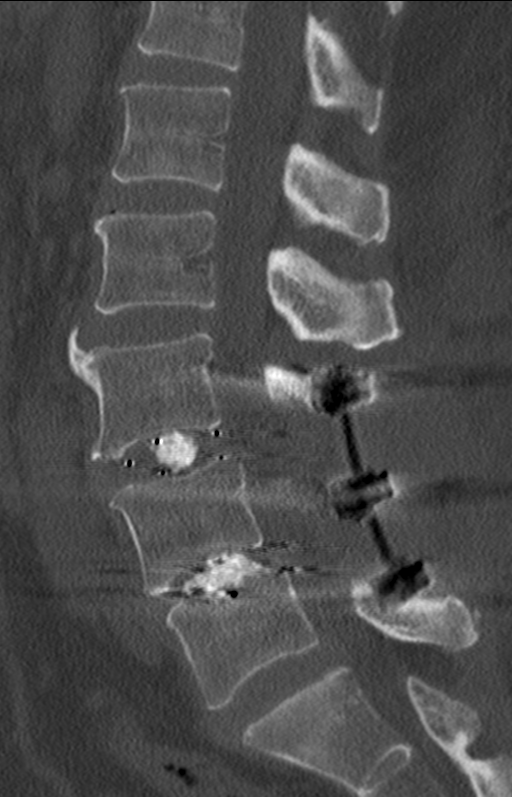
[im 26/44  bone]
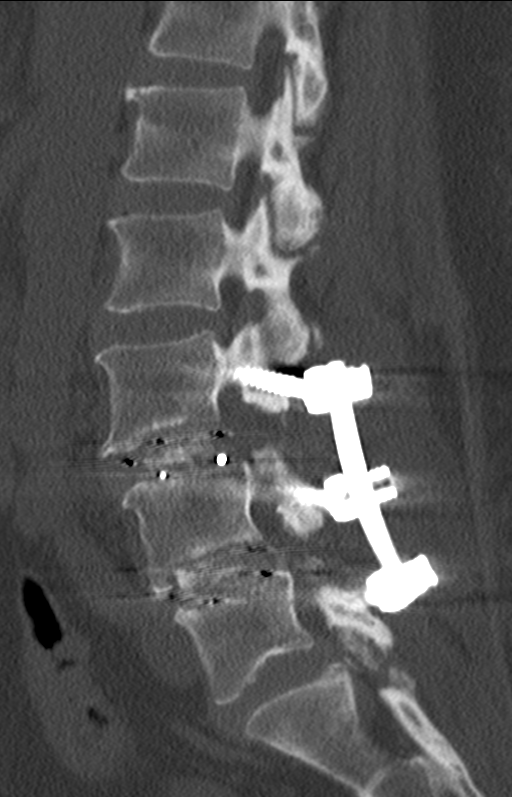
[im 29/44  bone]
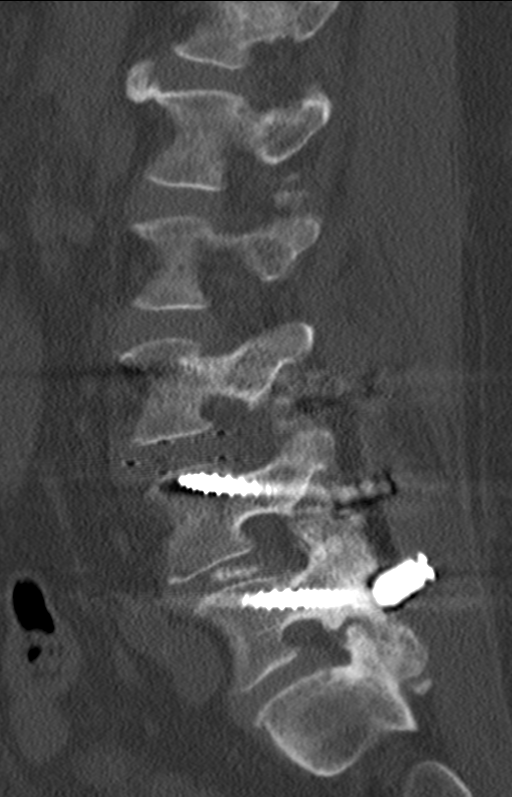

[11 of 33 positions shown; findings below may reference images not displayed]

FINDINGS: The vertebral body heights are maintained. There is 2 mm of
anterolisthesis of L3 on L4. The paravertebral soft tissues are
normal. There is no fracture or static listhesis. There is no
spondylolysis. 5 mm lucent lesion in the right posterior ilium
without cortical destruction of uncertain significance.

Posterior lumbar interbody fusion from L3 through L5 with bilateral
pedicle screws present and interbody spacer devices in satisfactory
position. The right L5 pedicle screw violates the superior cortex of
the right neural foramen. Partial osteo-incorporation at L4-5. The
visualized portion of the SI joints are unremarkable.

Degenerative changes of bilateral sacroiliac joints.

T12-L1: No disc protrusion, foraminal stenosis or central canal
stenosis. Mild bilateral facet arthropathy.

L1-L2: No disc protrusion, foraminal stenosis or central canal
stenosis. Mild bilateral facet arthropathy.

L2-L3: Mild broad-based disc bulge. No foraminal stenosis or central
canal stenosis. Moderate bilateral facet arthropathy.

L3-L4: Limited evaluation secondary to beam hardening artifact
obscuring the intraspinal soft tissues. No significant foraminal
stenosis.

L4-L5: Limited evaluation secondary to beam hardening artifact
obscuring the intraspinal soft tissues. No significant foraminal
stenosis.

L5-S1: Broad-based disc bulge. Severe bilateral facet arthropathy,
right greater than left. No significant foraminal stenosis.
IMPRESSION: 1. Posterior lumbar interbody fusion from L3 through L5 with
bilateral pedicle screws present and interbody spacer devices in
satisfactory position. The right L5 pedicle screw violates the
superior cortex of the right neural foramen. Partial
osteo-incorporation at L4-5.
2. Limited evaluation of the L3-4 and L4-5 intraspinal soft tissues
and foramen secondary to beam hardening artifact.
3. 5 mm lucent lesion in the right posterior ilium without cortical
destruction of uncertain significance. Etiologies include both
benign and malignant processes. Follow-up evaluation in 3-6 months
is recommended.

## 2017-07-22 ENCOUNTER — Encounter: Payer: Self-pay | Admitting: Pediatrics

## 2017-07-22 ENCOUNTER — Ambulatory Visit (INDEPENDENT_AMBULATORY_CARE_PROVIDER_SITE_OTHER): Payer: BLUE CROSS/BLUE SHIELD

## 2017-07-22 ENCOUNTER — Ambulatory Visit: Payer: BLUE CROSS/BLUE SHIELD | Admitting: Pediatrics

## 2017-07-22 VITALS — BP 139/76 | HR 77 | Temp 97.4°F | Ht 64.0 in | Wt 196.2 lb

## 2017-07-22 DIAGNOSIS — M25532 Pain in left wrist: Secondary | ICD-10-CM

## 2017-07-22 DIAGNOSIS — S6292XA Unspecified fracture of left wrist and hand, initial encounter for closed fracture: Secondary | ICD-10-CM | POA: Diagnosis not present

## 2017-07-22 DIAGNOSIS — S52122A Displaced fracture of head of left radius, initial encounter for closed fracture: Secondary | ICD-10-CM | POA: Diagnosis not present

## 2017-07-22 NOTE — Progress Notes (Signed)
  Subjective:   Patient ID: Lisa Stephens, female    DOB: [birth year], 62 y.o.   MRN: [id number] CC: Wrist Pain (Left)  HPI: Lisa Stephens is a 62 y.o. female presenting for Wrist Pain (Left)  Exercising at home last night, was walking backwards, thinks she tripped on something, fell backwards and hit her left wrist on a drum set.  Had pain at that time.  Still hurting today, swollen along ulnar surface distal forearm.  Pain with any movements of wrist.  Relevant past medical, surgical, family and social history reviewed. Allergies and medications reviewed and updated. Social History   Tobacco Use  Smoking Status Never Smoker  Smokeless Tobacco Never Used   ROS: Per HPI   Objective:    BP 139/76   Pulse 77   Temp (!) 97.4 F (36.3 C) (Oral)   Ht 5\' 4"  (1.626 m)   Wt 196 lb 3.2 oz (89 kg)   BMI 33.68 kg/m   Wt Readings from Last 3 Encounters:  07/22/17 196 lb 3.2 oz (89 kg)  06/26/17 196 lb (88.9 kg)  04/16/17 205 lb (93 kg)    Gen: NAD, alert, cooperative with exam, NCAT EYES: EOMI, no conjunctival injection, or no icterus CV: Left radial pulse 2+ Resp:  normal WOB Neuro: Alert and oriented MSK: Mildly swollen left wrist, mildly tender distal forearm, tender palpation anterior and posterior wrist.  X-ray left wrist: "IMPRESSION: Distal radial fracture affecting the dorsal cortex with neutral tilt of the distal radial articular surface."  Assessment & Plan:  Lisa Stephens was seen today for wrist pain.  Diagnoses and all orders for this visit:  Left wrist pain X-ray with fracture.  Will refer to orthopedic surgery.  Note given for work.  Has appointment today with ortho this afternoon. -     DG Wrist Complete Left; Future -     Ambulatory referral to Orthopedic Surgery   Follow up plan: As needed [name], MD [place]

## 2017-07-29 DIAGNOSIS — S6292XA Unspecified fracture of left wrist and hand, initial encounter for closed fracture: Secondary | ICD-10-CM | POA: Diagnosis not present

## 2017-08-05 DIAGNOSIS — E039 Hypothyroidism, unspecified: Secondary | ICD-10-CM | POA: Diagnosis not present

## 2017-08-05 DIAGNOSIS — E559 Vitamin D deficiency, unspecified: Secondary | ICD-10-CM | POA: Diagnosis not present

## 2017-08-05 DIAGNOSIS — E785 Hyperlipidemia, unspecified: Secondary | ICD-10-CM | POA: Diagnosis not present

## 2017-08-05 DIAGNOSIS — Z008 Encounter for other general examination: Secondary | ICD-10-CM | POA: Diagnosis not present

## 2017-08-05 DIAGNOSIS — Z719 Counseling, unspecified: Secondary | ICD-10-CM | POA: Diagnosis not present

## 2017-08-10 DIAGNOSIS — E669 Obesity, unspecified: Secondary | ICD-10-CM | POA: Diagnosis not present

## 2017-08-10 DIAGNOSIS — Z713 Dietary counseling and surveillance: Secondary | ICD-10-CM | POA: Diagnosis not present

## 2017-08-10 DIAGNOSIS — E7800 Pure hypercholesterolemia, unspecified: Secondary | ICD-10-CM | POA: Diagnosis not present

## 2017-08-10 DIAGNOSIS — S6292XA Unspecified fracture of left wrist and hand, initial encounter for closed fracture: Secondary | ICD-10-CM | POA: Diagnosis not present

## 2017-08-10 DIAGNOSIS — I10 Essential (primary) hypertension: Secondary | ICD-10-CM | POA: Diagnosis not present

## 2017-08-21 DIAGNOSIS — S52522D Torus fracture of lower end of left radius, subsequent encounter for fracture with routine healing: Secondary | ICD-10-CM | POA: Diagnosis not present

## 2017-08-21 DIAGNOSIS — S52501A Unspecified fracture of the lower end of right radius, initial encounter for closed fracture: Secondary | ICD-10-CM

## 2017-08-21 HISTORY — DX: Unspecified fracture of the lower end of right radius, initial encounter for closed fracture: S52.501A

## 2017-08-27 ENCOUNTER — Other Ambulatory Visit (INDEPENDENT_AMBULATORY_CARE_PROVIDER_SITE_OTHER): Payer: BLUE CROSS/BLUE SHIELD

## 2017-08-27 ENCOUNTER — Other Ambulatory Visit: Payer: Self-pay | Admitting: Orthopedic Surgery

## 2017-08-27 DIAGNOSIS — S52522D Torus fracture of lower end of left radius, subsequent encounter for fracture with routine healing: Secondary | ICD-10-CM | POA: Diagnosis not present

## 2017-08-27 DIAGNOSIS — S62102D Fracture of unspecified carpal bone, left wrist, subsequent encounter for fracture with routine healing: Secondary | ICD-10-CM

## 2017-09-06 ENCOUNTER — Other Ambulatory Visit: Payer: Self-pay | Admitting: Pediatrics

## 2017-09-06 DIAGNOSIS — E039 Hypothyroidism, unspecified: Secondary | ICD-10-CM

## 2017-09-12 DIAGNOSIS — S52522D Torus fracture of lower end of left radius, subsequent encounter for fracture with routine healing: Secondary | ICD-10-CM | POA: Diagnosis not present

## 2017-09-15 DIAGNOSIS — Z1231 Encounter for screening mammogram for malignant neoplasm of breast: Secondary | ICD-10-CM | POA: Diagnosis not present

## 2017-09-26 DIAGNOSIS — S52522D Torus fracture of lower end of left radius, subsequent encounter for fracture with routine healing: Secondary | ICD-10-CM | POA: Diagnosis not present

## 2017-09-28 ENCOUNTER — Encounter: Payer: Self-pay | Admitting: Pediatrics

## 2017-09-28 ENCOUNTER — Ambulatory Visit: Payer: BLUE CROSS/BLUE SHIELD | Admitting: Pediatrics

## 2017-09-28 VITALS — BP 139/79 | HR 60 | Temp 97.5°F | Ht 64.0 in | Wt 203.8 lb

## 2017-09-28 DIAGNOSIS — E1169 Type 2 diabetes mellitus with other specified complication: Secondary | ICD-10-CM

## 2017-09-28 DIAGNOSIS — E669 Obesity, unspecified: Secondary | ICD-10-CM

## 2017-09-28 DIAGNOSIS — I10 Essential (primary) hypertension: Secondary | ICD-10-CM

## 2017-09-28 DIAGNOSIS — E039 Hypothyroidism, unspecified: Secondary | ICD-10-CM | POA: Diagnosis not present

## 2017-09-28 LAB — BAYER DCA HB A1C WAIVED: HB A1C (BAYER DCA - WAIVED): 6.9 % (ref <7.0)

## 2017-09-28 MED ORDER — METFORMIN HCL 500 MG PO TABS: ORAL_TABLET | ORAL | 2 refills | Status: DC

## 2017-09-28 NOTE — Progress Notes (Signed)
  Subjective:   Patient ID: Lisa Stephens, female    DOB: 08-Feb-1956, 62 y.o.   MRN: [id number] CC: Medical Management of Chronic Issues  HPI: Lisa Stephens is a 62 y.o. female   Diabetes: Weight up some of the last few weeks.  She recently broke her wrist, has not been walking as much after that but hopes to get back to regular activity.  Hyperlipidemia: Taking statin every night now.  Says she noticed change in her breathing after taking it every day at the beginning, is not bothering her now.  No rashes.  Hypertension: Taking her medicines regularly.  No lightheadedness, dizziness, chest pain or shortness of breath  Relevant past medical, surgical, family and social history reviewed. Allergies and medications reviewed and updated. Social History   Tobacco Use  Smoking Status Never Smoker  Smokeless Tobacco Never Used   ROS: Per HPI   Objective:    BP 139/79   Pulse 60   Temp (!) 97.5 F (36.4 C) (Oral)   Ht 5\' 4"  (1.626 m)   Wt 203 lb 12.8 oz (92.4 kg)   BMI 34.98 kg/m   Wt Readings from Last 3 Encounters:  09/28/17 203 lb 12.8 oz (92.4 kg)  07/22/17 196 lb 3.2 oz (89 kg)  06/26/17 196 lb (88.9 kg)    Gen: NAD, alert, cooperative with exam, NCAT EYES: EOMI, no conjunctival injection, or no icterus LYMPH: no cervical LAD Neck: Normal thyroid CV: NRRR, normal S1/S2, no murmur, distal pulses 2+ b/l Resp: CTABL, no wheezes, normal WOB Abd: +BS, soft, NTND. no guarding or organomegaly Ext: No edema, warm Neuro: Alert and oriented  Assessment & Plan:  Keora was seen today for medical management of chronic issues.  Diagnoses and all orders for this visit:  Diabetes mellitus type 2 in obese ([facility]) A1c 6.9.  Continue metformin.  Patient to increase physical activity, decreasing sugary food intake. -     Bayer DCA Hb A1c Waived -     metFORMIN (GLUCOPHAGE) 500 MG tablet; TAKE 1 TABLET BY MOUTH TWO  TIMES DAILY WITH MEALS  Essential hypertension Stable, continue current  meds -     Basic Metabolic Panel  Hypothyroidism, unspecified type Stable, due for TSH.  Continue levothyroxine. -     TSH  Hyperlipidemia Has fasting labs scheduled for work in 3 months.  She will bring to her next appointment here  Follow up plan: Return in about 6 months (around 03/30/2018). Lisa Found, MD [place]

## 2017-09-29 LAB — BASIC METABOLIC PANEL
BUN/Creatinine Ratio: 14 (ref 12–28)
BUN: 9 mg/dL (ref 8–27)
CALCIUM: 9.3 mg/dL (ref 8.7–10.3)
CO2: 22 mmol/L (ref 20–29)
CREATININE: 0.65 mg/dL (ref 0.57–1.00)
Chloride: 103 mmol/L (ref 96–106)
GFR calc Af Amer: 111 mL/min/{1.73_m2} (ref >59)
GFR calc non Af Amer: 96 mL/min/{1.73_m2} (ref >59)
GLUCOSE: 154 mg/dL — AB (ref 65–99)
Potassium: 4.4 mmol/L (ref 3.5–5.2)
Sodium: 139 mmol/L (ref 134–144)

## 2017-09-29 LAB — TSH: TSH: 1.55 u[IU]/mL (ref 0.450–4.500)

## 2017-12-09 DIAGNOSIS — E785 Hyperlipidemia, unspecified: Secondary | ICD-10-CM | POA: Diagnosis not present

## 2017-12-09 DIAGNOSIS — E039 Hypothyroidism, unspecified: Secondary | ICD-10-CM | POA: Diagnosis not present

## 2017-12-09 DIAGNOSIS — Z139 Encounter for screening, unspecified: Secondary | ICD-10-CM | POA: Diagnosis not present

## 2017-12-09 DIAGNOSIS — Z0130 Encounter for examination of blood pressure without abnormal findings: Secondary | ICD-10-CM | POA: Diagnosis not present

## 2017-12-09 DIAGNOSIS — E119 Type 2 diabetes mellitus without complications: Secondary | ICD-10-CM | POA: Diagnosis not present

## 2017-12-09 DIAGNOSIS — Z79899 Other long term (current) drug therapy: Secondary | ICD-10-CM | POA: Diagnosis not present

## 2017-12-23 DIAGNOSIS — E039 Hypothyroidism, unspecified: Secondary | ICD-10-CM | POA: Diagnosis not present

## 2017-12-23 DIAGNOSIS — E785 Hyperlipidemia, unspecified: Secondary | ICD-10-CM | POA: Diagnosis not present

## 2017-12-23 DIAGNOSIS — E559 Vitamin D deficiency, unspecified: Secondary | ICD-10-CM | POA: Diagnosis not present

## 2017-12-23 DIAGNOSIS — Z719 Counseling, unspecified: Secondary | ICD-10-CM | POA: Diagnosis not present

## 2017-12-29 DIAGNOSIS — Z23 Encounter for immunization: Secondary | ICD-10-CM | POA: Diagnosis not present

## 2018-01-19 ENCOUNTER — Encounter: Payer: Self-pay | Admitting: *Deleted

## 2018-01-25 DIAGNOSIS — Z713 Dietary counseling and surveillance: Secondary | ICD-10-CM | POA: Diagnosis not present

## 2018-01-25 DIAGNOSIS — I10 Essential (primary) hypertension: Secondary | ICD-10-CM | POA: Diagnosis not present

## 2018-01-25 DIAGNOSIS — E669 Obesity, unspecified: Secondary | ICD-10-CM | POA: Diagnosis not present

## 2018-01-25 DIAGNOSIS — E7800 Pure hypercholesterolemia, unspecified: Secondary | ICD-10-CM | POA: Diagnosis not present

## 2018-03-17 DIAGNOSIS — Z719 Counseling, unspecified: Secondary | ICD-10-CM | POA: Diagnosis not present

## 2018-03-17 DIAGNOSIS — Z008 Encounter for other general examination: Secondary | ICD-10-CM | POA: Diagnosis not present

## 2018-03-17 DIAGNOSIS — E039 Hypothyroidism, unspecified: Secondary | ICD-10-CM | POA: Diagnosis not present

## 2018-03-17 DIAGNOSIS — E559 Vitamin D deficiency, unspecified: Secondary | ICD-10-CM | POA: Diagnosis not present

## 2018-04-01 ENCOUNTER — Ambulatory Visit: Payer: BLUE CROSS/BLUE SHIELD | Admitting: Pediatrics

## 2018-04-05 ENCOUNTER — Ambulatory Visit: Payer: BLUE CROSS/BLUE SHIELD | Admitting: Pediatrics

## 2018-04-05 ENCOUNTER — Encounter: Payer: Self-pay | Admitting: Pediatrics

## 2018-04-05 VITALS — BP 140/81 | HR 66 | Temp 97.3°F | Ht 64.0 in | Wt 202.6 lb

## 2018-04-05 DIAGNOSIS — E669 Obesity, unspecified: Secondary | ICD-10-CM | POA: Diagnosis not present

## 2018-04-05 DIAGNOSIS — E1159 Type 2 diabetes mellitus with other circulatory complications: Secondary | ICD-10-CM

## 2018-04-05 DIAGNOSIS — E1169 Type 2 diabetes mellitus with other specified complication: Secondary | ICD-10-CM | POA: Diagnosis not present

## 2018-04-05 DIAGNOSIS — I152 Hypertension secondary to endocrine disorders: Secondary | ICD-10-CM

## 2018-04-05 DIAGNOSIS — E785 Hyperlipidemia, unspecified: Secondary | ICD-10-CM

## 2018-04-05 DIAGNOSIS — I10 Essential (primary) hypertension: Secondary | ICD-10-CM | POA: Diagnosis not present

## 2018-04-05 DIAGNOSIS — E039 Hypothyroidism, unspecified: Secondary | ICD-10-CM | POA: Diagnosis not present

## 2018-04-05 LAB — BAYER DCA HB A1C WAIVED: HB A1C (BAYER DCA - WAIVED): 6.6 % (ref <7.0)

## 2018-04-05 MED ORDER — LEVOTHYROXINE SODIUM 100 MCG PO TABS: ORAL_TABLET | ORAL | 2 refills | Status: DC

## 2018-04-05 MED ORDER — ATORVASTATIN CALCIUM 20 MG PO TABS: 20.0000 mg | ORAL_TABLET | Freq: Every day | ORAL | 1 refills | Status: DC

## 2018-04-05 MED ORDER — METFORMIN HCL 500 MG PO TABS: ORAL_TABLET | ORAL | 2 refills | Status: DC

## 2018-04-05 MED ORDER — AMLODIPINE-OLMESARTAN 10-40 MG PO TABS: 1.0000 | ORAL_TABLET | Freq: Every day | ORAL | 1 refills | Status: DC

## 2018-04-05 NOTE — Progress Notes (Signed)
  Subjective:   Patient ID: Lisa Stephens, female    DOB: May 11, 1955, 61 y.o.   MRN: [id number] CC: Medical Management of Chronic Issues  HPI: Lisa Stephens is a 62 y.o. female   Diabetes: More indiscretions with diet over the last few weeks during the holidays.  Taking medicines regularly.  Hypothyroidism: Taking medicine regularly.  Hyperlipidemia: Tolerating statin, no side effects  Hypertension: Taking medicine regularly.  No shortness of breath or chest pain with exertion.  Elevated BMI: Patient open to increasing physical activity at home.  Having some leg cramps, worst at night.  Happens after she lies down.  She notices it more in the days that she works, on her feet for 12 hours versus the days that she is off.  She is recently started some over-the-counter vitamins to see if that will help.  Relevant past medical, surgical, family and social history reviewed. Allergies and medications reviewed and updated. Social History   Tobacco Use  Smoking Status Never Smoker  Smokeless Tobacco Never Used   ROS: Per HPI   Objective:    BP 140/81   Pulse 66   Temp (!) 97.3 F (36.3 C) (Oral)   Ht '5\' 4"'$  (1.626 m)   Wt 202 lb 9.6 oz (91.9 kg)   BMI 34.78 kg/m   Wt Readings from Last 3 Encounters:  04/05/18 202 lb 9.6 oz (91.9 kg)  09/28/17 203 lb 12.8 oz (92.4 kg)  07/22/17 196 lb 3.2 oz (89 kg)    Gen: NAD, alert, cooperative with exam, NCAT EYES: EOMI, no conjunctival injection, or no icterus ENT:  TMs pearly gray b/l, OP without erythema LYMPH: no cervical LAD CV: NRRR, normal S1/S2, no murmur, distal pulses 2+ b/l Resp: CTABL, no wheezes, normal WOB Abd: +BS, soft, NTND. no guarding or organomegaly Ext: No edema, warm Neuro: Alert and oriented, strength equal b/l UE and LE, coordination grossly normal MSK: normal muscle bulk  Assessment & Plan:  [name] was seen today for medical management of chronic issues.  Diagnoses and all orders for this visit:  Diabetes  mellitus type 2 in obese ([facility]) A1c 6.6, down from 6.9 at last check.  Continue avoiding sugary foods.  Continue current medicines.  Increase physical activity as able -     Bayer DCA Hb A1c Waived -     Microalbumin / creatinine urine ratio -     metFORMIN (GLUCOPHAGE) 500 MG tablet; TAKE 1 TABLET BY MOUTH TWO  TIMES DAILY WITH MEALS  Hypothyroidism, unspecified type Stable, continue below -     levothyroxine (SYNTHROID, LEVOTHROID) 100 MCG tablet; TAKE 1 TABLET BY MOUTH  DAILY BEFORE BREAKFAST -     TSH  Hyperlipidemia associated with type 2 diabetes mellitus ([facility]) Stable, continue below -     atorvastatin (LIPITOR) 20 MG tablet; Take 1 tablet (20 mg total) by mouth daily at 6 PM.  Hypertension associated with diabetes ([place]) Recent start of leg cramps.  Will check BMP.  Can try magnesium at home, gentle stretches, walking prior to bed. -     amLODipine-olmesartan (AZOR) 10-40 MG tablet; Take 1 tablet by mouth daily. -     BMP8+EGFR   Follow up plan: Return in about 6 months (around 10/05/2018). [name], MD [place]

## 2018-04-06 LAB — BMP8+EGFR
BUN/Creatinine Ratio: 19 (ref 12–28)
BUN: 13 mg/dL (ref 8–27)
CO2: 22 mmol/L (ref 20–29)
Calcium: 9.5 mg/dL (ref 8.7–10.3)
Chloride: 101 mmol/L (ref 96–106)
Creatinine, Ser: 0.67 mg/dL (ref 0.57–1.00)
GFR calc Af Amer: 110 mL/min/{1.73_m2} (ref >59)
GFR, EST NON AFRICAN AMERICAN: 95 mL/min/{1.73_m2} (ref >59)
Glucose: 102 mg/dL — ABNORMAL HIGH (ref 65–99)
Potassium: 4.3 mmol/L (ref 3.5–5.2)
Sodium: 139 mmol/L (ref 134–144)

## 2018-04-06 LAB — TSH: TSH: 1.81 u[IU]/mL (ref 0.450–4.500)

## 2018-05-04 ENCOUNTER — Other Ambulatory Visit: Payer: Self-pay | Admitting: Physician Assistant

## 2018-05-04 ENCOUNTER — Telehealth: Payer: Self-pay

## 2018-05-04 MED ORDER — METFORMIN HCL ER 500 MG PO TB24: 500.0000 mg | ORAL_TABLET | Freq: Every day | ORAL | 2 refills | Status: DC

## 2018-05-04 NOTE — Telephone Encounter (Signed)
Pharmacy called stating that patient has been taking Metformin ER from another provider and her new script from Dr. Evette Doffing on 04/05/18 is not for ER.  Pharmacy is wanting to know if patient should be taking the ER or should they fill the new rx that Dr. Evette Doffing sent in. Covering PCP - please advise and send back to pools.

## 2018-05-04 NOTE — Telephone Encounter (Signed)
Sent ER script to pharmacy

## 2018-05-06 ENCOUNTER — Other Ambulatory Visit: Payer: Self-pay | Admitting: *Deleted

## 2018-05-06 MED ORDER — METFORMIN HCL 500 MG PO TABS: 500.0000 mg | ORAL_TABLET | Freq: Two times a day (BID) | ORAL | 1 refills | Status: DC

## 2018-05-06 NOTE — Telephone Encounter (Signed)
Fax from [facility] Clarification request Recent Rx sent in for Metformin 500 mg 1 QD, previously Metformin ER 500 mg 2 QPM Per written response by Chevis Pretty, FNP faxed back to [facility] Metformin 500 mg 1 BID #180 with 1 RF Chart updated

## 2018-05-07 ENCOUNTER — Other Ambulatory Visit: Payer: Self-pay | Admitting: Physician Assistant

## 2018-05-07 MED ORDER — METFORMIN HCL 500 MG PO TABS: 500.0000 mg | ORAL_TABLET | Freq: Two times a day (BID) | ORAL | 1 refills | Status: DC

## 2018-05-12 DIAGNOSIS — E559 Vitamin D deficiency, unspecified: Secondary | ICD-10-CM | POA: Diagnosis not present

## 2018-05-12 DIAGNOSIS — Z008 Encounter for other general examination: Secondary | ICD-10-CM | POA: Diagnosis not present

## 2018-05-12 DIAGNOSIS — I10 Essential (primary) hypertension: Secondary | ICD-10-CM | POA: Diagnosis not present

## 2018-05-12 DIAGNOSIS — E039 Hypothyroidism, unspecified: Secondary | ICD-10-CM | POA: Diagnosis not present

## 2018-06-09 DIAGNOSIS — I10 Essential (primary) hypertension: Secondary | ICD-10-CM | POA: Diagnosis not present

## 2018-06-28 DIAGNOSIS — E039 Hypothyroidism, unspecified: Secondary | ICD-10-CM | POA: Diagnosis not present

## 2018-06-28 DIAGNOSIS — E559 Vitamin D deficiency, unspecified: Secondary | ICD-10-CM | POA: Diagnosis not present

## 2018-06-28 DIAGNOSIS — E119 Type 2 diabetes mellitus without complications: Secondary | ICD-10-CM | POA: Diagnosis not present

## 2018-06-28 DIAGNOSIS — E785 Hyperlipidemia, unspecified: Secondary | ICD-10-CM | POA: Diagnosis not present

## 2018-06-28 DIAGNOSIS — Z79899 Other long term (current) drug therapy: Secondary | ICD-10-CM | POA: Diagnosis not present

## 2018-09-22 ENCOUNTER — Other Ambulatory Visit: Payer: Self-pay | Admitting: *Deleted

## 2018-09-22 DIAGNOSIS — E785 Hyperlipidemia, unspecified: Secondary | ICD-10-CM

## 2018-09-22 DIAGNOSIS — E1169 Type 2 diabetes mellitus with other specified complication: Secondary | ICD-10-CM

## 2018-09-22 NOTE — Telephone Encounter (Signed)
Last seen Lisa Stephens = NTBS

## 2018-10-05 ENCOUNTER — Other Ambulatory Visit: Payer: Self-pay

## 2018-10-06 ENCOUNTER — Encounter: Payer: Self-pay | Admitting: Family Medicine

## 2018-10-06 ENCOUNTER — Encounter (INDEPENDENT_AMBULATORY_CARE_PROVIDER_SITE_OTHER): Payer: Self-pay

## 2018-10-06 ENCOUNTER — Ambulatory Visit: Payer: BLUE CROSS/BLUE SHIELD | Admitting: Family Medicine

## 2018-10-06 VITALS — BP 128/68 | HR 74 | Temp 97.8°F | Ht 64.0 in | Wt 203.6 lb

## 2018-10-06 DIAGNOSIS — E1159 Type 2 diabetes mellitus with other circulatory complications: Secondary | ICD-10-CM

## 2018-10-06 DIAGNOSIS — I152 Hypertension secondary to endocrine disorders: Secondary | ICD-10-CM

## 2018-10-06 DIAGNOSIS — I10 Essential (primary) hypertension: Secondary | ICD-10-CM | POA: Diagnosis not present

## 2018-10-06 DIAGNOSIS — E785 Hyperlipidemia, unspecified: Secondary | ICD-10-CM

## 2018-10-06 DIAGNOSIS — E039 Hypothyroidism, unspecified: Secondary | ICD-10-CM

## 2018-10-06 DIAGNOSIS — Z1211 Encounter for screening for malignant neoplasm of colon: Secondary | ICD-10-CM

## 2018-10-06 DIAGNOSIS — E1169 Type 2 diabetes mellitus with other specified complication: Secondary | ICD-10-CM | POA: Diagnosis not present

## 2018-10-06 DIAGNOSIS — E669 Obesity, unspecified: Secondary | ICD-10-CM

## 2018-10-06 LAB — BAYER DCA HB A1C WAIVED: HB A1C (BAYER DCA - WAIVED): 6.5 % (ref <7.0)

## 2018-10-06 MED ORDER — LEVOTHYROXINE SODIUM 100 MCG PO TABS: ORAL_TABLET | ORAL | 2 refills | Status: DC

## 2018-10-06 MED ORDER — METFORMIN HCL 500 MG PO TABS: 500.0000 mg | ORAL_TABLET | Freq: Two times a day (BID) | ORAL | 1 refills | Status: DC

## 2018-10-06 MED ORDER — ATORVASTATIN CALCIUM 20 MG PO TABS: ORAL_TABLET | ORAL | 1 refills | Status: DC

## 2018-10-06 NOTE — Patient Instructions (Addendum)
It was a pleasure seeing you today, Lisa Stephens.  Information regarding what we discussed is included in this packet.  Please make an appointment to see me in 6 months for your annual physical exam.  Please get your mammogram and colon cancer screening done.  Please feel free to call our office if any questions or concerns arise.  Warm Regards, Kyler Germer M. Lajuana Ripple, DO   Semaglutide injection solution What is this medicine? SEMAGLUTIDE (Sem a GLOO tide) is used to improve blood sugar control in adults with type 2 diabetes. This medicine may be used with other diabetes medicines. This drug may also reduce the risk of heart attack or stroke if you have type 2 diabetes and risk factors for heart disease. This medicine may be used for other purposes; ask your health care provider or pharmacist if you have questions. COMMON BRAND NAME(S): OZEMPIC What should I tell my health care provider before I take this medicine? They need to know if you have any of these conditions:  endocrine tumors (MEN 2) or if someone in your family had these tumors  eye disease, vision problems  history of pancreatitis  kidney disease  stomach problems  thyroid cancer or if someone in your family had thyroid cancer  an unusual or allergic reaction to semaglutide, other medicines, foods, dyes, or preservatives  pregnant or trying to get pregnant  breast-feeding How should I use this medicine? This medicine is for injection under the skin of your upper leg (thigh), stomach area, or upper arm. It is given once every week (every 7 days). You will be taught how to prepare and give this medicine. Use exactly as directed. Take your medicine at regular intervals. Do not take it more often than directed. If you use this medicine with insulin, you should inject this medicine and the insulin separately. Do not mix them together. Do not give the injections right next to each other. Change (rotate) injection sites with each  injection. It is important that you put your used needles and syringes in a special sharps container. Do not put them in a trash can. If you do not have a sharps container, call your pharmacist or healthcare provider to get one. A special MedGuide will be given to you by the pharmacist with each prescription and refill. Be sure to read this information carefully each time. Talk to your pediatrician regarding the use of this medicine in children. Special care may be needed. Overdosage: If you think you have taken too much of this medicine contact a poison control center or emergency room at once. NOTE: This medicine is only for you. Do not share this medicine with others. What if I miss a dose? If you miss a dose, take it as soon as you can within 5 days after the missed dose. Then take your next dose at your regular weekly time. If it has been longer than 5 days after the missed dose, do not take the missed dose. Take the next dose at your regular time. Do not take double or extra doses. If you have questions about a missed dose, contact your health care provider for advice. What may interact with this medicine?  other medicines for diabetes Many medications may cause changes in blood sugar, these include:  alcohol containing beverages  antiviral medicines for HIV or AIDS  aspirin and aspirin-like drugs  certain medicines for blood pressure, heart disease, irregular heart beat  chromium  diuretics  female hormones, such as estrogens or  progestins, birth control pills  fenofibrate  gemfibrozil  isoniazid  lanreotide  female hormones or anabolic steroids  MAOIs like Carbex, Eldepryl, Marplan, Nardil, and Parnate  medicines for weight loss  medicines for allergies, asthma, cold, or cough  medicines for depression, anxiety, or psychotic disturbances  niacin  nicotine  NSAIDs, medicines for pain and inflammation, like ibuprofen or  naproxen  octreotide  pasireotide  pentamidine  phenytoin  probenecid  quinolone antibiotics such as ciprofloxacin, levofloxacin, ofloxacin  some herbal dietary supplements  steroid medicines such as prednisone or cortisone  sulfamethoxazole; trimethoprim  thyroid hormones Some medications can hide the warning symptoms of low blood sugar (hypoglycemia). You may need to monitor your blood sugar more closely if you are taking one of these medications. These include:  beta-blockers, often used for high blood pressure or heart problems (examples include atenolol, metoprolol, propranolol)  clonidine  guanethidine  reserpine This list may not describe all possible interactions. Give your health care provider a list of all the medicines, herbs, non-prescription drugs, or dietary supplements you use. Also tell them if you smoke, drink alcohol, or use illegal drugs. Some items may interact with your medicine. What should I watch for while using this medicine? Visit your doctor or health care professional for regular checks on your progress. Drink plenty of fluids while taking this medicine. Check with your doctor or health care professional if you get an attack of severe diarrhea, nausea, and vomiting. The loss of too much body fluid can make it dangerous for you to take this medicine. A test called the HbA1C (A1C) will be monitored. This is a simple blood test. It measures your blood sugar control over the last 2 to 3 months. You will receive this test every 3 to 6 months. Learn how to check your blood sugar. Learn the symptoms of low and high blood sugar and how to manage them. Always carry a quick-source of sugar with you in case you have symptoms of low blood sugar. Examples include hard sugar candy or glucose tablets. Make sure others know that you can choke if you eat or drink when you develop serious symptoms of low blood sugar, such as seizures or unconsciousness. They must get  medical help at once. Tell your doctor or health care professional if you have high blood sugar. You might need to change the dose of your medicine. If you are sick or exercising more than usual, you might need to change the dose of your medicine. Do not skip meals. Ask your doctor or health care professional if you should avoid alcohol. Many nonprescription cough and cold products contain sugar or alcohol. These can affect blood sugar. Pens should never be shared. Even if the needle is changed, sharing may result in passing of viruses like hepatitis or HIV. Wear a medical ID bracelet or chain, and carry a card that describes your disease and details of your medicine and dosage times. Do not become pregnant while taking this medicine. Women should inform their doctor if they wish to become pregnant or think they might be pregnant. There is a potential for serious side effects to an unborn child. Talk to your health care professional or pharmacist for more information. What side effects may I notice from receiving this medicine? Side effects that you should report to your doctor or health care professional as soon as possible:  allergic reactions like skin rash, itching or hives, swelling of the face, lips, or tongue  breathing problems  changes  in vision  diarrhea that continues or is severe  lump or swelling on the neck  severe nausea  signs and symptoms of infection like fever or chills; cough; sore throat; pain or trouble passing urine  signs and symptoms of low blood sugar such as feeling anxious, confusion, dizziness, increased hunger, unusually weak or tired, sweating, shakiness, cold, irritable, headache, blurred vision, fast heartbeat, loss of consciousness  signs and symptoms of kidney injury like trouble passing urine or change in the amount of urine  trouble swallowing  unusual stomach upset or pain  vomiting Side effects that usually do not require medical attention  (report to your doctor or health care professional if they continue or are bothersome):  constipation  diarrhea  nausea  pain, redness, or irritation at site where injected  stomach upset This list may not describe all possible side effects. Call your doctor for medical advice about side effects. You may report side effects to FDA at 1-800-FDA-1088. Where should I keep my medicine? Keep out of the reach of children. Store unopened pens in a refrigerator between 2 and 8 degrees C (36 and 46 degrees F). Do not freeze. Protect from light and heat. After you first use the pen, it can be stored for 56 days at room temperature between 15 and 30 degrees C (59 and 86 degrees F) or in a refrigerator. Throw away your used pen after 56 days or after the expiration date, whichever comes first. Do not store your pen with the needle attached. If the needle is left on, medicine may leak from the pen. NOTE: This sheet is a summary. It may not cover all possible information. If you have questions about this medicine, talk to your doctor, pharmacist, or health care provider.  2020 Elsevier/Gold Standard (2018-04-23 14:25:32)

## 2018-10-06 NOTE — Progress Notes (Signed)
Subjective: CC: DM2 PCP: Janora Norlander, DO [id number] Lisa Stephens is a 63 y.o. female presenting to clinic today for:  1. Type 2 Diabetes w/ HLD and HTN:  Patient reports compliance with Azor 10-40mg , Metformin 500mg  BID.  She takes 1 tablet of Lipitor 20mg  3 days/week as this is all that she can tolerate.  She notes that she has side effects if she tries to take it daily.  Last eye exam: Up-to-date, goes to my eye doctor Last foot exam: Needs Last A1c:  Lab Results  Component Value Date   HGBA1C 6.5 10/06/2018   Nephropathy screen indicated?:  On ARB Last flu, zoster and/or pneumovax:  Immunization History  Administered Date(s) Administered  . Influenza Whole 01/23/2010, 01/27/2012, 01/25/2013, 02/02/2014, 01/18/2015  . Influenza,inj,Quad PF,6+ Mos 12/26/2016  . Influenza-Unspecified 01/06/2015, 01/15/2016, 12/29/2017  . Pneumococcal Polysaccharide-23 10/16/2014  . Tdap 01/25/2013  . Zoster 01/15/2016    ROS: denies chest pain, shortness of breath, sensation changes in the feet, change in vision.  2.  Hypothyroidism Patient reports longstanding history of hypothyroidism.  Denies any history of surgery or radiation to the neck.  She does have occasional chronic constipation that is relieved by a stool softener.  She denies any change in weight, tremor, heart palpitations, change in voice or difficulty swallowing.  She has a thyroid issue was found incidentally after she was having difficulty with her menses.  She has history of partial hysterectomy.  She is postmenopausal.  3.  Preventative care Patient is due for mammogram and colonoscopy.  She will schedule mammogram when she leaves today.  She notes that she had her last colonoscopy in [place] and it is been about 10 years.  Her doctors since moved to [place].  ROS: Per HPI  Allergies  Allergen Reactions  . Voltaren [Diclofenac Sodium] Shortness Of Breath and Other (See Comments)    Wheezing, coughing, runny nose    Past Medical History:  Diagnosis Date  . Arthritis   . Depression   . Diabetes mellitus without complication ([place])   . Hypertension   . Hypothyroidism     Current Outpatient Medications:  .  amLODipine-olmesartan (AZOR) 10-40 MG tablet, Take 1 tablet by mouth daily., Disp: 90 tablet, Rfl: 1 .  aspirin EC 81 MG tablet, Take 81 mg by mouth daily., Disp: , Rfl:  .  atorvastatin (LIPITOR) 20 MG tablet, Take 1 tablet (20 mg total) by mouth daily at 6 PM., Disp: 90 tablet, Rfl: 1 .  levothyroxine (SYNTHROID, LEVOTHROID) 100 MCG tablet, TAKE 1 TABLET BY MOUTH  DAILY BEFORE BREAKFAST, Disp: 90 tablet, Rfl: 2 .  metFORMIN (GLUCOPHAGE) 500 MG tablet, Take 1 tablet (500 mg total) by mouth 2 (two) times daily with a meal., Disp: 180 tablet, Rfl: 1 .  Omega-3 Fatty Acids (FISH OIL) 1000 MG CAPS, Take 1,000 mg by mouth daily., Disp: , Rfl:  Social History   Socioeconomic History  . Marital status: Single    Spouse name: Not on file  . Number of children: Not on file  . Years of education: Not on file  . Highest education level: Not on file  Occupational History  . Not on file  Social Needs  . Financial resource strain: Not on file  . Food insecurity    Worry: Not on file    Inability: Not on file  . Transportation needs    Medical: Not on file    Non-medical: Not on file  Tobacco Use  . Smoking status: Never  Smoker  . Smokeless tobacco: Never Used  Substance and Sexual Activity  . Alcohol use: No  . Drug use: No  . Sexual activity: Not on file  Lifestyle  . Physical activity    Days per week: Not on file    Minutes per session: Not on file  . Stress: Not on file  Relationships  . Social Herbalist on phone: Not on file    Gets together: Not on file    Attends religious service: Not on file    Active member of club or organization: Not on file    Attends meetings of clubs or organizations: Not on file    Relationship status: Not on file  . Intimate partner violence     Fear of current or ex partner: Not on file    Emotionally abused: Not on file    Physically abused: Not on file    Forced sexual activity: Not on file  Other Topics Concern  . Not on file  Social History Narrative  . Not on file   Family History  Problem Relation Age of Onset  . Diabetes Mother   . Hypertension Mother   . Diabetes Sister   . Hypertension Sister   . Diabetes Brother   . Early death Son   . Diabetes Maternal Aunt   . Diabetes Maternal Uncle     Objective: Office vital signs reviewed. BP 128/68   Pulse 74   Temp 97.8 F (36.6 C) (Oral)   Ht 5\' 4"  (1.626 m)   Wt 203 lb 9.6 oz (92.4 kg)   BMI 34.95 kg/m   Physical Examination:  General: Awake, alert, well nourished, No acute distress HEENT: Normal    Neck: No masses palpated. No lymphadenopathy; no goiter    Eyes: PERRLA, extraocular membranes intact, sclera white; no exophthalmos Cardio: regular rate and rhythm, S1S2 heard, no murmurs appreciated Pulm: clear to auscultation bilaterally, no wheezes, rhonchi or rales; normal work of breathing on room air Extremities: warm, well perfused, No edema, cyanosis or clubbing; +2 pulses bilaterally MSK: Normal gait and station Skin: dry; intact; no rashes or lesions; normal temperature Neuro: No resting tremor  Diabetic Foot Exam - Simple   Simple Foot Form Diabetic Foot exam was performed with the following findings: Yes 10/06/2018  3:56 PM  Visual Inspection No deformities, no ulcerations, no other skin breakdown bilaterally: Yes Sensation Testing Intact to touch and monofilament testing bilaterally: Yes Pulse Check Posterior Tibialis and Dorsalis pulse intact bilaterally: Yes Comments    Assessment/ Plan: 63 y.o. female   1. Diabetes mellitus type 2 in obese (HCC) Controlled.  A1c of 6.5 today.  Continue metformin as prescribed.  I have adjusted her cholesterol dosing schedule per tolerance.  I reviewed labs that were performed in March at her job,  [employer]. - Bayer DCA Hb A1c Waived - metFORMIN (GLUCOPHAGE) 500 MG tablet; Take 1 tablet (500 mg total) by mouth 2 (two) times daily with a meal.  Dispense: 180 tablet; Refill: 1  2. Hyperlipidemia associated with type 2 diabetes mellitus (HCC) Continue statin. - atorvastatin (LIPITOR) 20 MG tablet; Take 1 tablet 3 days per week for cholesterol.  Dispense: 90 tablet; Refill: 1  3. Hypertension associated with diabetes ([place]) Controlled.  Check BMP given use of ARB - Basic Metabolic Panel  4. Hypothyroidism, unspecified type Check thyroid panel. - Thyroid Panel With TSH - levothyroxine (SYNTHROID) 100 MCG tablet; TAKE 1 TABLET BY MOUTH  DAILY BEFORE BREAKFAST  Dispense: 90 tablet; Refill: 2  5. Screen for colon cancer - Ambulatory referral to Gastroenterology   Orders Placed This Encounter  Procedures  . Bayer DCA Hb A1c Waived  . Basic Metabolic Panel  . Thyroid Panel With TSH    Standing Status:   Future    Standing Expiration Date:   10/06/2019  . Ambulatory referral to Gastroenterology    Referral Priority:   Routine    Referral Type:   Consultation    Referral Reason:   Specialty Services Required    Number of Visits Requested:   1   Meds ordered this encounter  Medications  . levothyroxine (SYNTHROID) 100 MCG tablet    Sig: TAKE 1 TABLET BY MOUTH  DAILY BEFORE BREAKFAST    Dispense:  90 tablet    Refill:  2  . metFORMIN (GLUCOPHAGE) 500 MG tablet    Sig: Take 1 tablet (500 mg total) by mouth 2 (two) times daily with a meal.    Dispense:  180 tablet    Refill:  1  . atorvastatin (LIPITOR) 20 MG tablet    Sig: Take 1 tablet 3 days per week for cholesterol.    Dispense:  90 tablet    Refill:  Lisa Stephens, Lisa Stephens [phone]

## 2018-10-07 LAB — BASIC METABOLIC PANEL
BUN/Creatinine Ratio: 18 (ref 12–28)
BUN: 14 mg/dL (ref 8–27)
CO2: 19 mmol/L — ABNORMAL LOW (ref 20–29)
Calcium: 9.4 mg/dL (ref 8.7–10.3)
Chloride: 103 mmol/L (ref 96–106)
Creatinine, Ser: 0.77 mg/dL (ref 0.57–1.00)
GFR calc Af Amer: 96 mL/min/{1.73_m2} (ref >59)
GFR calc non Af Amer: 83 mL/min/{1.73_m2} (ref >59)
Glucose: 126 mg/dL — ABNORMAL HIGH (ref 65–99)
Potassium: 4 mmol/L (ref 3.5–5.2)
Sodium: 142 mmol/L (ref 134–144)

## 2018-10-11 ENCOUNTER — Encounter: Payer: Self-pay | Admitting: Gastroenterology

## 2018-10-29 ENCOUNTER — Other Ambulatory Visit: Payer: Self-pay | Admitting: *Deleted

## 2018-10-29 DIAGNOSIS — I152 Hypertension secondary to endocrine disorders: Secondary | ICD-10-CM

## 2018-10-29 DIAGNOSIS — E1159 Type 2 diabetes mellitus with other circulatory complications: Secondary | ICD-10-CM

## 2018-10-29 MED ORDER — AMLODIPINE-OLMESARTAN 10-40 MG PO TABS: 1.0000 | ORAL_TABLET | Freq: Every day | ORAL | 1 refills | Status: DC

## 2018-11-05 DIAGNOSIS — Z1231 Encounter for screening mammogram for malignant neoplasm of breast: Secondary | ICD-10-CM | POA: Diagnosis not present

## 2018-11-08 ENCOUNTER — Other Ambulatory Visit: Payer: Self-pay

## 2018-11-08 ENCOUNTER — Ambulatory Visit: Payer: Self-pay | Admitting: *Deleted

## 2018-11-08 VITALS — Ht 64.0 in | Wt 200.0 lb

## 2018-11-08 DIAGNOSIS — Z1211 Encounter for screening for malignant neoplasm of colon: Secondary | ICD-10-CM

## 2018-11-08 MED ORDER — NA SULFATE-K SULFATE-MG SULF 17.5-3.13-1.6 GM/177ML PO SOLN: 1.0000 | Freq: Once | ORAL | 0 refills | Status: AC

## 2018-11-08 NOTE — Progress Notes (Signed)

## 2018-11-19 ENCOUNTER — Telehealth: Payer: Self-pay | Admitting: Gastroenterology

## 2018-11-19 NOTE — Telephone Encounter (Signed)
°

## 2018-11-22 ENCOUNTER — Encounter: Payer: Self-pay | Admitting: Gastroenterology

## 2018-11-22 ENCOUNTER — Other Ambulatory Visit: Payer: Self-pay

## 2018-11-22 ENCOUNTER — Ambulatory Visit (AMBULATORY_SURGERY_CENTER): Payer: BC Managed Care – PPO | Admitting: Gastroenterology

## 2018-11-22 VITALS — BP 118/71 | HR 65 | Temp 97.8°F | Resp 12 | Ht 64.0 in | Wt 200.0 lb

## 2018-11-22 DIAGNOSIS — K635 Polyp of colon: Secondary | ICD-10-CM

## 2018-11-22 DIAGNOSIS — Z1211 Encounter for screening for malignant neoplasm of colon: Secondary | ICD-10-CM

## 2018-11-22 DIAGNOSIS — D125 Benign neoplasm of sigmoid colon: Secondary | ICD-10-CM

## 2018-11-22 MED ORDER — SODIUM CHLORIDE 0.9 % IV SOLN: 500.0000 mL | Freq: Once | INTRAVENOUS | Status: DC

## 2018-11-22 NOTE — Patient Instructions (Signed)
Handouts Provided:  Polyps  YOU HAD AN ENDOSCOPIC PROCEDURE TODAY AT [facility]:   Refer to the procedure report that was given to you for any specific questions about what was found during the examination.  If the procedure report does not answer your questions, please call your gastroenterologist to clarify.  If you requested that your care partner not be given the details of your procedure findings, then the procedure report has been included in a sealed envelope for you to review at your convenience later.  YOU SHOULD EXPECT: Some feelings of bloating in the abdomen. Passage of more gas than usual.  Walking can help get rid of the air that was put into your GI tract during the procedure and reduce the bloating. If you had a lower endoscopy (such as a colonoscopy or flexible sigmoidoscopy) you may notice spotting of blood in your stool or on the toilet paper. If you underwent a bowel prep for your procedure, you may not have a normal bowel movement for a few days.  Please Note:  You might notice some irritation and congestion in your nose or some drainage.  This is from the oxygen used during your procedure.  There is no need for concern and it should clear up in a day or so.  SYMPTOMS TO REPORT IMMEDIATELY:   Following lower endoscopy (colonoscopy or flexible sigmoidoscopy):  Excessive amounts of blood in the stool  Significant tenderness or worsening of abdominal pains  Swelling of the abdomen that is new, acute  Fever of 100F or higher  For urgent or emergent issues, a gastroenterologist can be reached at any hour by calling [phone].   DIET:  We do recommend a small meal at first, but then you may proceed to your regular diet.  Drink plenty of fluids but you should avoid alcoholic beverages for 24 hours.  ACTIVITY:  You should plan to take it easy for the rest of today and you should NOT DRIVE or use heavy machinery until tomorrow (because of the sedation  medicines used during the test).    FOLLOW UP: Our staff will call the number listed on your records 48-72 hours following your procedure to check on you and address any questions or concerns that you may have regarding the information given to you following your procedure. If we do not reach you, we will leave a message.  We will attempt to reach you two times.  During this call, we will ask if you have developed any symptoms of COVID 19. If you develop any symptoms (ie: fever, flu-like symptoms, shortness of breath, cough etc.) before then, please call [phone].  If you test positive for Covid 19 in the 2 weeks post procedure, please call and report this information to [place].    If any biopsies were taken you will be contacted by phone or by letter within the next 1-3 weeks.  Please call us at [phone] if you have not heard about the biopsies in 3 weeks.    SIGNATURES/CONFIDENTIALITY: You and/or your care partner have signed paperwork which will be entered into your electronic medical record.  These signatures attest to the fact that that the information above on your After Visit Summary has been reviewed and is understood.  Full responsibility of the confidentiality of this discharge information lies with you and/or your care-partner.

## 2018-11-22 NOTE — Progress Notes (Signed)
Pt's states no medical or surgical changes since previsit or office visit.  Temp per June VS per Courtney 

## 2018-11-22 NOTE — Progress Notes (Signed)
A/ox3, pleased with MAC, report to RN 

## 2018-11-22 NOTE — Op Note (Signed)
[facility] Patient Name: Lisa Stephens Procedure Date: 11/22/2018 9:46 AM MRN: [id number] Endoscopist: Remo Lipps P. Havery Moros , MD Age: 63 Referring MD:  Date of Birth: 11/30/1955 Gender: Female Account #: [id number] Procedure:                Colonoscopy Indications:              Screening for colorectal malignant neoplasm Medicines:                Monitored Anesthesia Care Procedure:                Pre-Anesthesia Assessment:                           - Prior to the procedure, a History and Physical                            was performed, and patient medications and                            allergies were reviewed. The patient's tolerance of                            previous anesthesia was also reviewed. The risks                            and benefits of the procedure and the sedation                            options and risks were discussed with the patient.                            All questions were answered, and informed consent                            was obtained. Prior Anticoagulants: The patient has                            taken no previous anticoagulant or antiplatelet                            agents. ASA Grade Assessment: II - A patient with                            mild systemic disease. After reviewing the risks                            and benefits, the patient was deemed in                            satisfactory condition to undergo the procedure.                           After obtaining informed consent, the colonoscope  was passed under direct vision. Throughout the                            procedure, the patient's blood pressure, pulse, and                            oxygen saturations were monitored continuously. The                            Colonoscope was introduced through the anus and                            advanced to the the cecum, identified by                            appendiceal orifice and  ileocecal valve. The                            colonoscopy was performed without difficulty. The                            patient tolerated the procedure well. The quality                            of the bowel preparation was good. The ileocecal                            valve, appendiceal orifice, and rectum were                            photographed. Scope In: 9:58:59 AM Scope Out: 10:18:41 AM Scope Withdrawal Time: 0 hours 13 minutes 47 seconds  Total Procedure Duration: 0 hours 19 minutes 42 seconds  Findings:                 The perianal and digital rectal examinations were                            normal.                           A few small-mouthed diverticula were found in the                            sigmoid colon.                           A 3 mm polyp was found in the sigmoid colon. The                            polyp was sessile. The polyp was removed with a                            cold snare. Resection and retrieval were complete.  The exam was otherwise without abnormality. Complications:            No immediate complications. Estimated blood loss:                            Minimal. Estimated Blood Loss:     Estimated blood loss was minimal. Impression:               - Diverticulosis in the sigmoid colon.                           - One 3 mm polyp in the sigmoid colon, removed with                            a cold snare. Resected and retrieved.                           - The examination was otherwise normal. Recommendation:           - Patient has a contact number available for                            emergencies. The signs and symptoms of potential                            delayed complications were discussed with the                            patient. Return to normal activities tomorrow.                            Written discharge instructions were provided to the                            patient.                            - Resume previous diet.                           - Continue present medications.                           - Await pathology results. Remo Lipps P. Armbruster, MD 11/22/2018 10:21:17 AM This report has been signed electronically.

## 2018-11-23 DIAGNOSIS — R921 Mammographic calcification found on diagnostic imaging of breast: Secondary | ICD-10-CM | POA: Diagnosis not present

## 2018-11-23 DIAGNOSIS — R928 Other abnormal and inconclusive findings on diagnostic imaging of breast: Secondary | ICD-10-CM | POA: Diagnosis not present

## 2018-11-24 ENCOUNTER — Telehealth: Payer: Self-pay

## 2018-11-24 ENCOUNTER — Encounter: Payer: Self-pay | Admitting: Family Medicine

## 2018-11-24 NOTE — Telephone Encounter (Signed)
Left message on answering machine. 

## 2018-11-26 ENCOUNTER — Encounter: Payer: Self-pay | Admitting: Gastroenterology

## 2018-12-08 DIAGNOSIS — Z139 Encounter for screening, unspecified: Secondary | ICD-10-CM | POA: Diagnosis not present

## 2018-12-08 DIAGNOSIS — I10 Essential (primary) hypertension: Secondary | ICD-10-CM | POA: Diagnosis not present

## 2018-12-08 DIAGNOSIS — E119 Type 2 diabetes mellitus without complications: Secondary | ICD-10-CM | POA: Diagnosis not present

## 2018-12-08 DIAGNOSIS — E039 Hypothyroidism, unspecified: Secondary | ICD-10-CM | POA: Diagnosis not present

## 2018-12-08 DIAGNOSIS — Z79899 Other long term (current) drug therapy: Secondary | ICD-10-CM | POA: Diagnosis not present

## 2018-12-08 DIAGNOSIS — E785 Hyperlipidemia, unspecified: Secondary | ICD-10-CM | POA: Diagnosis not present

## 2018-12-08 DIAGNOSIS — E559 Vitamin D deficiency, unspecified: Secondary | ICD-10-CM | POA: Diagnosis not present

## 2018-12-22 DIAGNOSIS — E785 Hyperlipidemia, unspecified: Secondary | ICD-10-CM | POA: Diagnosis not present

## 2018-12-22 DIAGNOSIS — E119 Type 2 diabetes mellitus without complications: Secondary | ICD-10-CM | POA: Diagnosis not present

## 2018-12-22 DIAGNOSIS — E039 Hypothyroidism, unspecified: Secondary | ICD-10-CM | POA: Diagnosis not present

## 2018-12-22 DIAGNOSIS — E559 Vitamin D deficiency, unspecified: Secondary | ICD-10-CM | POA: Diagnosis not present

## 2019-01-19 DIAGNOSIS — Z23 Encounter for immunization: Secondary | ICD-10-CM | POA: Diagnosis not present

## 2019-02-18 ENCOUNTER — Other Ambulatory Visit: Payer: Self-pay | Admitting: Family Medicine

## 2019-04-06 ENCOUNTER — Other Ambulatory Visit: Payer: Self-pay

## 2019-04-06 ENCOUNTER — Ambulatory Visit: Payer: BC Managed Care – PPO | Admitting: Family Medicine

## 2019-04-06 ENCOUNTER — Encounter: Payer: Self-pay | Admitting: Family Medicine

## 2019-04-06 VITALS — BP 134/73 | HR 63 | Temp 98.4°F | Ht 64.0 in | Wt 197.0 lb

## 2019-04-06 DIAGNOSIS — E1169 Type 2 diabetes mellitus with other specified complication: Secondary | ICD-10-CM

## 2019-04-06 DIAGNOSIS — E039 Hypothyroidism, unspecified: Secondary | ICD-10-CM

## 2019-04-06 DIAGNOSIS — E785 Hyperlipidemia, unspecified: Secondary | ICD-10-CM

## 2019-04-06 DIAGNOSIS — E1159 Type 2 diabetes mellitus with other circulatory complications: Secondary | ICD-10-CM

## 2019-04-06 DIAGNOSIS — E669 Obesity, unspecified: Secondary | ICD-10-CM

## 2019-04-06 DIAGNOSIS — I10 Essential (primary) hypertension: Secondary | ICD-10-CM

## 2019-04-06 DIAGNOSIS — I152 Hypertension secondary to endocrine disorders: Secondary | ICD-10-CM

## 2019-04-06 LAB — BAYER DCA HB A1C WAIVED: HB A1C (BAYER DCA - WAIVED): 6.2 % (ref <7.0)

## 2019-04-06 MED ORDER — METFORMIN HCL 500 MG PO TABS: 500.0000 mg | ORAL_TABLET | Freq: Two times a day (BID) | ORAL | 3 refills | Status: DC

## 2019-04-06 MED ORDER — LEVOTHYROXINE SODIUM 100 MCG PO TABS: ORAL_TABLET | ORAL | 2 refills | Status: DC

## 2019-04-06 NOTE — Patient Instructions (Addendum)
Your sugar looks fantastic.  It is better than it was last time we saw each other and was down to 6.2.  This probably has a lot to do with the fact that you are exercising and watching your diet.  Keep up the good work!  I have added your thyroid panel to today's labs and I will contact you with that on Monday.  In the meantime, I sent over the prescription she requested.  See me back in 6 months unless you need me sooner.  Stay safe and stay healthy.

## 2019-04-06 NOTE — Progress Notes (Signed)
Subjective: CC: DM2 PCP: Janora Norlander, DO [id number] Lisa Stephens is a 63 y.o. female presenting to clinic today for:  1. Type 2 Diabetes w/ HLD and HTN:  Patient reports compliance with Azor 10-40mg , Metformin 500mg  BID.  She takes 1 tablet of Lipitor 20mg  3 days/week.  She has been really trying to work on weight loss and was successful getting down to 192 pounds.  She has been watching what she eats and avoiding sugary foods.  She is walking regularly.  Plans to transition over the treadmill soon because of the cold weather.  She comes that she had her mammogram done in [place].  She brings to me recent laboratory work from her employer.  Back in September her A1c had gone up to 6.7 and that is when she started making these big lifestyle modifications.  Last eye exam: Up-to-date, goes to my eye doctor Last foot exam: Needs Last A1c:  Lab Results  Component Value Date   HGBA1C 6.5 10/06/2018   Nephropathy screen indicated?:  On ARB Last flu, zoster and/or pneumovax:  Immunization History  Administered Date(s) Administered  . Influenza Whole 01/23/2010, 01/27/2012, 01/25/2013, 02/02/2014, 01/18/2015  . Influenza,inj,Quad PF,6+ Mos 12/26/2016  . Influenza-Unspecified 01/06/2015, 01/15/2016, 12/29/2017, 01/19/2019  . Pneumococcal Polysaccharide-23 10/16/2014  . Tdap 01/25/2013  . Zoster 01/15/2016    ROS: denies chest pain, shortness of breath, sensation changes in the feet, change in vision.  2.  Hypothyroidism Longstanding history of hypothyroidism.  No history of surgery or radiation to the neck.    She reports compliance with Synthroid 100 mcg daily.  No change in voice, difficulty swallowing, heart palpitations or tremor.  She has occasional constipation that is relieved by OTC remedies.  Her TSH in September was within normal range.  T4 was not checked.  ROS: Per HPI  Allergies  Allergen Reactions  . Voltaren [Diclofenac Sodium] Shortness Of Breath and Other (See  Comments)    Wheezing, coughing, runny nose   Past Medical History:  Diagnosis Date  . Arthritis   . Blood transfusion without reported diagnosis   . Cataract    small beginning  . Depression   . Diabetes mellitus without complication ([place])   . Hyperlipidemia   . Hypertension   . Hypothyroidism     Current Outpatient Medications:  .  amLODipine-olmesartan (AZOR) 10-40 MG tablet, Take 1 tablet by mouth daily., Disp: 90 tablet, Rfl: 1 .  aspirin EC 81 MG tablet, Take 81 mg by mouth daily., Disp: , Rfl:  .  atorvastatin (LIPITOR) 20 MG tablet, Take 1 tablet 3 days per week for cholesterol., Disp: 90 tablet, Rfl: 1 .  Coenzyme Q10 (CO Q 10 PO), Take by mouth., Disp: , Rfl:  .  levothyroxine (SYNTHROID) 100 MCG tablet, TAKE 1 TABLET BY MOUTH  DAILY BEFORE BREAKFAST, Disp: 90 tablet, Rfl: 2 .  metFORMIN (GLUCOPHAGE) 500 MG tablet, Take 1 tablet (500 mg total) by mouth 2 (two) times daily with a meal., Disp: 180 tablet, Rfl: 1 .  Omega-3 Fatty Acids (FISH OIL) 1000 MG CAPS, Take 1,000 mg by mouth daily., Disp: , Rfl:  .  VITAMIN D PO, Take by mouth., Disp: , Rfl:  Social History   Socioeconomic History  . Marital status: Single    Spouse name: Not on file  . Number of children: Not on file  . Years of education: Not on file  . Highest education level: Not on file  Occupational History  . Not on file  Tobacco Use  . Smoking status: Never Smoker  . Smokeless tobacco: Never Used  Substance and Sexual Activity  . Alcohol use: No  . Drug use: No  . Sexual activity: Not on file  Other Topics Concern  . Not on file  Social History Narrative  . Not on file   Social Determinants of Health   Financial Resource Strain:   . Difficulty of Paying Living Expenses: Not on file  Food Insecurity:   . Worried About Charity fundraiser in the Last Year: Not on file  . Ran Out of Food in the Last Year: Not on file  Transportation Needs:   . Lack of Transportation (Medical): Not on file  .  Lack of Transportation (Non-Medical): Not on file  Physical Activity:   . Days of Exercise per Week: Not on file  . Minutes of Exercise per Session: Not on file  Stress:   . Feeling of Stress : Not on file  Social Connections:   . Frequency of Communication with Friends and Family: Not on file  . Frequency of Social Gatherings with Friends and Family: Not on file  . Attends Religious Services: Not on file  . Active Member of Clubs or Organizations: Not on file  . Attends Archivist Meetings: Not on file  . Marital Status: Not on file  Intimate Partner Violence:   . Fear of Current or Ex-Partner: Not on file  . Emotionally Abused: Not on file  . Physically Abused: Not on file  . Sexually Abused: Not on file   Family History  Problem Relation Age of Onset  . Diabetes Mother   . Hypertension Mother   . Diabetes Sister   . Hypertension Sister   . Colon polyps Sister   . Diabetes Brother   . Early death Son   . Diabetes Maternal Aunt   . Colon cancer Maternal Aunt   . Diabetes Maternal Uncle   . Esophageal cancer Neg Hx   . Rectal cancer Neg Hx   . Stomach cancer Neg Hx     Objective: Office vital signs reviewed. BP 134/73   Pulse 63   Temp 98.4 F (36.9 C) (Temporal)   Ht 5\' 4"  (1.626 m)   Wt 197 lb (89.4 kg)   SpO2 97%   BMI 33.81 kg/m   Physical Examination:  General: Awake, alert, well nourished, No acute distress HEENT: Normal; no exophthalmos.  No goiter. Cardio: regular rate and rhythm, S1S2 heard, no murmurs appreciated Pulm: clear to auscultation bilaterally, no wheezes, rhonchi or rales; normal work of breathing on room air Extremities: warm, well perfused, No edema, cyanosis or clubbing; +2 pulses bilaterally Neuro: No resting tremor  Assessment/ Plan: 63 y.o. female   1. 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  Her lifestyle modifications have made quite an impact on her A1c, which was down to 6.2 today.  I  congratulated her on her journey to improve her health. - hgba1c - metFORMIN (GLUCOPHAGE) 500 MG tablet; Take 1 tablet (500 mg total) by mouth 2 (two) times daily with a meal.  Dispense: 180 tablet; Refill: 3  2. Hyperlipidemia associated with type 2 diabetes mellitus (HCC) Continue statin.  Plan for fasting lipid panel at next visit in 6 months  3. Hypertension associated with diabetes ([place]) Well-controlled.  Continue current regimen  4. Acquired hypothyroidism Asymptomatic - Thyroid Panel With TSH - levothyroxine (SYNTHROID) 100 MCG tablet; TAKE 1 TABLET BY MOUTH  DAILY  BEFORE BREAKFAST  Dispense: 90 tablet; Refill: 2    Orders Placed This Encounter  Procedures  . hgba1c   No orders of the defined types were placed in this encounter.    Janora Norlander, DO [place] [phone]

## 2019-04-07 LAB — THYROID PANEL WITH TSH
Free Thyroxine Index: 2.1 (ref 1.2–4.9)
T3 Uptake Ratio: 23 % — ABNORMAL LOW (ref 24–39)
T4, Total: 9 ug/dL (ref 4.5–12.0)
TSH: 1.61 u[IU]/mL (ref 0.450–4.500)

## 2019-04-13 ENCOUNTER — Other Ambulatory Visit: Payer: Self-pay | Admitting: Family Medicine

## 2019-04-13 DIAGNOSIS — I152 Hypertension secondary to endocrine disorders: Secondary | ICD-10-CM

## 2019-04-13 DIAGNOSIS — E1159 Type 2 diabetes mellitus with other circulatory complications: Secondary | ICD-10-CM

## 2019-06-07 LAB — HM DIABETES EYE EXAM

## 2019-06-09 ENCOUNTER — Telehealth: Payer: Self-pay

## 2019-06-09 NOTE — Telephone Encounter (Signed)
LMTCB in regards to follow up breast imaging.

## 2019-06-10 ENCOUNTER — Telehealth: Payer: Self-pay | Admitting: Family Medicine

## 2019-07-27 ENCOUNTER — Other Ambulatory Visit: Payer: Self-pay

## 2019-07-27 DIAGNOSIS — R920 Mammographic microcalcification found on diagnostic imaging of breast: Secondary | ICD-10-CM

## 2019-07-27 DIAGNOSIS — R928 Other abnormal and inconclusive findings on diagnostic imaging of breast: Secondary | ICD-10-CM

## 2019-08-01 ENCOUNTER — Inpatient Hospital Stay
Admission: RE | Admit: 2019-08-01 | Discharge: 2019-08-01 | Disposition: A | Discharge status: Home / Self Care | Payer: Self-pay | Source: Ambulatory Visit | Attending: Family Medicine | Admitting: Family Medicine

## 2019-08-01 ENCOUNTER — Other Ambulatory Visit: Payer: Self-pay | Admitting: Family Medicine

## 2019-08-01 DIAGNOSIS — R920 Mammographic microcalcification found on diagnostic imaging of breast: Secondary | ICD-10-CM

## 2019-08-01 DIAGNOSIS — R928 Other abnormal and inconclusive findings on diagnostic imaging of breast: Secondary | ICD-10-CM

## 2019-08-16 ENCOUNTER — Ambulatory Visit (HOSPITAL_COMMUNITY)
Admission: RE | Admit: 2019-08-16 | Discharge: 2019-08-16 | Disposition: A | Discharge status: Home / Self Care | Payer: BC Managed Care – PPO | Source: Ambulatory Visit | Attending: Family Medicine | Admitting: Family Medicine

## 2019-08-16 ENCOUNTER — Other Ambulatory Visit: Payer: Self-pay

## 2019-08-16 DIAGNOSIS — R928 Other abnormal and inconclusive findings on diagnostic imaging of breast: Secondary | ICD-10-CM

## 2019-08-16 DIAGNOSIS — R920 Mammographic microcalcification found on diagnostic imaging of breast: Secondary | ICD-10-CM | POA: Diagnosis not present

## 2019-08-16 DIAGNOSIS — N6489 Other specified disorders of breast: Secondary | ICD-10-CM | POA: Diagnosis not present

## 2019-08-16 DIAGNOSIS — R921 Mammographic calcification found on diagnostic imaging of breast: Secondary | ICD-10-CM | POA: Diagnosis not present

## 2019-09-22 ENCOUNTER — Other Ambulatory Visit: Payer: Self-pay | Admitting: Family Medicine

## 2019-09-22 DIAGNOSIS — I152 Hypertension secondary to endocrine disorders: Secondary | ICD-10-CM

## 2019-09-22 DIAGNOSIS — E1159 Type 2 diabetes mellitus with other circulatory complications: Secondary | ICD-10-CM

## 2019-10-07 ENCOUNTER — Ambulatory Visit: Payer: BC Managed Care – PPO | Admitting: Family Medicine

## 2019-10-07 ENCOUNTER — Other Ambulatory Visit: Payer: Self-pay

## 2019-10-07 VITALS — BP 129/70 | HR 54 | Temp 97.7°F | Ht 64.0 in | Wt 193.0 lb

## 2019-10-07 DIAGNOSIS — E1169 Type 2 diabetes mellitus with other specified complication: Secondary | ICD-10-CM

## 2019-10-07 DIAGNOSIS — E039 Hypothyroidism, unspecified: Secondary | ICD-10-CM

## 2019-10-07 DIAGNOSIS — E669 Obesity, unspecified: Secondary | ICD-10-CM | POA: Diagnosis not present

## 2019-10-07 DIAGNOSIS — I10 Essential (primary) hypertension: Secondary | ICD-10-CM

## 2019-10-07 DIAGNOSIS — E1159 Type 2 diabetes mellitus with other circulatory complications: Secondary | ICD-10-CM

## 2019-10-07 DIAGNOSIS — E785 Hyperlipidemia, unspecified: Secondary | ICD-10-CM

## 2019-10-07 DIAGNOSIS — I152 Hypertension secondary to endocrine disorders: Secondary | ICD-10-CM

## 2019-10-07 LAB — BAYER DCA HB A1C WAIVED: HB A1C (BAYER DCA - WAIVED): 6.6 % (ref <7.0)

## 2019-10-07 MED ORDER — AMLODIPINE-OLMESARTAN 10-40 MG PO TABS: 1.0000 | ORAL_TABLET | Freq: Every day | ORAL | 3 refills | Status: DC

## 2019-10-07 MED ORDER — LEVOTHYROXINE SODIUM 100 MCG PO TABS: ORAL_TABLET | ORAL | 3 refills | Status: DC

## 2019-10-07 MED ORDER — METFORMIN HCL 500 MG PO TABS: 500.0000 mg | ORAL_TABLET | Freq: Two times a day (BID) | ORAL | 3 refills | Status: DC

## 2019-10-07 NOTE — Patient Instructions (Signed)
Sugar is up a little bit since last visit, A1c 6.6.  Still CONTROLLED diabetes.  See me back in 67m unless you need me sooner.  Have a safe trip to [place] this weekend!  Happy 4th!

## 2019-10-07 NOTE — Progress Notes (Signed)
Subjective: CC: DM2 PCP: Janora Norlander, DO QIW:LNLGX Lisa Stephens is a 64 y.o. female presenting to clinic today for:  1. 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  She takes 1 tablet of Lipitor 2013m days/week.  She overall has been doing well.  She continues to maintain a lower carb diet.  Last eye exam: Up-to-date, goes to my eye doctor Last foot exam: Needs Last A1c:  Lab Results  Component Value Date   HGBA1C 6.2 04/06/2019   Nephropathy screen indicated?:  On ARB Last flu, zoster and/or pneumovax:  Immunization History  Administered Date(s) Administered  . Influenza Whole 01/23/2010, 01/27/2012, 01/25/2013, 02/02/2014, 01/18/2015  . Influenza,inj,Quad PF,6+ Mos 12/26/2016  . Influenza-Unspecified 01/06/2015, 01/15/2016, 12/29/2017, 01/19/2019  . Pneumococcal Polysaccharide-23 10/16/2014  . Tdap 01/25/2013  . Zoster 01/15/2016    ROS: denies chest pain, shortness of breath, sensation changes in the feet, change in vision.  2.  Hypothyroidism Longstanding history of hypothyroidism.  No history of surgery or radiation to the neck.    She reports compliance with Synthroid 100 mcg daily.  No change in voice but does note that she was having some difficulty with food feeling like it was stuck in her throat.  She consulted the nurse at work and was told to start omeprazole.  This has since resolved this issue.  No heart palpitations, tremor, change in bowel habits.  ROS: Per HPI  Allergies  Allergen Reactions  . Voltaren [Diclofenac Sodium] Shortness Of Breath and Other (See Comments)    Wheezing, coughing, runny nose   Past Medical History:  Diagnosis Date  . Arthritis   . Blood transfusion without reported diagnosis   . Cataract    small beginning  . Depression   . Diabetes [place] ([place] . Hyperlipidemia   . Hypertension   . Hypothyroidism     Current Outpatient Medications:    .  amLODipine-olmesartan (AZOR) 10-40 MG tablet, Take 1 tablet by mouth daily., Disp: 90 tablet, Rfl: 0 .  aspirin EC 81 MG tablet, Take 81 mg by mouth daily., Disp: , Rfl:  .  atorvastatin (LIPITOR) 20 MG tablet, Take 1 tablet 3 days per week for cholesterol., Disp: 90 tablet, Rfl: 1 .  Coenzyme Q10 (CO Q 10 PO), Take by mouth., Disp: , Rfl:  .  levothyroxine (SYNTHROID) 100 MCG tablet, TAKE 1 TABLET BY MOUTH  DAILY BEFORE BREAKFAST, Disp: 90 tablet, Rfl: 2 .  metFORMIN (GLUCOPHAGE) 500 MG tablet, Take 1 tablet (500 mg total) by mouth 2 (two) times daily with a meal., Disp: 180 tablet, Rfl: 3 .  Omega-3 Fatty Acids (FISH OIL) 1000 MG CAPS, Take 1,000 mg by mouth daily., Disp: , Rfl:  .  VITAMIN D PO, Take by mouth., Disp: , Rfl:  Social History   Socioeconomic History  . Marital status: Single    Spouse name: Not on file  . Number of children: Not on file  . Years of education: Not on file  . Highest education level: Not on file  Occupational History  . Not on file  Tobacco Use  . Smoking status: Never Smoker  . Smokeless tobacco: Never Used  Vaping Use  . Vaping Use: Never used  Substance and Sexual Activity  . Alcohol use: No  . Drug use: No  . Sexual activity: Not on file  Other Topics Concern  . Not on file  Social History Narrative  .  Not on file   Social Determinants of Health   Financial Resource Strain:   . Difficulty of Paying Living Expenses:   Food Insecurity:   . Worried About Charity fundraiser in the Last Year:   . Arboriculturist in the Last Year:   Transportation Needs:   . Film/video editor (Medical):   Marland Kitchen Lack of Transportation (Non-Medical):   Physical Activity:   . Days of Exercise per Week:   . Minutes of Exercise per Session:   Stress:   . Feeling of Stress :   Social Connections:   . Frequency of Communication with Friends and Family:   . Frequency of Social Gatherings with Friends and Family:   . Attends Religious Services:   . Active  Member of Clubs or Organizations:   . Attends Archivist Meetings:   Marland Kitchen Marital Status:   Intimate Partner Violence:   . Fear of Current or Ex-Partner:   . Emotionally Abused:   Marland Kitchen Physically Abused:   . Sexually Abused:    Family History  Problem Relation Age of Onset  . Diabetes Mother   . Hypertension Mother   . Diabetes Sister   . Hypertension Sister   . Colon polyps Sister   . Diabetes Brother   . Early death Son   . Diabetes Maternal Aunt   . Colon cancer Maternal Aunt   . Diabetes Maternal Uncle   . Esophageal cancer Neg Hx   . Rectal cancer Neg Hx   . Stomach cancer Neg Hx     Objective: Office vital signs reviewed. BP 129/70   Pulse (!) 54   Temp 97.7 F (36.5 C) (Temporal)   Ht 5' 4"  (1.626 m)   Wt 193 lb (87.5 kg)   SpO2 97%   BMI 33.13 kg/m   Physical Examination:  General: Awake, alert, well nourished, No acute distress HEENT: Normal; no exophthalmos.  No goiter.  No carotid bruits Cardio: regular rate and rhythm, S1S2 heard, no murmurs appreciated Pulm: clear to auscultation bilaterally, no wheezes, rhonchi or rales; normal work of breathing on room air Extremities: warm, well perfused, No edema, cyanosis or clubbing; +2 pulses bilaterally Skin: Normal temperature Neuro: No resting tremor  Diabetic Foot Exam - Simple   Simple Foot Form Diabetic Foot exam was performed with the following findings: Yes 10/07/2019  9:06 AM  Visual Inspection No deformities, no ulcerations, no other skin breakdown bilaterally: Yes Sensation Testing Intact to touch and monofilament testing bilaterally: Yes Pulse Check Posterior Tibialis and Dorsalis pulse intact bilaterally: Yes Comments Mild reduction in vibratory sensation but the remainder of the exam was normal.  She does have right-sided foot drop     Assessment/ Plan: 64 y.o. female   1. Diabetes mellitus type 2 in obese (HCC) Slight rise in A1c but still remains controlled at 6.6 today.  Continue  current regimen.  Follow-up in 6 months, sooner if needed.  Diabetic foot exam performed today.  Release of information form completed for my eye doctor - Bayer DCA Hb A1c Waived - CMP14+EGFR - metFORMIN (GLUCOPHAGE) 500 MG tablet; Take 1 tablet (500 mg total) by mouth 2 (two) times daily with a meal.  Dispense: 180 tablet; Refill: 3  2. Hyperlipidemia associated with type 2 diabetes mellitus ([place]) Continue statin - CMP14+EGFR - Lipid Panel  3. Hypertension associated with diabetes ([place]) Controlled.  Continue current regimen - CMP14+EGFR - amLODipine-olmesartan (AZOR) 10-40 MG tablet; Take 1 tablet by mouth daily.  Dispense: 90 tablet; Refill: 3  4. Acquired hypothyroidism We discussed that if the dysphagia recurs despite use of PPI, I would like to see her back in office. - Thyroid Panel With TSH - levothyroxine (SYNTHROID) 100 MCG tablet; TAKE 1 TABLET BY MOUTH  DAILY BEFORE BREAKFAST  Dispense: 90 tablet; Refill: 3    Orders Placed This Encounter  Procedures  . Bayer DCA Hb A1c Waived  . Thyroid Panel With TSH  . CMP14+EGFR  . Lipid Panel   Meds ordered this encounter  Medications  . metFORMIN (GLUCOPHAGE) 500 MG tablet    Sig: Take 1 tablet (500 mg total) by mouth 2 (two) times daily with a meal.    Dispense:  180 tablet    Refill:  3  . levothyroxine (SYNTHROID) 100 MCG tablet    Sig: TAKE 1 TABLET BY MOUTH  DAILY BEFORE BREAKFAST    Dispense:  90 tablet    Refill:  3  . amLODipine-olmesartan (AZOR) 10-40 MG tablet    Sig: Take 1 tablet by mouth daily.    Dispense:  90 tablet    Refill:  3    Requesting 1 year supply     Janora Norlander, [place] [phone]

## 2019-10-08 LAB — CMP14+EGFR
ALT: 25 IU/L (ref 0–32)
AST: 18 IU/L (ref 0–40)
Albumin/Globulin Ratio: 1.6 (ref 1.2–2.2)
Albumin: 4.2 g/dL (ref 3.8–4.8)
Alkaline Phosphatase: 125 IU/L — ABNORMAL HIGH (ref 48–121)
BUN/Creatinine Ratio: 16 (ref 12–28)
BUN: 10 mg/dL (ref 8–27)
Bilirubin Total: 0.4 mg/dL (ref 0.0–1.2)
CO2: 22 mmol/L (ref 20–29)
Calcium: 9.2 mg/dL (ref 8.7–10.3)
Chloride: 101 mmol/L (ref 96–106)
Creatinine, Ser: 0.61 mg/dL (ref 0.57–1.00)
GFR calc Af Amer: 112 mL/min/{1.73_m2} (ref >59)
GFR calc non Af Amer: 97 mL/min/{1.73_m2} (ref >59)
Globulin, Total: 2.6 g/dL (ref 1.5–4.5)
Glucose: 123 mg/dL — ABNORMAL HIGH (ref 65–99)
Potassium: 4.5 mmol/L (ref 3.5–5.2)
Sodium: 139 mmol/L (ref 134–144)
Total Protein: 6.8 g/dL (ref 6.0–8.5)

## 2019-10-08 LAB — THYROID PANEL WITH TSH
Free Thyroxine Index: 2.5 (ref 1.2–4.9)
T3 Uptake Ratio: 27 % (ref 24–39)
T4, Total: 9.1 ug/dL (ref 4.5–12.0)
TSH: 1.64 u[IU]/mL (ref 0.450–4.500)

## 2019-10-08 LAB — LIPID PANEL
Chol/HDL Ratio: 3.3 ratio (ref 0.0–4.4)
Cholesterol, Total: 171 mg/dL (ref 100–199)
HDL: 52 mg/dL (ref >39)
LDL Chol Calc (NIH): 100 mg/dL — ABNORMAL HIGH (ref 0–99)
Triglycerides: 103 mg/dL (ref 0–149)
VLDL Cholesterol Cal: 19 mg/dL (ref 5–40)

## 2019-10-11 ENCOUNTER — Telehealth: Payer: Self-pay | Admitting: Family Medicine

## 2019-10-12 ENCOUNTER — Other Ambulatory Visit: Payer: Self-pay

## 2019-10-12 DIAGNOSIS — N6320 Unspecified lump in the left breast, unspecified quadrant: Secondary | ICD-10-CM

## 2019-10-12 DIAGNOSIS — R928 Other abnormal and inconclusive findings on diagnostic imaging of breast: Secondary | ICD-10-CM

## 2019-10-12 NOTE — Progress Notes (Signed)
Placed orders for 3 month follow up breast imaging - called patient left message to call the office

## 2019-10-12 NOTE — Telephone Encounter (Signed)
Orders placed called and left message for patient

## 2019-10-14 ENCOUNTER — Telehealth: Payer: Self-pay | Admitting: Family Medicine

## 2019-10-14 DIAGNOSIS — E039 Hypothyroidism, unspecified: Secondary | ICD-10-CM

## 2019-10-14 MED ORDER — LEVOTHYROXINE SODIUM 100 MCG PO TABS: ORAL_TABLET | ORAL | 0 refills | Status: DC

## 2019-10-14 NOTE — Telephone Encounter (Signed)
Pt states [facility] had sent her a message saying they were waiting on a response on clarifying her metformin prior to shipping it out. Advised pt I faxed the clarification form yesterday afternoon around 5. Pt also requested a short supply of her thyroid med to be sent to local pharmacy so she doesn't run out. rx [facility] as requested.

## 2019-10-14 NOTE — Telephone Encounter (Signed)
°  Prescription Request  10/14/2019  What is the name of the medication or equipment? Levothyroxine & Metformin  Have you contacted your pharmacy to request a refill? (if applicable) Yes  Which pharmacy would you like this sent to? [facility]   Patient notified that their request is being sent to the clinical staff for review and that they should receive a response within 2 business days.   [facility] pt.  Levothyroxine--Only 3 pills left (Can you send into [name] until she gets her mailorder)  She got a text from [facility] that they need more info. From [place] before refilling Meds.  Pt was seen last week.  Please call pt.

## 2019-11-22 ENCOUNTER — Other Ambulatory Visit (HOSPITAL_COMMUNITY): Payer: BC Managed Care – PPO

## 2019-11-22 ENCOUNTER — Encounter (HOSPITAL_COMMUNITY): Payer: BC Managed Care – PPO

## 2019-11-24 ENCOUNTER — Encounter (HOSPITAL_COMMUNITY): Payer: BC Managed Care – PPO

## 2019-11-24 ENCOUNTER — Other Ambulatory Visit (HOSPITAL_COMMUNITY): Payer: BC Managed Care – PPO

## 2019-12-06 ENCOUNTER — Other Ambulatory Visit: Payer: Self-pay

## 2019-12-06 ENCOUNTER — Ambulatory Visit (HOSPITAL_COMMUNITY)
Admission: RE | Admit: 2019-12-06 | Discharge: 2019-12-06 | Disposition: A | Discharge status: Home / Self Care | Payer: BC Managed Care – PPO | Source: Ambulatory Visit | Attending: Family Medicine | Admitting: Family Medicine

## 2019-12-06 DIAGNOSIS — N6320 Unspecified lump in the left breast, unspecified quadrant: Secondary | ICD-10-CM | POA: Diagnosis not present

## 2019-12-06 DIAGNOSIS — R921 Mammographic calcification found on diagnostic imaging of breast: Secondary | ICD-10-CM | POA: Diagnosis not present

## 2019-12-06 DIAGNOSIS — R928 Other abnormal and inconclusive findings on diagnostic imaging of breast: Secondary | ICD-10-CM

## 2019-12-06 DIAGNOSIS — N6489 Other specified disorders of breast: Secondary | ICD-10-CM | POA: Diagnosis not present

## 2020-01-16 DIAGNOSIS — Z23 Encounter for immunization: Secondary | ICD-10-CM | POA: Diagnosis not present

## 2020-01-25 ENCOUNTER — Telehealth: Payer: Self-pay

## 2020-01-25 DIAGNOSIS — E039 Hypothyroidism, unspecified: Secondary | ICD-10-CM

## 2020-01-25 MED ORDER — LEVOTHYROXINE SODIUM 100 MCG PO TABS: ORAL_TABLET | ORAL | 0 refills | Status: DC

## 2020-01-25 NOTE — Telephone Encounter (Signed)
Medication sent - patient aware 

## 2020-04-04 ENCOUNTER — Other Ambulatory Visit: Payer: Self-pay

## 2020-04-04 ENCOUNTER — Ambulatory Visit: Payer: BC Managed Care – PPO | Admitting: Family Medicine

## 2020-04-04 ENCOUNTER — Encounter: Payer: Self-pay | Admitting: Family Medicine

## 2020-04-04 VITALS — BP 118/69 | HR 70 | Temp 97.7°F | Ht 64.0 in | Wt 198.0 lb

## 2020-04-04 DIAGNOSIS — I152 Hypertension secondary to endocrine disorders: Secondary | ICD-10-CM

## 2020-04-04 DIAGNOSIS — E785 Hyperlipidemia, unspecified: Secondary | ICD-10-CM

## 2020-04-04 DIAGNOSIS — E1159 Type 2 diabetes mellitus with other circulatory complications: Secondary | ICD-10-CM

## 2020-04-04 DIAGNOSIS — E039 Hypothyroidism, unspecified: Secondary | ICD-10-CM

## 2020-04-04 DIAGNOSIS — E669 Obesity, unspecified: Secondary | ICD-10-CM

## 2020-04-04 DIAGNOSIS — E1169 Type 2 diabetes mellitus with other specified complication: Secondary | ICD-10-CM

## 2020-04-04 DIAGNOSIS — M7581 Other shoulder lesions, right shoulder: Secondary | ICD-10-CM | POA: Diagnosis not present

## 2020-04-04 LAB — BAYER DCA HB A1C WAIVED: HB A1C (BAYER DCA - WAIVED): 6.8 % (ref <7.0)

## 2020-04-04 NOTE — Progress Notes (Signed)
Subjective: CC: DM PCP: Janora Norlander, DO OIN:OMVEH Lisa Stephens is a 64 y.o. female presenting to clinic today for:  1. Type 2 Diabetes with hypertension, hyperlipidemia:  Patient is compliant with her Metformin, Lipitor, Azor 10-40 mg daily.  No chest pain, shortness of breath, edema.  Last eye exam: Up-to-date Last foot exam: Up-to-date Last A1c:  Lab Results  Component Value Date   HGBA1C 6.6 10/07/2019   Nephropathy screen indicated?:  On ARB Last flu, zoster and/or pneumovax:  Immunization History  Administered Date(s) Administered  . Influenza Whole 01/23/2010, 01/27/2012, 01/25/2013, 02/02/2014, 01/18/2015  . Influenza,inj,Quad PF,6+ Mos 12/26/2016  . Influenza-Unspecified 01/06/2015, 01/15/2016, 12/29/2017, 01/19/2019, 01/16/2020  . PFIZER SARS-COV-2 Vaccination 06/21/2019, 07/12/2019, 01/17/2020  . Pneumococcal Polysaccharide-23 10/16/2014  . Tdap 01/25/2013  . Zoster 01/15/2016    2. Hypothyroidism Compliant with Synthroid.  No reports of heart palpitations, tremor  3.  Right shoulder pain Patient reports right shoulder pain, particularly with flexion and/or abduction.  She works in a [employer] and often does overhead movements with her right upper extremity.  Pain is intermittent and somewhat relieved by Motrin and Aspercreme.  No sensory changes, no weakness.  No preceding injury.  She is right-hand dominant.  ROS: Per HPI  Allergies  Allergen Reactions  . Voltaren [Diclofenac Sodium] Shortness Of Breath and Other (See Comments)    Wheezing, coughing, runny nose   Past Medical History:  Diagnosis Date  . Arthritis   . Blood transfusion without reported diagnosis   . Cataract    small beginning  . Depression   . Diabetes mellitus without complication ([place])   . Hyperlipidemia   . Hypertension   . Hypothyroidism     Current Outpatient Medications:  .  amLODipine-olmesartan (AZOR) 10-40 MG tablet, Take 1 tablet by mouth daily., Disp: 90 tablet, Rfl:  3 .  aspirin EC 81 MG tablet, Take 81 mg by mouth daily., Disp: , Rfl:  .  atorvastatin (LIPITOR) 20 MG tablet, Take 1 tablet 3 days per week for cholesterol., Disp: 90 tablet, Rfl: 1 .  Coenzyme Q10 (CO Q 10 PO), Take by mouth., Disp: , Rfl:  .  levothyroxine (SYNTHROID) 100 MCG tablet, TAKE 1 TABLET BY MOUTH  DAILY BEFORE BREAKFAST, Disp: 10 tablet, Rfl: 0 .  metFORMIN (GLUCOPHAGE) 500 MG tablet, Take 1 tablet (500 mg total) by mouth 2 (two) times daily with a meal., Disp: 180 tablet, Rfl: 3 .  Omega-3 Fatty Acids (FISH OIL) 1000 MG CAPS, Take 1,000 mg by mouth daily., Disp: , Rfl:  .  VITAMIN D PO, Take by mouth., Disp: , Rfl:  Social History   Socioeconomic History  . Marital status: Single    Spouse name: Not on file  . Number of children: Not on file  . Years of education: Not on file  . Highest education level: Not on file  Occupational History  . Not on file  Tobacco Use  . Smoking status: Never Smoker  . Smokeless tobacco: Never Used  Vaping Use  . Vaping Use: Never used  Substance and Sexual Activity  . Alcohol use: No  . Drug use: No  . Sexual activity: Not on file  Other Topics Concern  . Not on file  Social History Narrative  . Not on file   Social Determinants of Health   Financial Resource Strain: Not on file  Food Insecurity: Not on file  Transportation Needs: Not on file  Physical Activity: Not on file  Stress: Not on file  Social Connections: Not on file  Intimate Partner Violence: Not on file   Family History  Problem Relation Age of Onset  . Diabetes Mother   . Hypertension Mother   . Diabetes Sister   . Hypertension Sister   . Colon polyps Sister   . Diabetes Brother   . Early death Son   . Diabetes Maternal Aunt   . Colon cancer Maternal Aunt   . Diabetes Maternal Uncle   . Esophageal cancer Neg Hx   . Rectal cancer Neg Hx   . Stomach cancer Neg Hx     Objective: Office vital signs reviewed. BP 118/69   Pulse 70   Temp 97.7 F (36.5  C) (Temporal)   Ht 5' 4"  (1.626 m)   Wt 198 lb (89.8 kg)   SpO2 94%   BMI 33.99 kg/m   Physical Examination:  General: Awake, alert, No acute distress HEENT: Normal; sclera white Cardio: regular rate and rhythm, S1S2 heard, no murmurs appreciated Pulm: clear to auscultation bilaterally, no wheezes, rhonchi or rales; normal work of breathing on room air Extremities: warm, well perfused, No edema, cyanosis or clubbing; +2 pulses bilaterally MSK  Right shoulder: She has full active range of motion in all planes.  This is painless active range of motion.  No tenderness palpation to the rotator cuff or shoulder joint.  She has mild discomfort with empty can testing.  Negative [place]. Skin: dry; intact; no rashes or lesions Neuro: 5/5 UE Strength and light touch sensation grossly intact   Assessment/ Plan: 64 y.o. female   Diabetes mellitus type 2 in obese ([place]) - Plan: Bayer DCA Hb A1c Waived  Hyperlipidemia associated with type 2 diabetes mellitus ([place]) - Plan: CMP14+EGFR, Lipid Panel  Hypertension associated with diabetes ([place]) - Plan: CMP14+EGFR  Acquired hypothyroidism - Plan: Thyroid Panel With TSH  Rotator cuff tendinitis, right  Sugars well controlled at 6.8 today.  Continue current regimen.  Fasting labs are ordered.  Continue statin  Blood pressure well controlled.  Continue current regimen  She is asymptomatic from a thyroid standpoint.  Check thyroid panel  Rotator cuff is likely a tendinitis rather than tear.  We discussed topical NSAID, home physical therapy packet was provided.  I consider topical Voltaren gel but she has had history of shortness of breath with oral so I recommended Aleve gel instead.  We discussed that if symptoms worsen or do not improve we can consider corticosteroid injection.  She is aware of recommendations will follow up as needed  No orders of the defined types were placed in this encounter.  No orders of the defined types were placed in  this encounter.    Janora Norlander, DO [place] [phone]

## 2020-04-04 NOTE — Patient Instructions (Signed)
Get Aleve gel over the counter and apply to the right shoulder up to daily if needed for pain  Do the home physical therapy stretches I've given you.  If it gets worse, let me know.  We can do a shot in that shoulder if needed  You had labs performed today.  You will be contacted with the results of the labs once they are available, usually in the next 3 business days for routine lab work.  If you have an active my chart account, they will be released to your MyChart.  If you prefer to have these labs released to you via telephone, please let us know.  If you had a pap smear or biopsy performed, expect to be contacted in about 7-10 days.

## 2020-04-05 LAB — CMP14+EGFR
ALT: 30 IU/L (ref 0–32)
AST: 25 IU/L (ref 0–40)
Albumin/Globulin Ratio: 1.5 (ref 1.2–2.2)
Albumin: 4.1 g/dL (ref 3.8–4.8)
Alkaline Phosphatase: 120 IU/L (ref 44–121)
BUN/Creatinine Ratio: 13 (ref 12–28)
BUN: 9 mg/dL (ref 8–27)
Bilirubin Total: 0.4 mg/dL (ref 0.0–1.2)
CO2: 24 mmol/L (ref 20–29)
Calcium: 9.5 mg/dL (ref 8.7–10.3)
Chloride: 103 mmol/L (ref 96–106)
Creatinine, Ser: 0.69 mg/dL (ref 0.57–1.00)
GFR calc Af Amer: 107 mL/min/{1.73_m2} (ref >59)
GFR calc non Af Amer: 93 mL/min/{1.73_m2} (ref >59)
Globulin, Total: 2.7 g/dL (ref 1.5–4.5)
Glucose: 129 mg/dL — ABNORMAL HIGH (ref 65–99)
Potassium: 4.4 mmol/L (ref 3.5–5.2)
Sodium: 140 mmol/L (ref 134–144)
Total Protein: 6.8 g/dL (ref 6.0–8.5)

## 2020-04-05 LAB — THYROID PANEL WITH TSH
Free Thyroxine Index: 2.4 (ref 1.2–4.9)
T3 Uptake Ratio: 25 % (ref 24–39)
T4, Total: 9.6 ug/dL (ref 4.5–12.0)
TSH: 1.48 u[IU]/mL (ref 0.450–4.500)

## 2020-04-05 LAB — LIPID PANEL
Chol/HDL Ratio: 4 ratio (ref 0.0–4.4)
Cholesterol, Total: 212 mg/dL — ABNORMAL HIGH (ref 100–199)
HDL: 53 mg/dL (ref >39)
LDL Chol Calc (NIH): 136 mg/dL — ABNORMAL HIGH (ref 0–99)
Triglycerides: 129 mg/dL (ref 0–149)
VLDL Cholesterol Cal: 23 mg/dL (ref 5–40)

## 2020-04-09 ENCOUNTER — Other Ambulatory Visit: Payer: Self-pay | Admitting: Family Medicine

## 2020-04-09 DIAGNOSIS — E039 Hypothyroidism, unspecified: Secondary | ICD-10-CM

## 2020-04-09 MED ORDER — LEVOTHYROXINE SODIUM 100 MCG PO TABS: ORAL_TABLET | ORAL | 3 refills | Status: DC

## 2020-04-13 ENCOUNTER — Encounter: Payer: Self-pay | Admitting: *Deleted

## 2020-05-22 ENCOUNTER — Encounter: Payer: Self-pay | Admitting: *Deleted

## 2020-08-24 ENCOUNTER — Other Ambulatory Visit: Payer: Self-pay | Admitting: Family Medicine

## 2020-08-24 DIAGNOSIS — I152 Hypertension secondary to endocrine disorders: Secondary | ICD-10-CM

## 2020-09-24 ENCOUNTER — Encounter: Payer: BC Managed Care – PPO | Admitting: Family Medicine

## 2020-09-27 ENCOUNTER — Ambulatory Visit (INDEPENDENT_AMBULATORY_CARE_PROVIDER_SITE_OTHER): Payer: BC Managed Care – PPO | Admitting: Family Medicine

## 2020-09-27 ENCOUNTER — Encounter: Payer: Self-pay | Admitting: Family Medicine

## 2020-09-27 ENCOUNTER — Other Ambulatory Visit: Payer: Self-pay

## 2020-09-27 VITALS — BP 136/72 | HR 56 | Temp 97.0°F | Ht 64.0 in | Wt 194.0 lb

## 2020-09-27 DIAGNOSIS — E559 Vitamin D deficiency, unspecified: Secondary | ICD-10-CM | POA: Diagnosis not present

## 2020-09-27 DIAGNOSIS — Z0000 Encounter for general adult medical examination without abnormal findings: Secondary | ICD-10-CM

## 2020-09-27 DIAGNOSIS — I152 Hypertension secondary to endocrine disorders: Secondary | ICD-10-CM

## 2020-09-27 DIAGNOSIS — Z0001 Encounter for general adult medical examination with abnormal findings: Secondary | ICD-10-CM

## 2020-09-27 DIAGNOSIS — E1159 Type 2 diabetes mellitus with other circulatory complications: Secondary | ICD-10-CM | POA: Diagnosis not present

## 2020-09-27 DIAGNOSIS — E669 Obesity, unspecified: Secondary | ICD-10-CM

## 2020-09-27 DIAGNOSIS — E039 Hypothyroidism, unspecified: Secondary | ICD-10-CM

## 2020-09-27 DIAGNOSIS — E1169 Type 2 diabetes mellitus with other specified complication: Secondary | ICD-10-CM

## 2020-09-27 DIAGNOSIS — H8110 Benign paroxysmal vertigo, unspecified ear: Secondary | ICD-10-CM

## 2020-09-27 DIAGNOSIS — E785 Hyperlipidemia, unspecified: Secondary | ICD-10-CM | POA: Diagnosis not present

## 2020-09-27 LAB — BAYER DCA HB A1C WAIVED: HB A1C (BAYER DCA - WAIVED): 6.6 % (ref <7.0)

## 2020-09-27 MED ORDER — ATORVASTATIN CALCIUM 20 MG PO TABS: ORAL_TABLET | ORAL | 3 refills | Status: DC

## 2020-09-27 MED ORDER — METFORMIN HCL 500 MG PO TABS: 500.0000 mg | ORAL_TABLET | Freq: Two times a day (BID) | ORAL | 3 refills | Status: DC

## 2020-09-27 NOTE — Patient Instructions (Signed)
You had labs performed today.  You will be contacted with the results of the labs once they are available, usually in the next 3 business days for routine lab work.  If you have an active my chart account, they will be released to your MyChart.  If you prefer to have these labs released to you via telephone, please let us know.  I have given you the Eppley maneuver for when you get vertigo again.  If you decide you want to see the physical therapist or the ear nose and throat doctor, call me.  Sugar looks great.  Keep up the good work!!  Preventive Care 32-64 Years Old, Female Preventive care refers to lifestyle choices and visits with your health care provider that can promote health and wellness. This includes: A yearly physical exam. This is also called an annual wellness visit. Regular dental and eye exams. Immunizations. Screening for certain conditions. Healthy lifestyle choices, such as: Eating a healthy diet. Getting regular exercise. Not using drugs or products that contain nicotine and tobacco. Limiting alcohol use. What can I expect for my preventive care visit? Physical exam Your health care provider will check your: Height and weight. These may be used to calculate your BMI (body mass index). BMI is a measurement that tells if you are at a healthy weight. Heart rate and blood pressure. Body temperature. Skin for abnormal spots. Counseling Your health care provider may ask you questions about your: Past medical problems. Family's medical history. Alcohol, tobacco, and drug use. Emotional well-being. Home life and relationship well-being. Sexual activity. Diet, exercise, and sleep habits. Work and work Statistician. Access to firearms. Method of birth control. Menstrual cycle. Pregnancy history. What immunizations do I need?  Vaccines are usually given at various ages, according to a schedule. Your health care provider will recommend vaccines for you based on your  age, medicalhistory, and lifestyle or other factors, such as travel or where you work. What tests do I need? Blood tests Lipid and cholesterol levels. These may be checked every 5 years, or more often if you are over 17 years old. Hepatitis C test. Hepatitis B test. Screening Lung cancer screening. You may have this screening every year starting at age 72 if you have a 30-pack-year history of smoking and currently smoke or have quit within the past 15 years. Colorectal cancer screening. All adults should have this screening starting at age 23 and continuing until age 77. Your health care provider may recommend screening at age 45 if you are at increased risk. You will have tests every 1-10 years, depending on your results and the type of screening test. Diabetes screening. This is done by checking your blood sugar (glucose) after you have not eaten for a while (fasting). You may have this done every 1-3 years. Mammogram. This may be done every 1-2 years. Talk with your health care provider about when you should start having regular mammograms. This may depend on whether you have a family history of breast cancer. BRCA-related cancer screening. This may be done if you have a family history of breast, ovarian, tubal, or peritoneal cancers. Pelvic exam and Pap test. This may be done every 3 years starting at age 31. Starting at age 66, this may be done every 5 years if you have a Pap test in combination with an HPV test. Other tests STD (sexually transmitted disease) testing, if you are at risk. Bone density scan. This is done to screen for osteoporosis. You may have  this scan if you are at high risk for osteoporosis. Talk with your health care provider about your test results, treatment options,and if necessary, the need for more tests. Follow these instructions at home: Eating and drinking  Eat a diet that includes fresh fruits and vegetables, whole grains, lean protein, and low-fat dairy  products. Take vitamin and mineral supplements as recommended by your health care provider. Do not drink alcohol if: Your health care provider tells you not to drink. You are pregnant, may be pregnant, or are planning to become pregnant. If you drink alcohol: Limit how much you have to 0-1 drink a day. Be aware of how much alcohol is in your drink. In the [place] one drink equals one 12 oz bottle of beer (355 mL), one 5 oz glass of wine (148 mL), or one 1 oz glass of hard liquor (44 mL).  Lifestyle Take daily care of your teeth and gums. Brush your teeth every morning and night with fluoride toothpaste. Floss one time each day. Stay active. Exercise for at least 30 minutes 5 or more days each week. Do not use any products that contain nicotine or tobacco, such as cigarettes, e-cigarettes, and chewing tobacco. If you need help quitting, ask your health care provider. Do not use drugs. If you are sexually active, practice safe sex. Use a condom or other form of protection to prevent STIs (sexually transmitted infections). If you do not wish to become pregnant, use a form of birth control. If you plan to become pregnant, see your health care provider for a prepregnancy visit. If told by your health care provider, take low-dose aspirin daily starting at age 46. Find healthy ways to cope with stress, such as: Meditation, yoga, or listening to music. Journaling. Talking to a trusted person. Spending time with friends and family. Safety Always wear your seat belt while driving or riding in a vehicle. Do not drive: If you have been drinking alcohol. Do not ride with someone who has been drinking. When you are tired or distracted. While texting. Wear a helmet and other protective equipment during sports activities. If you have firearms in your house, make sure you follow all gun safety procedures. What's next? Visit your health care provider once a year for an annual wellness visit. Ask your  health care provider how often you should have your eyes and teeth checked. Stay up to date on all vaccines. This information is not intended to replace advice given to you by your health care provider. Make sure you discuss any questions you have with your healthcare provider. Document Revised: 12/27/2019 Document Reviewed: 12/03/2017 Elsevier Patient Education  2022 [employer].

## 2020-09-27 NOTE — Progress Notes (Signed)
Lisa Stephens is a 65 y.o. female presents to office today for annual physical exam examination.    Concerns today include: 1. Type 2 Diabetes with hypertension, hyperlipidemia:  She continues to stay physically active.  She reports good urine output.  She is compliant with her metformin 1 tablet twice daily, Lipitor 1 tablet 3 days/week and Azor 10-40 mg daily.  No chest pain, shortness of breath, edema.  She does report she occasionally gets vertigo with last major episode back in March that actually caused her to be physically ill.  She was previously given the Epley maneuver but could not find the packet and is asking for a new one today.  Last eye exam: Up-to-date.  Sees my eye doctor Last foot exam: Up-to-date Last A1c:  Lab Results  Component Value Date   HGBA1C 6.8 04/04/2020   Nephropathy screen indicated?:  On ARB Last flu, zoster and/or pneumovax:  Immunization History  Administered Date(s) Administered   Influenza Whole 01/23/2010, 01/27/2012, 01/25/2013, 02/02/2014, 01/18/2015   Influenza,inj,Quad PF,6+ Mos 12/26/2016   Influenza-Unspecified 01/06/2015, 01/15/2016, 12/29/2017, 01/19/2019, 01/16/2020   PFIZER(Purple Top)SARS-COV-2 Vaccination 06/21/2019, 07/12/2019, 01/17/2020   Pneumococcal Polysaccharide-23 10/16/2014   Tdap 01/25/2013   Zoster, Live 01/15/2016   2.  Hypothyroidism Patient is compliant with her Synthroid 100 mcg daily.  No change in voice, difficulty swallowing, tremor, heart palpitations, change in bowel habits  Marital status: married, Substance use: none Diet: balanced, Exercise: exercises regularly Last eye exam: UTD Last dental exam: UTD Last colonoscopy: UTD Last mammogram: UTD, repeat Aug Last pap smear: UTD Refills needed today: all Immunizations needed:  Immunization History  Administered Date(s) Administered   Influenza Whole 01/23/2010, 01/27/2012, 01/25/2013, 02/02/2014, 01/18/2015   Influenza,inj,Quad PF,6+ Mos 12/26/2016    Influenza-Unspecified 01/06/2015, 01/15/2016, 12/29/2017, 01/19/2019, 01/16/2020   PFIZER(Purple Top)SARS-COV-2 Vaccination 06/21/2019, 07/12/2019, 01/17/2020   Pneumococcal Polysaccharide-23 10/16/2014   Tdap 01/25/2013   Zoster, Live 01/15/2016    Past Medical History:  Diagnosis Date   Arthritis    Blood transfusion without reported diagnosis    Cataract    small beginning   Depression    Diabetes mellitus without complication ([place])    Hyperlipidemia    Hypertension    Hypothyroidism    Social History   Socioeconomic History   Marital status: Single    Spouse name: Not on file   Number of children: Not on file   Years of education: Not on file   Highest education level: Not on file  Occupational History   Not on file  Tobacco Use   Smoking status: Never   Smokeless tobacco: Never  Vaping Use   Vaping Use: Never used  Substance and Sexual Activity   Alcohol use: No   Drug use: No   Sexual activity: Not on file  Other Topics Concern   Not on file  Social History Narrative   Not on file   Social Determinants of Health   Financial Resource Strain: Not on file  Food Insecurity: Not on file  Transportation Needs: Not on file  Physical Activity: Not on file  Stress: Not on file  Social Connections: Not on file  Intimate Partner Violence: Not on file   Past Surgical History:  Procedure Laterality Date   ABDOMINAL HYSTERECTOMY     MAXIMUM ACCESS (MAS)POSTERIOR LUMBAR INTERBODY FUSION (PLIF) 2 LEVEL N/A 07/02/2015   Procedure: L3-4 L4-5 Maximum access posterior lumbar interbody fusion;  Surgeon: Kevan Ny Ditty, MD;  Location: [facility];  Service: Neurosurgery;  Laterality: N/A;  L3-4 L4-5 Maximum access posterior lumbar interbody fusion   TUBAL LIGATION     Family History  Problem Relation Age of Onset   Diabetes Mother    Hypertension Mother    Diabetes Sister    Hypertension Sister    Colon polyps Sister    Diabetes Brother    Early death Son     Diabetes Maternal Aunt    Colon cancer Maternal Aunt    Diabetes Maternal Uncle    Esophageal cancer Neg Hx    Rectal cancer Neg Hx    Stomach cancer Neg Hx     Current Outpatient Medications:    amLODipine-olmesartan (AZOR) 10-40 MG tablet, TAKE 1 TABLET BY MOUTH  DAILY, Disp: 90 tablet, Rfl: 0   atorvastatin (LIPITOR) 20 MG tablet, Take 1 tablet 3 days per week for cholesterol., Disp: 90 tablet, Rfl: 1   Blood Glucose Monitoring Suppl (CONTOUR NEXT MONITOR) w/Device KIT, , Disp: , Rfl:    Coenzyme Q10 (CO Q 10 PO), Take by mouth., Disp: , Rfl:    CONTOUR NEXT TEST test strip, , Disp: , Rfl:    Lancets (ONETOUCH ULTRASOFT) lancets, 1 Device by Misc.(Non-Drug; Combo Route) route daily., Disp: , Rfl:    levothyroxine (SYNTHROID) 100 MCG tablet, TAKE 1 TABLET BY MOUTH  DAILY BEFORE BREAKFAST, Disp: 90 tablet, Rfl: 3   metFORMIN (GLUCOPHAGE) 500 MG tablet, Take 1 tablet (500 mg total) by mouth 2 (two) times daily with a meal., Disp: 180 tablet, Rfl: 3   Omega-3 Fatty Acids (FISH OIL) 1000 MG CAPS, Take 1,000 mg by mouth daily., Disp: , Rfl:    VITAMIN D PO, Take by mouth., Disp: , Rfl:    aspirin EC 81 MG tablet, Take 81 mg by mouth daily., Disp: , Rfl:   Allergies  Allergen Reactions   Voltaren [Diclofenac Sodium] Shortness Of Breath and Other (See Comments)    Wheezing, coughing, runny nose     ROS: Review of Systems Pertinent items noted in HPI and remainder of comprehensive ROS otherwise negative.    Physical exam BP 136/72   Pulse (!) 56   Temp (!) 97 F (36.1 C)   Ht 5' 4"  (1.626 m)   Wt 194 lb (88 kg)   SpO2 97%   BMI 33.30 kg/m  General appearance: alert, cooperative, appears stated age, and no distress Head: Normocephalic, without obvious abnormality, atraumatic Eyes: negative findings: lids and lashes normal, conjunctivae and sclerae normal, corneas clear, and pupils equal, round, reactive to light and accomodation Ears: normal TM's and external ear canals both  ears Nose: Nares normal. Septum midline. Mucosa normal. No drainage or sinus tenderness. Throat:  Dental caps noted.  Oropharynx without masses.  Moist mucous membranes Neck: no adenopathy, supple, symmetrical, trachea midline, and thyroid not enlarged, symmetric, no tenderness/mass/nodules Back: symmetric, no curvature. ROM normal. No CVA tenderness. Lungs: clear to auscultation bilaterally Heart: regular rate and rhythm, S1, S2 normal, no murmur, click, rub or gallop Abdomen: Soft, nontender, nondistended Extremities: extremities normal, atraumatic, no cyanosis or edema Pulses: 2+ and symmetric Skin: Skin color, texture, turgor normal. No rashes or lesions Lymph nodes: Cervical, supraclavicular, and axillary nodes normal. Neurologic: Grossly normal   Assessment/ Plan: Lisa Stephens here for annual physical exam.   Annual physical exam  Diabetes mellitus type 2 in obese ([place]) - Plan: Bayer DCA Hb A1c Waived, EKG 12-Lead, metFORMIN (GLUCOPHAGE) 500 MG tablet  Hyperlipidemia associated with type 2 diabetes mellitus ([place]) - Plan: CMP14+EGFR,  Lipid Panel, EKG 12-Lead, atorvastatin (LIPITOR) 20 MG tablet  Hypertension associated with diabetes ([place]) - Plan: CMP14+EGFR, EKG 12-Lead  Acquired hypothyroidism - Plan: TSH, T4, Free  Vitamin D deficiency - Plan: VITAMIN D 25 Hydroxy (Vit-D Deficiency, Fractures)  Benign paroxysmal positional vertigo, unspecified laterality  Sugar under excellent control.  We will obtain her eye exam from my eye doctor.  We checked an EKG today which was relatively unchanged from previous except for slightly reduced heart rate.  Fasting lipid panel and CMP obtained  Blood pressure under excellent control.  Continue current regimen  Check TSH, free T4  Her symptoms sound like BPPV.  I given her handout on Epley maneuver and did offer referral to ENT versus vestibular rehab but she would like to hold off on this for now.  I encouraged her to contact me  anytime if she develops severe vertigo again.  Counseled on healthy lifestyle choices, including diet (rich in fruits, vegetables and lean meats and low in salt and simple carbohydrates) and exercise (at least 30 minutes of moderate physical activity daily).  Patient to follow up in 1 year for annual exam or sooner if needed.  Kenisha Lynds M. Lajuana Ripple, DO

## 2020-09-28 LAB — CMP14+EGFR
ALT: 29 IU/L (ref 0–32)
AST: 29 IU/L (ref 0–40)
Albumin/Globulin Ratio: 1.6 (ref 1.2–2.2)
Albumin: 4.6 g/dL (ref 3.8–4.8)
Alkaline Phosphatase: 128 IU/L — ABNORMAL HIGH (ref 44–121)
BUN/Creatinine Ratio: 15 (ref 12–28)
BUN: 11 mg/dL (ref 8–27)
Bilirubin Total: 0.6 mg/dL (ref 0.0–1.2)
CO2: 23 mmol/L (ref 20–29)
Calcium: 9.7 mg/dL (ref 8.7–10.3)
Chloride: 102 mmol/L (ref 96–106)
Creatinine, Ser: 0.74 mg/dL (ref 0.57–1.00)
Globulin, Total: 2.8 g/dL (ref 1.5–4.5)
Glucose: 122 mg/dL — ABNORMAL HIGH (ref 65–99)
Potassium: 4.2 mmol/L (ref 3.5–5.2)
Sodium: 142 mmol/L (ref 134–144)
Total Protein: 7.4 g/dL (ref 6.0–8.5)
eGFR: 90 mL/min/{1.73_m2} (ref >59)

## 2020-09-28 LAB — TSH: TSH: 1.49 u[IU]/mL (ref 0.450–4.500)

## 2020-09-28 LAB — LIPID PANEL
Chol/HDL Ratio: 3.4 ratio (ref 0.0–4.4)
Cholesterol, Total: 185 mg/dL (ref 100–199)
HDL: 54 mg/dL (ref >39)
LDL Chol Calc (NIH): 117 mg/dL — ABNORMAL HIGH (ref 0–99)
Triglycerides: 75 mg/dL (ref 0–149)
VLDL Cholesterol Cal: 14 mg/dL (ref 5–40)

## 2020-09-28 LAB — T4, FREE: Free T4: 1.59 ng/dL (ref 0.82–1.77)

## 2020-09-28 LAB — VITAMIN D 25 HYDROXY (VIT D DEFICIENCY, FRACTURES): Vit D, 25-Hydroxy: 37.7 ng/mL (ref 30.0–100.0)

## 2020-10-04 ENCOUNTER — Telehealth: Payer: Self-pay | Admitting: Family Medicine

## 2020-10-04 DIAGNOSIS — E039 Hypothyroidism, unspecified: Secondary | ICD-10-CM

## 2020-10-04 DIAGNOSIS — E1169 Type 2 diabetes mellitus with other specified complication: Secondary | ICD-10-CM

## 2020-10-04 MED ORDER — METFORMIN HCL 500 MG PO TABS: 500.0000 mg | ORAL_TABLET | Freq: Two times a day (BID) | ORAL | 3 refills | Status: DC

## 2020-10-04 MED ORDER — LEVOTHYROXINE SODIUM 100 MCG PO TABS: ORAL_TABLET | ORAL | 3 refills | Status: DC

## 2020-10-04 NOTE — Telephone Encounter (Signed)
Metformin and Levothyroxine sent to [facility]

## 2020-10-31 ENCOUNTER — Other Ambulatory Visit (HOSPITAL_COMMUNITY): Payer: Self-pay | Admitting: Family Medicine

## 2020-10-31 DIAGNOSIS — Z1231 Encounter for screening mammogram for malignant neoplasm of breast: Secondary | ICD-10-CM

## 2020-11-02 ENCOUNTER — Encounter: Payer: Self-pay | Admitting: *Deleted

## 2020-11-14 ENCOUNTER — Other Ambulatory Visit: Payer: Self-pay | Admitting: Family Medicine

## 2020-11-14 DIAGNOSIS — I152 Hypertension secondary to endocrine disorders: Secondary | ICD-10-CM

## 2020-11-14 DIAGNOSIS — E1159 Type 2 diabetes mellitus with other circulatory complications: Secondary | ICD-10-CM

## 2020-12-03 ENCOUNTER — Other Ambulatory Visit: Payer: Self-pay

## 2020-12-03 ENCOUNTER — Ambulatory Visit: Payer: BC Managed Care – PPO | Admitting: Family Medicine

## 2020-12-03 ENCOUNTER — Encounter: Payer: Self-pay | Admitting: Family Medicine

## 2020-12-03 VITALS — BP 117/70 | HR 68 | Temp 97.1°F | Ht 64.0 in | Wt 191.6 lb

## 2020-12-03 DIAGNOSIS — R197 Diarrhea, unspecified: Secondary | ICD-10-CM

## 2020-12-03 DIAGNOSIS — K219 Gastro-esophageal reflux disease without esophagitis: Secondary | ICD-10-CM

## 2020-12-03 MED ORDER — FAMOTIDINE 20 MG PO TABS: 20.0000 mg | ORAL_TABLET | Freq: Two times a day (BID) | ORAL | 0 refills | Status: DC

## 2020-12-03 MED ORDER — ONDANSETRON 4 MG PO TBDP: 4.0000 mg | ORAL_TABLET | Freq: Three times a day (TID) | ORAL | 0 refills | Status: DC | PRN

## 2020-12-03 NOTE — Progress Notes (Signed)
Subjective: CC: GI issues PCP: Janora Norlander, DO [name] is a 65 y.o. female presenting to clinic today for:  1.  Diarrhea Patient reports that she developed profuse diarrhea where she is going 6+ times per day and having watery stools each time.  This onset Thursday after she consumed a grilled chicken sandwich at the cafeteria and has persisted since that time.  She reports some epigastric pain and nausea.  She had some cramping in her right leg over the weekend but that has gone away now.  She is hydrating adequately with water.  Denies any blood in stool.  No measured fevers.  No vomiting.  No known sick contacts.  No other persons with foodborne illness that she knows of.  She does report increased acid reflux since symptoms started but is not taking anything for this.   ROS: Per HPI  Allergies  Allergen Reactions   Voltaren [Diclofenac Sodium] Shortness Of Breath and Other (See Comments)    Wheezing, coughing, runny nose   Past Medical History:  Diagnosis Date   Arthritis    Blood transfusion without reported diagnosis    Cataract    small beginning   Depression    Diabetes mellitus without complication ([facility])    Hyperlipidemia    Hypertension    Hypothyroidism     Current Outpatient Medications:    amLODipine-olmesartan (AZOR) 10-40 MG tablet, TAKE 1 TABLET BY MOUTH  DAILY, Disp: 90 tablet, Rfl: 0   aspirin EC 81 MG tablet, Take 81 mg by mouth daily., Disp: , Rfl:    atorvastatin (LIPITOR) 20 MG tablet, Take 1 tablet 3 days per week for cholesterol., Disp: 36 tablet, Rfl: 3   Blood Glucose Monitoring Suppl (CONTOUR NEXT MONITOR) w/Device KIT, , Disp: , Rfl:    Coenzyme Q10 (CO Q 10 PO), Take by mouth., Disp: , Rfl:    CONTOUR NEXT TEST test strip, , Disp: , Rfl:    Lancets (ONETOUCH ULTRASOFT) lancets, 1 Device by Misc.(Non-Drug; Combo Route) route daily., Disp: , Rfl:    levothyroxine (SYNTHROID) 100 MCG tablet, TAKE 1 TABLET BY MOUTH  DAILY BEFORE  BREAKFAST, Disp: 90 tablet, Rfl: 3   metFORMIN (GLUCOPHAGE) 500 MG tablet, Take 1 tablet (500 mg total) by mouth 2 (two) times daily with a meal., Disp: 180 tablet, Rfl: 3   Omega-3 Fatty Acids (FISH OIL) 1000 MG CAPS, Take 1,000 mg by mouth daily., Disp: , Rfl:    VITAMIN D PO, Take by mouth., Disp: , Rfl:  Social History   Socioeconomic History   Marital status: Single    Spouse name: Not on file   Number of children: Not on file   Years of education: Not on file   Highest education level: Not on file  Occupational History   Not on file  Tobacco Use   Smoking status: Never   Smokeless tobacco: Never  Vaping Use   Vaping Use: Never used  Substance and Sexual Activity   Alcohol use: No   Drug use: No   Sexual activity: Not on file  Other Topics Concern   Not on file  Social History Narrative   Not on file   Social Determinants of Health   Financial Resource Strain: Not on file  Food Insecurity: Not on file  Transportation Needs: Not on file  Physical Activity: Not on file  Stress: Not on file  Social Connections: Not on file  Intimate Partner Violence: Not on file   Family History  Problem Relation Age of Onset   Diabetes Mother    Hypertension Mother    Diabetes Sister    Hypertension Sister    Colon polyps Sister    Diabetes Brother    Early death Son    Diabetes Maternal Aunt    Colon cancer Maternal Aunt    Diabetes Maternal Uncle    Esophageal cancer Neg Hx    Rectal cancer Neg Hx    Stomach cancer Neg Hx     Objective: Office vital signs reviewed. BP 117/70   Pulse 68   Temp (!) 97.1 F (36.2 C)   Ht _0  (1.626 m)   Wt 191 lb 9.6 oz (86.9 kg)   SpO2 99%   BMI 32.89 kg/m   Physical Examination:  General: Awake, alert, well nourished, nontoxic.  No acute distress GI: soft, non-tender, non-distended, bowel sounds present x4, no hepatomegaly, no splenomegaly, no masses Skin: Minimal reduction in turgor  Assessment/ Plan: 65 y.o. female    Diarrhea of presumed infectious origin - Plan: Cdiff NAA+O+P+Stool Culture, Basic Metabolic Panel, Magnesium, ondansetron (ZOFRAN ODT) 4 MG disintegrating tablet  Gastroesophageal reflux disease without esophagitis - Plan: ondansetron (ZOFRAN ODT) 4 MG disintegrating tablet, famotidine (PEPCID) 20 MG tablet  Suspect that she likely has foodborne illness.  However, we discussed the limitations with antibiotics with these types of infections.  Stool samples ordered.  Zofran prescribed.  Check electrolytes given profuse diarrhea.  Overall she was well-appearing with minimal evidence of dehydration on exam.  Reinforced oral fluids and red flag signs and symptoms were discussed  Pepcid provided for increased acid reflux.  If persistent, could consider testing for H. pylori  Orders Placed This Encounter  Procedures   Cdiff NAA+O+P+Stool Culture   Basic Metabolic Panel   Magnesium   No orders of the defined types were placed in this encounter.    Janora Norlander, DO [place] [phone]

## 2020-12-03 NOTE — Patient Instructions (Signed)
Diarrhea, Adult Diarrhea is when you pass loose and watery poop (stool) often. Diarrhea can make you feel weak and cause you to lose water in your body (get dehydrated). Losing water in your body can cause you to: Feel tired and thirsty. Have a dry mouth. Go pee (urinate) less often. Diarrhea often lasts 2-3 days. However, it can last longer if it is a sign of something more serious. It is important to treat your diarrhea as told by your doctor. Follow these instructions at home: Eating and drinking   Follow these instructions as told by your doctor: Take an ORS (oral rehydration solution). This is a drink that helps you replace fluids and minerals your body lost. It is sold at pharmacies and stores. Drink plenty of fluids, such as: Water. Ice chips. Diluted fruit juice. Low-calorie sports drinks. Milk, if you want. Avoid drinking fluids that have a lot of sugar or caffeine in them. Eat bland, easy-to-digest foods in small amounts as you are able. These foods include: Bananas. Applesauce. Rice. Low-fat (lean) meats. Toast. Crackers. Avoid alcohol. Avoid spicy or fatty foods.  Medicines Take over-the-counter and prescription medicines only as told by your doctor. If you were prescribed an antibiotic medicine, take it as told by your doctor. Do not stop using the antibiotic even if you start to feel better. General instructions  Wash your hands often using soap and water. If soap and water are not available, use a hand sanitizer. Others in your home should wash their hands as well. Hands should be washed: After using the toilet or changing a diaper. Before preparing, cooking, or serving food. While caring for a sick person. While visiting someone in a hospital. Drink enough fluid to keep your pee (urine) pale yellow. Rest at home while you get better. Watch your condition for any changes. Take a warm bath to help with any burning or pain from having diarrhea. Keep all  follow-up visits as told by your doctor. This is important. Contact a doctor if: You have a fever. Your diarrhea gets worse. You have new symptoms. You cannot keep fluids down. You feel light-headed or dizzy. You have a headache. You have muscle cramps. Get help right away if: You have chest pain. You feel very weak or you pass out (faint). You have bloody or black poop or poop that looks like tar. You have very bad pain, cramping, or bloating in your belly (abdomen). You have trouble breathing or you are breathing very quickly. Your heart is beating very quickly. Your skin feels cold and clammy. You feel confused. You have signs of losing too much water in your body, such as: Dark pee, very little pee, or no pee. Cracked lips. Dry mouth. Sunken eyes. Sleepiness. Weakness. Summary Diarrhea is when you pass loose and watery poop (stool) often. Diarrhea can make you feel weak and cause you to lose water in your body (get dehydrated). Take an ORS (oral rehydration solution). This is a drink that is sold at pharmacies and stores. Eat bland, easy-to-digest foods in small amounts as you are able. Contact a doctor if your condition gets worse. Get help right away if you have signs that you have lost too much water in your body. This information is not intended to replace advice given to you by your health care provider. Make sure you discuss any questions you have with your health care provider. Document Revised: 08/28/2017 Document Reviewed: 08/28/2017 Elsevier Patient Education  2022 [employer]  

## 2020-12-04 LAB — MAGNESIUM: Magnesium: 1.8 mg/dL (ref 1.6–2.3)

## 2020-12-04 LAB — BASIC METABOLIC PANEL
BUN/Creatinine Ratio: 15 (ref 12–28)
BUN: 13 mg/dL (ref 8–27)
CO2: 15 mmol/L — ABNORMAL LOW (ref 20–29)
Calcium: 9 mg/dL (ref 8.7–10.3)
Chloride: 103 mmol/L (ref 96–106)
Creatinine, Ser: 0.87 mg/dL (ref 0.57–1.00)
Glucose: 111 mg/dL — ABNORMAL HIGH (ref 65–99)
Potassium: 4.4 mmol/L (ref 3.5–5.2)
Sodium: 137 mmol/L (ref 134–144)
eGFR: 74 mL/min/{1.73_m2} (ref >59)

## 2020-12-12 ENCOUNTER — Ambulatory Visit (HOSPITAL_COMMUNITY)
Admission: RE | Admit: 2020-12-12 | Discharge: 2020-12-12 | Disposition: A | Discharge status: Home / Self Care | Payer: BC Managed Care – PPO | Source: Ambulatory Visit | Attending: Family Medicine | Admitting: Family Medicine

## 2020-12-12 ENCOUNTER — Other Ambulatory Visit: Payer: Self-pay

## 2020-12-12 DIAGNOSIS — Z1231 Encounter for screening mammogram for malignant neoplasm of breast: Secondary | ICD-10-CM

## 2020-12-17 LAB — CDIFF NAA+O+P+STOOL CULTURE
E coli, Shiga toxin Assay: NEGATIVE
Toxigenic C. Difficile by PCR: NEGATIVE

## 2021-01-16 DIAGNOSIS — Z23 Encounter for immunization: Secondary | ICD-10-CM | POA: Diagnosis not present

## 2021-02-05 ENCOUNTER — Other Ambulatory Visit: Payer: Self-pay | Admitting: Family Medicine

## 2021-02-05 DIAGNOSIS — E1159 Type 2 diabetes mellitus with other circulatory complications: Secondary | ICD-10-CM

## 2021-02-05 DIAGNOSIS — I152 Hypertension secondary to endocrine disorders: Secondary | ICD-10-CM

## 2021-03-29 ENCOUNTER — Ambulatory Visit: Payer: BC Managed Care – PPO | Admitting: Family Medicine

## 2021-04-05 ENCOUNTER — Encounter: Payer: Self-pay | Admitting: Family Medicine

## 2021-04-05 ENCOUNTER — Other Ambulatory Visit: Payer: Self-pay

## 2021-04-05 ENCOUNTER — Ambulatory Visit: Payer: BC Managed Care – PPO | Admitting: Family Medicine

## 2021-04-05 DIAGNOSIS — E1169 Type 2 diabetes mellitus with other specified complication: Secondary | ICD-10-CM

## 2021-04-05 DIAGNOSIS — E1159 Type 2 diabetes mellitus with other circulatory complications: Secondary | ICD-10-CM | POA: Diagnosis not present

## 2021-04-05 DIAGNOSIS — E039 Hypothyroidism, unspecified: Secondary | ICD-10-CM | POA: Diagnosis not present

## 2021-04-05 DIAGNOSIS — E669 Obesity, unspecified: Secondary | ICD-10-CM

## 2021-04-05 DIAGNOSIS — I152 Hypertension secondary to endocrine disorders: Secondary | ICD-10-CM

## 2021-04-05 LAB — BAYER DCA HB A1C WAIVED: HB A1C (BAYER DCA - WAIVED): 6.8 % — ABNORMAL HIGH (ref 4.8–5.6)

## 2021-04-05 MED ORDER — AMLODIPINE-OLMESARTAN 10-40 MG PO TABS: 1.0000 | ORAL_TABLET | Freq: Every day | ORAL | 3 refills | Status: DC

## 2021-04-05 MED ORDER — RYBELSUS 3 MG PO TABS: 3.0000 mg | ORAL_TABLET | Freq: Every day | ORAL | 0 refills | Status: DC

## 2021-04-05 NOTE — Progress Notes (Signed)
Telephone visit  Subjective: CC:DM PCP: Janora Norlander, DO BTD:VVOHY Lisa Stephens is a 65 y.o. female calls for telephone consult today. Patient provides verbal consent for consult held via phone.  Due to COVID-19 pandemic this visit was conducted virtually. This visit type was conducted due to national recommendations for restrictions regarding the COVID-19 Pandemic (e.g. social distancing, sheltering in place) in an effort to limit this patient's exposure and mitigate transmission in our community. All issues noted in this document were discussed and addressed.  A physical exam was not performed with this format.   Location of patient: home Location of provider: [facility] Others present for call: none  1. Type 2 Diabetes with hypertension, hyperlipidemia:  She reports compliance with metformin, Azor and Lipitor.  Reports blood sugars have been a little bit elevated compared to her norm with most recent blood sugar 160.  She admits that she has been eating more carbs and has had some weight gain since her last visit.  She is not been exercising regularly as she had been but has a plan to start exercising again after the first of the year.  Last eye exam: needs Last foot exam: needs Last A1c:  Lab Results  Component Value Date   HGBA1C 6.6 09/27/2020   Nephropathy screen indicated?: UTD Last flu, zoster and/or pneumovax:  Immunization History  Administered Date(s) Administered   Influenza Inj Mdck Quad Pf 01/16/2021   Influenza Whole 01/23/2010, 01/27/2012, 01/25/2013, 02/02/2014, 01/18/2015   Influenza,inj,Quad PF,6+ Mos 12/26/2016   Influenza-Unspecified 01/06/2015, 01/15/2016, 12/29/2017, 01/19/2019, 01/16/2020   PFIZER(Purple Top)SARS-COV-2 Vaccination 06/21/2019, 07/12/2019, 01/17/2020   Pneumococcal Polysaccharide-23 10/16/2014   Tdap 01/25/2013   Zoster, Live 01/15/2016    ROS: No chest pain, shortness of breath  2.  Hypothyroidism Compliant with Synthroid 100 mcg daily.   Takes appropriately on an empty stomach.  No reports of tremor, heart palpitations but she does report weight gain.   ROS: Per HPI  Allergies  Allergen Reactions   Voltaren [Diclofenac Sodium] Shortness Of Breath and Other (See Comments)    Wheezing, coughing, runny nose   Past Medical History:  Diagnosis Date   Arthritis    Blood transfusion without reported diagnosis    Cataract    small beginning   Depression    Diabetes mellitus without complication (HCC)    Hyperlipidemia    Hypertension    Hypothyroidism     Current Outpatient Medications:    amLODipine-olmesartan (AZOR) 10-40 MG tablet, TAKE 1 TABLET BY MOUTH  DAILY, Disp: 90 tablet, Rfl: 0   aspirin EC 81 MG tablet, Take 81 mg by mouth daily., Disp: , Rfl:    atorvastatin (LIPITOR) 20 MG tablet, Take 1 tablet 3 days per week for cholesterol., Disp: 36 tablet, Rfl: 3   Blood Glucose Monitoring Suppl (CONTOUR NEXT MONITOR) w/Device KIT, , Disp: , Rfl:    Coenzyme Q10 (CO Q 10 PO), Take by mouth., Disp: , Rfl:    CONTOUR NEXT TEST test strip, , Disp: , Rfl:    famotidine (PEPCID) 20 MG tablet, Take 1 tablet (20 mg total) by mouth 2 (two) times daily for 14 days. For reflux, Disp: 28 tablet, Rfl: 0   Lancets (ONETOUCH ULTRASOFT) lancets, 1 Device by Misc.(Non-Drug; Combo Route) route daily., Disp: , Rfl:    levothyroxine (SYNTHROID) 100 MCG tablet, TAKE 1 TABLET BY MOUTH  DAILY BEFORE BREAKFAST, Disp: 90 tablet, Rfl: 3   metFORMIN (GLUCOPHAGE) 500 MG tablet, Take 1 tablet (500 mg total) by mouth 2 (  two) times daily with a meal., Disp: 180 tablet, Rfl: 3   Omega-3 Fatty Acids (FISH OIL) 1000 MG CAPS, Take 1,000 mg by mouth daily., Disp: , Rfl:    ondansetron (ZOFRAN ODT) 4 MG disintegrating tablet, Take 1 tablet (4 mg total) by mouth every 8 (eight) hours as needed for nausea or vomiting., Disp: 20 tablet, Rfl: 0   VITAMIN D PO, Take by mouth., Disp: , Rfl:   Assessment/ Plan: 65 y.o. female   Diabetes mellitus type 2 in  obese ([place]) - Plan: Bayer DCA Hb A1c Waived, Semaglutide (RYBELSUS) 3 MG TABS  Acquired hypothyroidism - Plan: TSH, T4, free  Hypertension associated with diabetes ([place]) - Plan: amLODipine-olmesartan (AZOR) 10-40 MG tablet  Trial of Rybelsus.  She has had some issues meeting her weight loss goals and I think this would help.  It also sounds like her sugar has been a little bit above her goal as of late as well.  Okay to discontinue metformin for now.  We will reconvene in 1 month via telephone visit and advance her to 7 mg as tolerated  Check thyroid levels.  Has weight gain as above.  Blood pressure will be checked by nurse when she comes in for labs today.  BP med sent  Start time: 9:30a End time: 9:47a  Total time spent on patient care (including telephone call/ virtual visit): 17 minutes  [address], [place] [phone]

## 2021-04-06 LAB — TSH: TSH: 2.53 u[IU]/mL (ref 0.450–4.500)

## 2021-04-06 LAB — T4, FREE: Free T4: 1.21 ng/dL (ref 0.82–1.77)

## 2021-05-03 ENCOUNTER — Telehealth: Payer: Self-pay | Admitting: *Deleted

## 2021-05-03 ENCOUNTER — Ambulatory Visit: Payer: BC Managed Care – PPO | Admitting: Family Medicine

## 2021-05-03 VITALS — Wt 199.0 lb

## 2021-05-03 DIAGNOSIS — E1169 Type 2 diabetes mellitus with other specified complication: Secondary | ICD-10-CM

## 2021-05-03 DIAGNOSIS — E669 Obesity, unspecified: Secondary | ICD-10-CM | POA: Diagnosis not present

## 2021-05-03 MED ORDER — RYBELSUS 7 MG PO TABS: 7.0000 mg | ORAL_TABLET | Freq: Every day | ORAL | 0 refills | Status: DC

## 2021-05-03 NOTE — Progress Notes (Signed)
Telephone visit  Subjective: CC: DM PCP: Janora Norlander, DO ZLD:JTTSV Armel is a 66 y.o. female calls for telephone consult today. Patient provides verbal consent for consult held via phone.  Due to COVID-19 pandemic this visit was conducted virtually. This visit type was conducted due to national recommendations for restrictions regarding the COVID-19 Pandemic (e.g. social distancing, sheltering in place) in an effort to limit this patient's exposure and mitigate transmission in our community. All issues noted in this document were discussed and addressed.  A physical exam was not performed with this format.   Location of patient: home Location of provider: [facility] Others present for call: none  1.  Type 2 diabetes Patient was started on Rybelsus 3 mg last visit to assist in some weight loss.  Her metformin was discontinued at that time.  She notes she is about 4 to 5 pounds down since her last visit.  She seen a positive impact on her appetite.  She is trying to work out as well and thinks that some of this may be confounded by muscle mass.  Her morning BG<150.  She denies any GI upset including abdominal pain, nausea, vomiting or diarrhea.  ROS: Per HPI  Allergies  Allergen Reactions   Voltaren [Diclofenac Sodium] Shortness Of Breath and Other (See Comments)    Wheezing, coughing, runny nose   Past Medical History:  Diagnosis Date   Arthritis    Blood transfusion without reported diagnosis    Cataract    small beginning   Depression    Diabetes mellitus without complication ([place])    Hyperlipidemia    Hypertension    Hypothyroidism     Current Outpatient Medications:    amLODipine-olmesartan (AZOR) 10-40 MG tablet, Take 1 tablet by mouth daily., Disp: 90 tablet, Rfl: 3   aspirin EC 81 MG tablet, Take 81 mg by mouth daily., Disp: , Rfl:    atorvastatin (LIPITOR) 20 MG tablet, Take 1 tablet 3 days per week for cholesterol., Disp: 36 tablet, Rfl: 3   Blood Glucose Monitoring  Suppl (CONTOUR NEXT MONITOR) w/Device KIT, , Disp: , Rfl:    Coenzyme Q10 (CO Q 10 PO), Take by mouth., Disp: , Rfl:    CONTOUR NEXT TEST test strip, , Disp: , Rfl:    famotidine (PEPCID) 20 MG tablet, Take 1 tablet (20 mg total) by mouth 2 (two) times daily for 14 days. For reflux, Disp: 28 tablet, Rfl: 0   Lancets (ONETOUCH ULTRASOFT) lancets, 1 Device by Misc.(Non-Drug; Combo Route) route daily., Disp: , Rfl:    levothyroxine (SYNTHROID) 100 MCG tablet, TAKE 1 TABLET BY MOUTH  DAILY BEFORE BREAKFAST, Disp: 90 tablet, Rfl: 3   Omega-3 Fatty Acids (FISH OIL) 1000 MG CAPS, Take 1,000 mg by mouth daily., Disp: , Rfl:    ondansetron (ZOFRAN ODT) 4 MG disintegrating tablet, Take 1 tablet (4 mg total) by mouth every 8 (eight) hours as needed for nausea or vomiting., Disp: 20 tablet, Rfl: 0   Semaglutide (RYBELSUS) 3 MG TABS, Take 3 mg by mouth daily., Disp: 30 tablet, Rfl: 0   VITAMIN D PO, Take by mouth., Disp: , Rfl:   Assessment/ Plan: 66 y.o. female   Diabetes mellitus type 2 in obese ([place]) - Plan: Semaglutide (RYBELSUS) 7 MG TABS  Blood sugar sounds are good responding well to the Rybelsus.  I am going to send in the 7 mg.  She is having some weight loss with the 3 mg, which is promising.  It looks  like we may need to pursue some type of patient assistance of breath or station for the Rybelsus.  For this reason I have placed a another 30-day supply of the 3 mg upfront for patient to have until we can get this medication taken care of.  She has follow-up with me in June, she knows that she is to see me sooner if any concerns or questions arise  Start time: 12:06pm End time: 12:11pm  Total time spent on patient care (including telephone call/ virtual visit): 5 minutes  [place], [place] [phone]

## 2021-05-03 NOTE — Telephone Encounter (Signed)
Lisa Stephens (Key: [id number]) Rx #: [phone] Rybelsus 7MG  tablets  Wait for Determination Please wait for [facility] 2017 NCPDP to return a determination.

## 2021-05-03 NOTE — Telephone Encounter (Signed)
Request Reference Number: [id number]. RYBELSUS TAB 7MG  is approved through 05/03/2022. Your patient may now fill this prescription and it will be covered.  WM aware

## 2021-05-13 ENCOUNTER — Telehealth: Payer: Self-pay | Admitting: Family Medicine

## 2021-05-13 DIAGNOSIS — E1169 Type 2 diabetes mellitus with other specified complication: Secondary | ICD-10-CM

## 2021-05-13 DIAGNOSIS — E669 Obesity, unspecified: Secondary | ICD-10-CM

## 2021-05-13 NOTE — Telephone Encounter (Signed)
°  Prescription Request  05/13/2021  Is this a "Controlled Substance" medicine? no  Have you seen your PCP in the last 2 weeks? yes  If YES, route message to pool  -  If NO, patient needs to be scheduled for appointment.  What is the name of the medication or equipment? Semaglutide (RYBELSUS) 7 MG TABS  Have you contacted your pharmacy to request a refill? yes   Which pharmacy would you like this sent to? [facility] ([place], [place] (Ph: [phone])   Patient notified that their request is being sent to the clinical staff for review and that they should receive a response within 2 business days.

## 2021-05-14 MED ORDER — RYBELSUS 7 MG PO TABS: 7.0000 mg | ORAL_TABLET | Freq: Every day | ORAL | 0 refills | Status: DC

## 2021-05-14 NOTE — Telephone Encounter (Signed)
LMOVM medication sent to mail order pharmacy

## 2021-07-04 LAB — HM DIABETES EYE EXAM

## 2021-08-06 LAB — BASIC METABOLIC PANEL
BUN: 9 (ref 4–21)
CO2: 26 — AB (ref 13–22)
Chloride: 104 (ref 99–108)
Creatinine: 0.7 (ref 0.5–1.1)
Glucose: 115
Potassium: 4.7 mEq/L (ref 3.5–5.1)
Sodium: 141 (ref 137–147)

## 2021-08-06 LAB — CBC AND DIFFERENTIAL
HCT: 39 (ref 36–46)
Hemoglobin: 12.9 (ref 12.0–16.0)
Neutrophils Absolute: 3
Platelets: 324 10*3/uL (ref 150–400)
WBC: 7

## 2021-08-06 LAB — HEPATIC FUNCTION PANEL
ALT: 35 U/L (ref 7–35)
AST: 30 (ref 13–35)
Alkaline Phosphatase: 122 (ref 25–125)
Bilirubin, Total: 0.3

## 2021-08-06 LAB — COMPREHENSIVE METABOLIC PANEL
Albumin: 4.2 (ref 3.5–5.0)
Calcium: 9.1 (ref 8.7–10.7)
Globulin: 2.6
eGFR: 93

## 2021-08-06 LAB — LIPID PANEL
Cholesterol: 156 (ref 0–200)
HDL: 50 (ref 35–70)
LDL Cholesterol: 90
Triglycerides: 86 (ref 40–160)

## 2021-08-06 LAB — VITAMIN B12: Vitamin B-12: 820

## 2021-08-06 LAB — TSH: TSH: 1.33 (ref 0.41–5.90)

## 2021-08-06 LAB — CBC: RBC: 4.39 (ref 3.87–5.11)

## 2021-08-06 LAB — VITAMIN D 25 HYDROXY (VIT D DEFICIENCY, FRACTURES): Vit D, 25-Hydroxy: 41.4

## 2021-08-06 LAB — HEMOGLOBIN A1C: Hemoglobin A1C: 6.2

## 2021-10-04 ENCOUNTER — Encounter: Payer: Self-pay | Admitting: Family Medicine

## 2021-10-04 ENCOUNTER — Ambulatory Visit: Payer: BC Managed Care – PPO | Admitting: Family Medicine

## 2021-10-04 VITALS — BP 141/68 | HR 57 | Temp 98.1°F | Ht 64.0 in | Wt 194.6 lb

## 2021-10-04 DIAGNOSIS — E039 Hypothyroidism, unspecified: Secondary | ICD-10-CM

## 2021-10-04 DIAGNOSIS — E669 Obesity, unspecified: Secondary | ICD-10-CM

## 2021-10-04 DIAGNOSIS — E1169 Type 2 diabetes mellitus with other specified complication: Secondary | ICD-10-CM

## 2021-10-04 DIAGNOSIS — E1159 Type 2 diabetes mellitus with other circulatory complications: Secondary | ICD-10-CM

## 2021-10-04 DIAGNOSIS — E785 Hyperlipidemia, unspecified: Secondary | ICD-10-CM | POA: Diagnosis not present

## 2021-10-04 DIAGNOSIS — I152 Hypertension secondary to endocrine disorders: Secondary | ICD-10-CM

## 2021-10-04 DIAGNOSIS — R21 Rash and other nonspecific skin eruption: Secondary | ICD-10-CM

## 2021-10-04 MED ORDER — PREDNISONE 10 MG (21) PO TBPK: ORAL_TABLET | ORAL | 0 refills | Status: DC

## 2021-10-04 MED ORDER — HYDROXYZINE PAMOATE 25 MG PO CAPS: 25.0000 mg | ORAL_CAPSULE | Freq: Three times a day (TID) | ORAL | 0 refills | Status: DC | PRN

## 2021-10-04 MED ORDER — LEVOTHYROXINE SODIUM 100 MCG PO TABS: ORAL_TABLET | ORAL | 3 refills | Status: DC

## 2021-10-04 MED ORDER — METHYLPREDNISOLONE ACETATE 80 MG/ML IJ SUSP
80.0000 mg | Freq: Once | INTRAMUSCULAR | Status: AC
  Administered 2021-10-04: 80 mg via INTRAMUSCULAR

## 2021-10-04 MED ORDER — RYBELSUS 14 MG PO TABS: 14.0000 mg | ORAL_TABLET | Freq: Every day | ORAL | 4 refills | Status: DC

## 2021-10-04 NOTE — Progress Notes (Signed)
Subjective: CC:DM PCP: Janora Norlander, DO ZOX:WRUEA Murray is a 66 y.o. female presenting to clinic today for:  1. Type 2 Diabetes with hypertension, hyperlipidemia:  Patient reports compliance to medication.  She is currently up to 14 mg of Rybelsus daily.  This was advanced by the nurse practitioner at work, [place].  She is tolerating the medication without difficulty and has had a 10 pound weight loss since starting the medication.  She of course would like to have more weight loss but has not really prepared to go to any type of injection therapy.  She is compliant with her Lipitor 3 days/week.  She is been tolerating this without difficulty and most recent LDL was just at 90.  Compliant with the Azor.  Last eye exam: ROI completed Last foot exam: Needs Last A1c:  Lab Results  Component Value Date   HGBA1C 6.2 08/06/2021   Nephropathy screen indicated?:  On ARB Last flu, zoster and/or pneumovax:  Immunization History  Administered Date(s) Administered   Influenza Inj Mdck Quad Pf 01/16/2021   Influenza Whole 01/23/2010, 01/27/2012, 01/25/2013, 02/02/2014, 01/18/2015   Influenza,inj,Quad PF,6+ Mos 12/26/2016   Influenza-Unspecified 01/23/2010, 01/27/2012, 01/25/2013, 02/02/2014, 01/06/2015, 01/18/2015, 01/15/2016, 12/29/2017, 01/19/2019, 01/16/2020   PFIZER(Purple Top)SARS-COV-2 Vaccination 06/21/2019, 07/12/2019, 01/17/2020   Pneumococcal Polysaccharide-23 10/16/2014   Tdap 01/25/2013   Zoster, Live 01/15/2016    ROS: Denies dizziness, LOC, polyuria, polydipsia, unintended weight loss/gain, foot ulcerations, numbness or tingling in extremities, shortness of breath or chest pain.  2.  Hypothyroidism Compliant with Synthroid 100 mcg daily.  No reports of tremor, heart palpitations, difficulty swallowing.  Most recent TSH was normal with her nurse practitioner just last month  3.  Rash She unfortunately has been suffering from a rash on her eye, back and chest for the last  several days.  She does not report any contact with any new lotions, detergents, soaps, foods or medications.  She does report that the rash is itchy and refractory to use of OTC hydrocortisone cream.  No known tick bites in the past.  ROS: Per HPI  Allergies  Allergen Reactions   Voltaren [Diclofenac Sodium] Shortness Of Breath and Other (See Comments)    Wheezing, coughing, runny nose   Past Medical History:  Diagnosis Date   Arthritis    Blood transfusion without reported diagnosis    Cataract    small beginning   Depression    Diabetes mellitus without complication ([place])    Hyperlipidemia    Hypertension    Hypothyroidism     Current Outpatient Medications:    amLODipine-olmesartan (AZOR) 10-40 MG tablet, Take 1 tablet by mouth daily., Disp: 90 tablet, Rfl: 3   aspirin EC 81 MG tablet, Take 81 mg by mouth daily., Disp: , Rfl:    atorvastatin (LIPITOR) 20 MG tablet, Take 1 tablet 3 days per week for cholesterol., Disp: 36 tablet, Rfl: 3   Blood Glucose Monitoring Suppl (CONTOUR NEXT MONITOR) w/Device KIT, , Disp: , Rfl:    Coenzyme Q10 (CO Q 10 PO), Take by mouth., Disp: , Rfl:    CONTOUR NEXT TEST test strip, , Disp: , Rfl:    Lancets (ONETOUCH ULTRASOFT) lancets, 1 Device by Misc.(Non-Drug; Combo Route) route daily., Disp: , Rfl:    levothyroxine (SYNTHROID) 100 MCG tablet, TAKE 1 TABLET BY MOUTH  DAILY BEFORE BREAKFAST, Disp: 90 tablet, Rfl: 3   Omega-3 Fatty Acids (FISH OIL) 1000 MG CAPS, Take 1,000 mg by mouth daily., Disp: , Rfl:  ondansetron (ZOFRAN ODT) 4 MG disintegrating tablet, Take 1 tablet (4 mg total) by mouth every 8 (eight) hours as needed for nausea or vomiting., Disp: 20 tablet, Rfl: 0   Semaglutide (RYBELSUS) 7 MG TABS, Take 7 mg by mouth daily., Disp: 90 tablet, Rfl: 0   VITAMIN D PO, Take by mouth., Disp: , Rfl:    famotidine (PEPCID) 20 MG tablet, Take 1 tablet (20 mg total) by mouth 2 (two) times daily for 14 days. For reflux, Disp: 28 tablet, Rfl:  0 Social History   Socioeconomic History   Marital status: Single    Spouse name: Not on file   Number of children: Not on file   Years of education: Not on file   Highest education level: Not on file  Occupational History   Not on file  Tobacco Use   Smoking status: Never   Smokeless tobacco: Never  Vaping Use   Vaping Use: Never used  Substance and Sexual Activity   Alcohol use: No   Drug use: No   Sexual activity: Not on file  Other Topics Concern   Not on file  Social History Narrative   Not on file   Social Determinants of Health   Financial Resource Strain: Not on file  Food Insecurity: Not on file  Transportation Needs: Not on file  Physical Activity: Not on file  Stress: Not on file  Social Connections: Not on file  Intimate Partner Violence: Not on file   Family History  Problem Relation Age of Onset   Diabetes Mother    Hypertension Mother    Diabetes Sister    Hypertension Sister    Colon polyps Sister    Diabetes Brother    Early death Son    Diabetes Maternal Aunt    Colon cancer Maternal Aunt    Diabetes Maternal Uncle    Esophageal cancer Neg Hx    Rectal cancer Neg Hx    Stomach cancer Neg Hx     Objective: Office vital signs reviewed. BP (!) 141/68   Pulse (!) 57   Temp 98.1 F (36.7 C)   Ht 5' 4"  (1.626 m)   Wt 194 lb 9.6 oz (88.3 kg)   SpO2 98%   BMI 33.40 kg/m   Physical Examination:  General: Awake, alert, well nourished, No acute distress HEENT: Sclera white.  Moist mucous membranes Cardio: regular rate and rhythm, S1S2 heard, no murmurs appreciated Pulm: clear to auscultation bilaterally, no wheezes, rhonchi or rales; normal work of breathing on room air Skin: Slightly raised, erythematous rash noted along the left eyelid, back chest and left axilla. Neuro: see DM foot Diabetic Foot Exam - Simple   Simple Foot Form Diabetic Foot exam was performed with the following findings: Yes 10/04/2021  4:41 PM  Visual Inspection No  deformities, no ulcerations, no other skin breakdown bilaterally: Yes Sensation Testing Intact to touch and monofilament testing bilaterally: Yes Pulse Check Posterior Tibialis and Dorsalis pulse intact bilaterally: Yes Comments      Assessment/ Plan: 66 y.o. female   Diabetes mellitus type 2 in obese ([place]) - Plan: Semaglutide (RYBELSUS) 14 MG TABS, CANCELED: Bayer DCA Hb A1c Waived  Hypertension associated with diabetes ([place])  Hyperlipidemia associated with type 2 diabetes mellitus ([place]) - Plan: Hepatic Function Panel  Acquired hypothyroidism - Plan: levothyroxine (SYNTHROID) 100 MCG tablet, CANCELED: TSH, CANCELED: T4, free  Rash and nonspecific skin eruption - Plan: methylPREDNISolone acetate (DEPO-MEDROL) injection 80 mg, predniSONE (STERAPRED UNI-PAK 21 TAB) 10  MG (21) TBPK tablet, hydrOXYzine (VISTARIL) 25 MG capsule, Alpha-Gal Panel, Hepatic Function Panel  Remains under excellent control.  I did not collect her A1c today because this was normal just last month with her nurse practitioner.  I have updated her prescription to reflect the 14 mg she is currently getting of the Rybelsus.  Should she decide that she wants to transition over to something like Ozempic or Trulicity I be glad to oblige her  Blood pressure is controlled.  No changes  Not yet due for fasting lipid.  Check hepatic function panel given slight elevation in LFTs noted on most recent labs  Not sure what she is getting this rash from but I will check a red meat allergy panel.  I have given her Depo injection and she will start prednisone Dosepak tomorrow orally.  Okay to take hydroxyzine as needed itching.  Caution sedation   Orders Placed This Encounter  Procedures   TSH   Bayer DCA Hb A1c Waived   T4, free   Basic metabolic panel    This external order was created through the Results Console.   Basic metabolic panel    This external order was created through the Results Console.   Comprehensive  metabolic panel    This external order was created through the Results Console.   No orders of the defined types were placed in this encounter.    Janora Norlander, DO [place] [phone]

## 2021-10-09 ENCOUNTER — Other Ambulatory Visit: Payer: BC Managed Care – PPO

## 2021-10-09 ENCOUNTER — Other Ambulatory Visit: Payer: Self-pay

## 2021-10-09 DIAGNOSIS — R6889 Other general symptoms and signs: Secondary | ICD-10-CM | POA: Diagnosis not present

## 2021-10-09 DIAGNOSIS — R748 Abnormal levels of other serum enzymes: Secondary | ICD-10-CM

## 2021-10-10 LAB — ALPHA-GAL PANEL
Allergen Lamb IgE: <0.1 kU/L
Beef IgE: <0.1 kU/L
IgE (Immunoglobulin E), Serum: 62 IU/mL (ref 6–495)
O215-IgE Alpha-Gal: 0.27 kU/L — AB
Pork IgE: <0.1 kU/L

## 2021-10-10 LAB — HEPATIC FUNCTION PANEL
ALT: 31 IU/L (ref 0–32)
AST: 26 IU/L (ref 0–40)
Albumin: 4.2 g/dL (ref 3.8–4.8)
Alkaline Phosphatase: 122 IU/L — ABNORMAL HIGH (ref 44–121)
Bilirubin Total: 0.5 mg/dL (ref 0.0–1.2)
Bilirubin, Direct: 0.14 mg/dL (ref 0.00–0.40)
Total Protein: 6.8 g/dL (ref 6.0–8.5)

## 2021-10-12 LAB — ALKALINE PHOSPHATASE, ISOENZYMES
Alkaline Phosphatase: 122 IU/L — ABNORMAL HIGH (ref 44–121)
BONE FRACTION: 32 % (ref 14–68)
INTESTINAL FRAC.: 9 % (ref 0–18)
LIVER FRACTION: 59 % (ref 18–85)

## 2021-10-29 ENCOUNTER — Other Ambulatory Visit: Payer: Self-pay | Admitting: Nurse Practitioner

## 2021-10-29 ENCOUNTER — Encounter: Payer: Self-pay | Admitting: Nurse Practitioner

## 2021-10-29 ENCOUNTER — Ambulatory Visit: Payer: BC Managed Care – PPO | Admitting: Nurse Practitioner

## 2021-10-29 VITALS — BP 167/80 | HR 63 | Temp 98.6°F | Ht 64.0 in | Wt 190.0 lb

## 2021-10-29 DIAGNOSIS — R42 Dizziness and giddiness: Secondary | ICD-10-CM | POA: Diagnosis not present

## 2021-10-29 DIAGNOSIS — R112 Nausea with vomiting, unspecified: Secondary | ICD-10-CM | POA: Diagnosis not present

## 2021-10-29 MED ORDER — MECLIZINE HCL 12.5 MG PO TABS: 12.5000 mg | ORAL_TABLET | Freq: Three times a day (TID) | ORAL | 0 refills | Status: DC | PRN

## 2021-10-29 MED ORDER — ONDANSETRON HCL 4 MG PO TABS: 4.0000 mg | ORAL_TABLET | Freq: Three times a day (TID) | ORAL | 0 refills | Status: DC | PRN

## 2021-10-29 MED ORDER — LOPERAMIDE HCL 2 MG PO TABS: 2.0000 mg | ORAL_TABLET | Freq: Four times a day (QID) | ORAL | 0 refills | Status: DC | PRN

## 2021-10-29 NOTE — Progress Notes (Signed)
Acute Office Visit  Subjective:     Patient ID: Lisa Stephens, female    DOB: 07-11-1955, 66 y.o.   MRN: [id number]  Chief Complaint  Patient presents with   Diarrhea   Emesis   Dizziness    About 2 days     Dizziness This is a new problem. The current episode started yesterday. The problem occurs constantly. The problem has been unchanged. Associated symptoms include vomiting. Pertinent negatives include no chills, fever or rash.  Emesis  This is a new problem. The current episode started yesterday. The problem occurs 2 to 4 times per day. The problem has been gradually improving. There has been no fever. Associated symptoms include dizziness. Pertinent negatives include no chills or fever. Risk factors include ill contacts. She has tried nothing for the symptoms.     Review of Systems  Constitutional: Negative.  Negative for chills and fever.  HENT: Negative.    Eyes: Negative.   Respiratory: Negative.    Cardiovascular: Negative.   Gastrointestinal:  Positive for vomiting.  Genitourinary: Negative.   Musculoskeletal: Negative.   Skin: Negative.  Negative for rash.  Neurological:  Positive for dizziness.  All other systems reviewed and are negative.       Objective:    BP (!) 167/80   Pulse 63   Temp 98.6 F (37 C)   Ht '5\' 4"'$  (1.626 m)   Wt 190 lb (86.2 kg)   SpO2 96%   BMI 32.61 kg/m  BP Readings from Last 3 Encounters:  10/29/21 (!) 167/80  10/04/21 (!) 141/68  12/03/20 117/70   Wt Readings from Last 3 Encounters:  10/29/21 190 lb (86.2 kg)  10/04/21 194 lb 9.6 oz (88.3 kg)  05/03/21 199 lb (90.3 kg)      Physical Exam Vitals and nursing note reviewed.  Constitutional:      Appearance: She is normal weight.  HENT:     Head: Normocephalic.     Right Ear: External ear normal.     Left Ear: External ear normal.     Nose: Nose normal.     Mouth/Throat:     Mouth: Mucous membranes are moist.     Pharynx: Oropharynx is clear.  Eyes:      Conjunctiva/sclera: Conjunctivae normal.  Cardiovascular:     Rate and Rhythm: Normal rate and regular rhythm.     Pulses: Normal pulses.     Heart sounds: Normal heart sounds.  Pulmonary:     Effort: Pulmonary effort is normal.     Breath sounds: Normal breath sounds.  Abdominal:     General: Bowel sounds are normal.  Skin:    General: Skin is warm.  Neurological:     General: No focal deficit present.     Mental Status: She is alert and oriented to person, place, and time.     Sensory: Sensation is intact.     Motor: Motor function is intact.     Coordination: Coordination is intact.  Psychiatric:        Behavior: Behavior normal.     No results found for any visits on 10/29/21.      Assessment & Plan:  Patient presents with elevated blood pressure, nausea, vomiting and dizziness.  Symptoms present in the past 2 days.  Patient reports taking blood pressure medication just right before coming to clinic that may have attributed to the elevation. With nausea, vomiting and diarrhea patient reports eating Janine [place] on Sunday.  No other changes  that she can remember.  I advised patient to continue with brat diet and for nausea and vomiting to replace electrolytes, increase hydration, brat diet recommended.  Zofran 4 mg tablet every 8 hours as needed for nausea, Imodium for diarrhea.  Patient knows to follow-up with worsening unresolved symptoms. Problem List Items Addressed This Visit   None Visit Diagnoses     Nausea and vomiting, unspecified vomiting type    -  Primary   Relevant Medications   ondansetron (ZOFRAN) 4 MG tablet   loperamide (IMODIUM A-D) 2 MG tablet   Dizziness       Relevant Medications   meclizine (ANTIVERT) 12.5 MG tablet   ondansetron (ZOFRAN) 4 MG tablet       Meds ordered this encounter  Medications   meclizine (ANTIVERT) 12.5 MG tablet    Sig: Take 1 tablet (12.5 mg total) by mouth 3 (three) times daily as needed for dizziness.    Dispense:  30  tablet    Refill:  0    Order Specific Question:   Supervising Provider    Answer:   Claretta Fraise [[id number]]   ondansetron (ZOFRAN) 4 MG tablet    Sig: Take 1 tablet (4 mg total) by mouth every 8 (eight) hours as needed for nausea or vomiting.    Dispense:  20 tablet    Refill:  0    Order Specific Question:   Supervising Provider    Answer:   Jeneen Rinks   loperamide (IMODIUM A-D) 2 MG tablet    Sig: Take 1 tablet (2 mg total) by mouth 4 (four) times daily as needed for diarrhea or loose stools.    Dispense:  30 tablet    Refill:  0    Order Specific Question:   Supervising Provider    Answer:   Claretta Fraise [phone]    Return if symptoms worsen or fail to improve.  Ivy Lynn, NP

## 2021-10-29 NOTE — Progress Notes (Unsigned)
   Acute Office Visit  Subjective:     Patient ID: Lisa Stephens, female    DOB: 1955-11-10, 66 y.o.   MRN: [id number]  No chief complaint on file.   HPI Patient is in today for ***  ROS      Objective:    There were no vitals taken for this visit. {Vitals History (Optional):23777}  Physical Exam  No results found for any visits on 10/29/21.      Assessment & Plan:   Problem List Items Addressed This Visit   None   No orders of the defined types were placed in this encounter.   No follow-ups on file.  Ivy Lynn, NP

## 2021-10-29 NOTE — Patient Instructions (Signed)
Dizziness Dizziness is a common problem. It makes you feel unsteady or light-headed. You may feel like you are about to pass out (faint). Dizziness can lead to getting hurt if you stumble or fall. Dizziness can be caused by many things, including: Medicines. Not having enough water in your body (dehydration). Illness. Follow these instructions at home: Eating and drinking  Drink enough fluid to keep your pee (urine) pale yellow. This helps to keep you from getting dehydrated. Try to drink more clear fluids, such as water. Do not drink alcohol. Limit how much caffeine you drink or eat, if your doctor tells you to do that. Limit how much salt (sodium) you drink or eat, if your doctor tells you to do that. Activity pibNameNausea, Adult Nausea is feeling like you may vomit. Feeling like you may vomit is usually not serious, but it may be an early sign of a more serious medical problem. Vomiting is when stomach contents forcefully come out of your mouth. If you vomit, or if you are not able to drink enough fluids, you may not have enough water in your body (get dehydrated). If you do not have enough water in your body, you may: Feel tired. Feel thirsty. Have a dry mouth. Have cracked lips. Pee (urinate) less often. Older adults and people who have other diseases or a weak body defense system (immune system) have a higher risk of not having enough water in the body. The main goals of treating this condition are: To relieve your nausea. To ensure your nausea occurs less often. To prevent vomiting and losing too much fluid. Follow these instructions at home: Watch your symptoms for any changes. Tell your doctor about them. Eating and drinking     Take an ORS (oral rehydration solution). This is a drink that is sold at pharmacies and stores. Drink clear fluids in small amounts as you are able. These include: Water. Ice chips. Fruit juice that has water added (diluted fruit  juice). Low-calorie sports drinks. Eat bland, easy-to-digest foods in small amounts as you are able, such as: Bananas. Applesauce. Rice. Low-fat (lean) meats. Toast. Crackers. Avoid drinking fluids that have a lot of sugar or caffeine in them. This includes energy drinks, sports drinks, and soda. Avoid alcohol. Avoid spicy or fatty foods. General instructions Take over-the-counter and prescription medicines only as told by your doctor. Rest at home while you get better. Drink enough fluid to keep your pee (urine) pale yellow. Take slow and deep breaths when you feel like you may vomit. Avoid food or things that have strong smells. Wash your hands often with soap and water for at least 20 seconds. If you cannot use soap and water, use hand sanitizer. Make sure that everyone in your home washes their hands well and often. Keep all follow-up visits. Contact a doctor if: You feel worse. You feel like you may vomit and this lasts for more than 2 days. You vomit. You are not able to drink fluids without vomiting. You have new symptoms. You have a fever. You have a headache. You have muscle cramps. You have a rash. You have pain while peeing. You feel light-headed or dizzy. Get help right away if: You have pain in your chest, neck, arm, or jaw. You feel very weak or you faint. You have vomit that is bright red or looks like coffee grounds. You have bloody or black poop (stools) or poop that looks like tar. You have a very bad headache, a stiff neck,  or both. You have very bad pain, cramping, or bloating in your belly (abdomen). You have trouble breathing or you are breathing very quickly. Your heart is beating very quickly. Your skin feels cold and clammy. You feel confused. You have signs of losing too much water in your body, such as: Dark pee, very little pee, or no pee. Cracked lips. Dry mouth. Sunken eyes. Sleepiness. Weakness. These symptoms may be an emergency. Get  help right away. Call 911. Do not wait to see if the symptoms will go away. Do not drive yourself to the hospital. Summary Nausea is feeling like you are about vomit. If you vomit, or if you are not able to drink enough fluids, you may not have enough water in your body (get dehydrated). Eat and drink what your doctor tells you. Take over-the-counter and prescription medicines only as told by your doctor. Contact a doctor right away if your symptoms get worse or you have new symptoms. Keep all follow-up visits. This information is not intended to replace advice given to you by your health care provider. Make sure you discuss any questions you have with your health care provider. Document Revised: 09/28/2020 Document Reviewed: 09/28/2020 Elsevier Patient Education  [place].

## 2021-11-08 ENCOUNTER — Other Ambulatory Visit (HOSPITAL_COMMUNITY): Payer: Self-pay | Admitting: Family Medicine

## 2021-11-08 DIAGNOSIS — Z1231 Encounter for screening mammogram for malignant neoplasm of breast: Secondary | ICD-10-CM

## 2021-11-19 ENCOUNTER — Telehealth: Payer: Self-pay

## 2021-11-19 NOTE — Progress Notes (Signed)
Opened in wrong Context

## 2021-11-19 NOTE — Chronic Care Management (AMB) (Signed)
  Care Coordination  Note  11/19/2021 Name: Lisa Stephens MRN: [id number] DOB: April 24, 1955  Lisa Stephens is a 66 y.o. year old female who is a primary care patient of Janora Norlander, DO. I reached out to Aron Baba by phone today to offer care coordination services.      [name] was given information about Care Coordination services today including:  The Care Coordination services include support from the care team which includes your Nurse Coordinator, Clinical Social Worker, or Pharmacist.  The Care Coordination team is here to help remove barriers to the health concerns and goals most important to you. Care Coordination services are voluntary and the patient may decline or stop services at any time by request to their care team member.   Patient agreed to services and verbal consent obtained.   Follow up plan: Telephone appointment with care coordination team member scheduled for:11/29/2021  Noreene Larsson, [place], [place] [address] Direct Dial: [phone] [email or web address]

## 2021-11-19 NOTE — Chronic Care Management (AMB) (Signed)
  Care Coordination  Note  11/19/2021 Name: Rex Magee MRN: [id number] DOB: 05-22-1955  Jeydi Klingel is a 66 y.o. year old female who is a primary care patient of Janora Norlander, DO. I reached out to Aron Baba by phone today to offer care coordination services.      Ms. Pressman was given information about Care Coordination services today including:  The Care Coordination services include support from the care team which includes your Nurse Coordinator, Clinical Social Worker, or Pharmacist.  The Care Coordination team is here to help remove barriers to the health concerns and goals most important to you. Care Coordination services are voluntary and the patient may decline or stop services at any time by request to their care team member.   Patient agreed to services and verbal consent obtained.   Follow up plan: Telephone appointment with care coordination team member scheduled for:11/29/2021  Noreene Larsson, [place], [place] [address] Direct Dial: [phone] [email or web address]

## 2021-11-29 ENCOUNTER — Ambulatory Visit: Payer: Self-pay | Admitting: *Deleted

## 2021-11-29 NOTE — Patient Outreach (Signed)
  Care Coordination   11/29/2021 Name: Lisa Stephens MRN: [id number] DOB: 1955/08/27   Care Coordination Outreach Attempts:  An unsuccessful telephone outreach was attempted today to offer the patient information about available care coordination services as a benefit of their health plan.   Follow Up Plan:  Additional outreach attempts will be made to offer the patient care coordination information and services.   Encounter Outcome:  No Answer  Care Coordination Interventions Activated:  No   Care Coordination Interventions:  No, not indicated    SIG Carlyon Nolasco L. Lavina Hamman, RN, BSN, [place] Coordinator Office number [phone]

## 2021-12-06 ENCOUNTER — Encounter: Payer: Self-pay | Admitting: *Deleted

## 2021-12-06 ENCOUNTER — Ambulatory Visit: Payer: Self-pay | Admitting: *Deleted

## 2021-12-06 NOTE — Patient Instructions (Signed)
Visit Information  Thank you for taking time to visit with me today. Please don't hesitate to contact me if I can be of assistance to you.   Following are the goals we discussed today:   Goals Addressed               This Visit's Progress     Patient Stated     manage HTN (pt-stated)   Not on track     Care Coordination Interventions: Evaluation of current treatment plan related to hypertension self management and patient's adherence to plan as established by provider Reviewed medications with patient and discussed importance of compliance Discussed plans with patient for ongoing care management follow up and provided patient with direct contact information for care management team Advised patient, providing education and rationale, to monitor blood pressure daily and record, calling PCP for findings outside established parameters Assessed social determinant of health barriers Confirmed patient has a BP cuff at home Discussed importance her checking her BP prior to taking her prescribed Medicine        Our next appointment is by telephone on 03/07/22 at 1130  Please call the care guide team at [phone] if you need to cancel or reschedule your appointment.   If you are experiencing a Mental Health or [place] or need someone to talk to, please call the Suicide and Crisis Lifeline: 988 call the [place] National Suicide Prevention Lifeline: 260-398-7825 or TTY: 919-872-0849 TTY (630)446-8090) to talk to a trained counselor call 1-800-273-TALK (toll free, 24 hour hotline) call the [facility]: 337-031-6114 call 911   Patient verbalizes understanding of instructions and care plan provided today and agrees to view in [place]. Active MyChart status and patient understanding of how to access instructions and care plan via MyChart confirmed with patient.     The patient has been provided with contact information for the care management team and has  been advised to call with any health related questions or concerns.   Lisa Lavina Hamman, RN, BSN, [place] Coordinator Office number [phone]

## 2021-12-06 NOTE — Patient Outreach (Signed)
  Care Coordination   Initial Visit Note   12/06/2021 Name: Edona Schreffler MRN: [id number] DOB: Jul 08, 1955  Azeneth Carbonell is a 66 y.o. year old female who sees Janora Norlander, DO for primary care. I spoke with  Aron Baba by phone today.  What matters to the patients health and wellness today?  elevated blood pressure Diabetes doing well Last A1c = 6.2 Denies other medical and social needs   Goals Addressed               This Visit's Progress     Patient Stated     manage HTN (pt-stated)   Not on track     Care Coordination Interventions: Evaluation of current treatment plan related to hypertension self management and patient's adherence to plan as established by provider Reviewed medications with patient and discussed importance of compliance Discussed plans with patient for ongoing care management follow up and provided patient with direct contact information for care management team Advised patient, providing education and rationale, to monitor blood pressure daily and record, calling PCP for findings outside established parameters Assessed social determinant of health barriers Confirmed patient has a BP cuff at home Discussed importance her checking her BP prior to taking her prescribed Medicine        SDOH assessments and interventions completed:  Yes  SDOH Interventions Today    Flowsheet Row Most Recent Value  SDOH Interventions   Food Insecurity Interventions Intervention Not Indicated  Housing Interventions Intervention Not Indicated  Transportation Interventions Intervention Not Indicated  Utilities Interventions Intervention Not Indicated  Financial Strain Interventions Intervention Not Indicated  Stress Interventions Intervention Not Indicated  Social Connections Interventions Intervention Not Indicated        Care Coordination Interventions Activated:  Yes  Care Coordination Interventions:  Yes, provided   Follow up plan: Follow up call scheduled for  03/07/22 1130    Encounter Outcome:  Pt. Visit Completed   Saatvik Thielman L. Lavina Hamman, RN, BSN, [place] Coordinator Office number [phone]

## 2021-12-20 ENCOUNTER — Ambulatory Visit (HOSPITAL_COMMUNITY)
Admission: RE | Admit: 2021-12-20 | Discharge: 2021-12-20 | Disposition: A | Discharge status: Home / Self Care | Payer: BC Managed Care – PPO | Source: Ambulatory Visit | Attending: Family Medicine | Admitting: Family Medicine

## 2021-12-20 DIAGNOSIS — Z1231 Encounter for screening mammogram for malignant neoplasm of breast: Secondary | ICD-10-CM | POA: Diagnosis not present

## 2022-01-07 ENCOUNTER — Encounter: Payer: Self-pay | Admitting: Nurse Practitioner

## 2022-01-07 ENCOUNTER — Ambulatory Visit: Payer: BC Managed Care – PPO | Admitting: Nurse Practitioner

## 2022-01-07 ENCOUNTER — Ambulatory Visit (INDEPENDENT_AMBULATORY_CARE_PROVIDER_SITE_OTHER): Payer: BC Managed Care – PPO

## 2022-01-07 VITALS — BP 138/78 | HR 68 | Temp 98.6°F | Ht 64.0 in | Wt 192.6 lb

## 2022-01-07 DIAGNOSIS — M79651 Pain in right thigh: Secondary | ICD-10-CM

## 2022-01-07 DIAGNOSIS — M25551 Pain in right hip: Secondary | ICD-10-CM | POA: Diagnosis not present

## 2022-01-07 MED ORDER — PREDNISONE 20 MG PO TABS: 20.0000 mg | ORAL_TABLET | Freq: Every day | ORAL | 0 refills | Status: DC

## 2022-01-07 MED ORDER — METHOCARBAMOL 500 MG PO TABS: 500.0000 mg | ORAL_TABLET | Freq: Four times a day (QID) | ORAL | 1 refills | Status: DC

## 2022-01-07 MED ORDER — ACETAMINOPHEN 500 MG PO TABS: 500.0000 mg | ORAL_TABLET | Freq: Four times a day (QID) | ORAL | 0 refills | Status: AC | PRN

## 2022-01-07 NOTE — Progress Notes (Signed)
Acute Office Visit  Subjective:     Patient ID: Lisa Stephens, female    DOB: [birth year], 66 y.o.   MRN: [id number]  Chief Complaint  Patient presents with   Hip Pain    Right hip pain for about 2-3 weeks pt states it goes down her leg     Hip Pain  The incident occurred more than 1 week ago. The incident occurred at work. There was no injury mechanism. The pain is present in the right thigh. The quality of the pain is described as aching. The pain is at a severity of 8/10. The pain is severe. The pain has been Constant since onset. Associated symptoms include an inability to bear weight. Pertinent negatives include no loss of motion, loss of sensation, numbness or tingling. She reports no foreign bodies present. The symptoms are aggravated by movement and palpation.     Review of Systems  Constitutional: Negative.   HENT: Negative.    Eyes: Negative.   Respiratory: Negative.    Cardiovascular: Negative.   Genitourinary: Negative.   Musculoskeletal:  Positive for joint pain.  Skin: Negative.  Negative for itching and rash.  Neurological:  Negative for tingling and numbness.  All other systems reviewed and are negative.       Objective:    BP 138/78   Pulse 68   Temp 98.6 F (37 C)   Ht '5\' 4"'$  (1.626 m)   Wt 192 lb 9.6 oz (87.4 kg)   SpO2 96%   BMI 33.06 kg/m  BP Readings from Last 3 Encounters:  01/07/22 138/78  10/29/21 (!) 167/80  10/04/21 (!) 141/68   Wt Readings from Last 3 Encounters:  01/07/22 192 lb 9.6 oz (87.4 kg)  10/29/21 190 lb (86.2 kg)  10/04/21 194 lb 9.6 oz (88.3 kg)      Physical Exam Vitals reviewed.  Constitutional:      Appearance: Normal appearance.  HENT:     Head: Normocephalic.     Right Ear: External ear normal.     Left Ear: External ear normal.     Nose: Nose normal.     Mouth/Throat:     Mouth: Mucous membranes are moist.     Pharynx: Oropharynx is clear.  Eyes:     Conjunctiva/sclera: Conjunctivae normal.   Cardiovascular:     Rate and Rhythm: Normal rate and regular rhythm.     Pulses: Normal pulses.     Heart sounds: Normal heart sounds.  Pulmonary:     Effort: Pulmonary effort is normal.     Breath sounds: Normal breath sounds.  Abdominal:     General: Bowel sounds are normal.  Musculoskeletal:     Right hip: Tenderness present. Decreased range of motion.     Right upper leg: Tenderness present.       Legs:     Comments: Right hip/right thigh pain  Skin:    General: Skin is warm.     Findings: No rash.  Neurological:     General: No focal deficit present.     Mental Status: She is alert and oriented to person, place, and time.     No results found for any visits on 01/07/22.      Assessment & Plan:  Unresolved right hip and right thigh pain for the past 2 to 3 weeks.  Completed hip and right thigh x-ray results pending.  Apply heat as tolerated, Robaxin 500 mg tablet by mouth for musculoskeletal pain.  Prednisone 20 mg  tablet daily with breakfast for 6 days.  80 Depo-Medrol shot given in clinic.  Tylenol as needed for pain.  Follow-up with worsening or unresolved symptoms.  Patient may benefit from physical therapy and orthopedic in the future. Problem List Items Addressed This Visit   None Visit Diagnoses     Right thigh pain    -  Primary   Relevant Medications   predniSONE (DELTASONE) 20 MG tablet   acetaminophen (TYLENOL) 500 MG tablet   methocarbamol (ROBAXIN) 500 MG tablet   Other Relevant Orders   DG FEMUR, MIN 2 VIEWS RIGHT   Right hip pain       Relevant Medications   predniSONE (DELTASONE) 20 MG tablet   acetaminophen (TYLENOL) 500 MG tablet   methocarbamol (ROBAXIN) 500 MG tablet   Other Relevant Orders   DG FEMUR, MIN 2 VIEWS RIGHT       Meds ordered this encounter  Medications   predniSONE (DELTASONE) 20 MG tablet    Sig: Take 1 tablet (20 mg total) by mouth daily with breakfast.    Dispense:  6 tablet    Refill:  0    Order Specific Question:    Supervising Provider    Answer:   Jeneen Rinks   acetaminophen (TYLENOL) 500 MG tablet    Sig: Take 1 tablet (500 mg total) by mouth every 6 (six) hours as needed.    Dispense:  30 tablet    Refill:  0    Order Specific Question:   Supervising Provider    Answer:   Claretta Fraise [[id number]]   methocarbamol (ROBAXIN) 500 MG tablet    Sig: Take 1 tablet (500 mg total) by mouth 4 (four) times daily.    Dispense:  30 tablet    Refill:  1    Order Specific Question:   Supervising Provider    Answer:   Claretta Fraise [phone]    Return if symptoms worsen or fail to improve.  Ivy Lynn, NP

## 2022-01-07 NOTE — Patient Instructions (Signed)
Hamstring Strain  A hamstring strain happens when the muscles in the back of the thighs (hamstring muscles) are overstretched or torn. The hamstring muscles are used in straightening the hips, bending the knees, and pulling back the legs. This injury is often called a pulled hamstring muscle. The tissue that connects the muscle to a bone (tendon) may also be affected. The severity of a hamstring strain may be rated in degrees or grades. First-degree (or grade 1) strains have the least amount of muscle tearing and pain. Second-degree and third-degree (grade 2 and 3) strains have increasingly more tearing and pain. What are the causes? This condition is caused by a sudden, violent force being placed on the hamstring muscles, stretching them too far. This often happens during activities that involve sprinting, jumping, kicking, or weight lifting. What increases the risk? Hamstring strains are especially common in athletes. The following factors may also make you more likely to develop this condition: Having low strength, endurance, or flexibility of the hamstring muscles. Doing high-impact physical activity or sports. Having poor physical fitness. Having a previous leg injury. Having tired (fatigued) muscles. Having a previous hamstring strain. What are the signs or symptoms? Symptoms of this condition include: Pain in the back of the thigh or buttocks. Swelling. Bruising. Muscle spasms. Trouble moving the affected muscle because of pain. For severe strains, you may feel popping or snapping in the back of your thigh when the injury occurs. How is this diagnosed? This condition is diagnosed based on your symptoms, your medical history, and a physical exam. You may also have imaging tests, such as an MRI or X-rays. Your strain may be rated based on how severe it is. The ratings are: Grade 1 strain (mild). Muscles are overstretched. There may be very small muscle tears. Grade 2 strain  (moderate). Muscles are partially torn. Grade 3 strain (severe). Muscles are completely torn. How is this treated? Treatment for this condition usually involves: Protecting, resting, icing, applying compression, and elevating the injured area (PRICE therapy). Medicines. Your health care provider may recommend medicines to help reduce pain or inflammation. Doing exercises to regain strength and flexibility in the muscles. Your health care provider will tell you when it is okay to begin exercising. Hamstring strains may take a long time to heal. This type of strain may happen again in athletes. Follow your health care provider's advice about when to return to sports-related activities. Follow these instructions at home: PRICE therapy Use PRICE therapy to promote muscle healing during the first 2-3 days after your injury, or as told by your health care provider. Protect the muscle from being injured again. Rest your injury. This usually involves limiting your normal activities and not using the injured hamstring muscle. Talk with your health care provider about how you should limit your activities. Apply ice to the injured area: Put ice in a plastic bag. Place a towel between your skin and the bag. Leave the ice on for 20 minutes, 2-3 times a day. After the third day, switch to applying heat as told. Put pressure (compression) on your injured hamstring by wrapping it with an elastic bandage. Be careful not to wrap it too tightly. That may interfere with blood circulation or may increase swelling. Raise (elevate) your injured hamstring above the level of your heart as often as possible. When you are lying down, you can do this by putting a pillow under your thigh.  Activity Begin exercising or stretching only as told by your health care  provider. Do not return to full activity level until your health care provider approves. To help prevent muscle strains in the future, always warm up before  exercising and stretch afterward. General instructions Take over-the-counter and prescription medicines only as told by your health care provider. If directed, apply heat to the affected area as often as told by your health care provider. Use the heat source that your health care provider recommends, such as a moist heat pack or a heating pad. Place a towel between your skin and the heat source. Leave the heat on for 20-30 minutes. Remove the heat if your skin turns bright red. This is especially important if you are unable to feel pain, heat, or cold. You may have a greater risk of getting burned. Keep all follow-up visits. This is important. Contact a health care provider if: You have increasing pain or swelling in the injured area. You have numbness, tingling, or a significant loss of strength in the injured area. Get help right away if: Your foot or your toes become cold or turn blue. Summary A hamstring strain happens when the muscles in the back of the thighs (hamstring muscles) are overstretched or torn. This injury can be caused by a sudden, violent force being placed on the hamstring muscles, causing them to stretch too far. Symptoms include pain, swelling, and muscle spasms in the injured area. Treatment includes PRICE therapy: protecting, resting, icing, applying compression, and elevating the injured area. This information is not intended to replace advice given to you by your health care provider. Make sure you discuss any questions you have with your health care provider. Document Revised: 08/23/2020 Document Reviewed: 08/23/2020 Elsevier Patient Education  [place].

## 2022-01-16 ENCOUNTER — Other Ambulatory Visit: Payer: Self-pay | Admitting: *Deleted

## 2022-01-16 DIAGNOSIS — E039 Hypothyroidism, unspecified: Secondary | ICD-10-CM

## 2022-01-16 MED ORDER — LEVOTHYROXINE SODIUM 100 MCG PO TABS: ORAL_TABLET | ORAL | 2 refills | Status: DC

## 2022-01-20 DIAGNOSIS — Z23 Encounter for immunization: Secondary | ICD-10-CM | POA: Diagnosis not present

## 2022-03-07 ENCOUNTER — Ambulatory Visit: Payer: Self-pay | Admitting: *Deleted

## 2022-03-07 NOTE — Patient Outreach (Signed)
  Care Coordination   03/07/2022 Name: Lanee Chain MRN: [id number] DOB: [birth year]   Care Coordination Outreach Attempts:  An unsuccessful telephone outreach was attempted today to offer the patient information about available care coordination services as a benefit of their health plan.   Follow Up Plan:  Additional outreach attempts will be made to offer the patient care coordination information and services.   Encounter Outcome:  No Answer   Care Coordination Interventions:  No, not indicated     Kima Malenfant L. Lavina Hamman, RN, BSN, [place] Coordinator Office number [phone]

## 2022-03-14 ENCOUNTER — Ambulatory Visit: Payer: Self-pay | Admitting: *Deleted

## 2022-03-14 NOTE — Patient Outreach (Signed)
  Care Coordination   04/02/2022 Late entry for 03/14/22 Name: Lisa Stephens MRN: [id number] DOB: Jan 08, 1956   Care Coordination Outreach Attempts:  An unsuccessful telephone outreach was attempted today to offer the patient information about available care coordination services as a benefit of their health plan.   Follow Up Plan:  Additional outreach attempts will be made to offer the patient care coordination information and services.   Encounter Outcome:  No Answer   Care Coordination Interventions:  No, not indicated     Taylee Gunnells L. Lavina Hamman, RN, BSN, [place] Coordinator Office number [phone]

## 2022-04-02 ENCOUNTER — Encounter: Payer: Self-pay | Admitting: *Deleted

## 2022-04-02 ENCOUNTER — Other Ambulatory Visit: Payer: Self-pay | Admitting: Family Medicine

## 2022-04-02 ENCOUNTER — Ambulatory Visit: Payer: Self-pay | Admitting: *Deleted

## 2022-04-02 DIAGNOSIS — E1159 Type 2 diabetes mellitus with other circulatory complications: Secondary | ICD-10-CM

## 2022-04-02 NOTE — Patient Outreach (Signed)
  Care Coordination   Follow Up Visit Note   04/02/2022 Name: Lisa Stephens MRN: [id number] DOB: [birth year]  Lisa Stephens is a 66 y.o. year old female who sees Janora Norlander, DO for primary care. I spoke with  Aron Baba by phone today.  What matters to the patients health and wellness today?  Right thigh/hip aching pain continues from her October 2023 office visit Her pain radiates from her hip to her thigh. She reports experiencing more pain when she goes to bed and the pain decreases during the day with walking She is also restarted her aerobic exercises with some stretching- this has not resolved the pain though "Felt a pop" while at work a cart and "wonders if it is back in place" Minimal use of recommended heat pad "If I sneeze it hurts" [place] 2017 back -& right foot drop after surgery  Work in [employer], walk on concrete all day She wears Environmental health practitioner PMH diabetes Voiced Interest in possible better shoes or diabetic shoes  Methocarbamol "muscle relaxer" not resolving the pain Voice interest in another treatment plan    Discuss her results of her x ray  Assessed for bone, muscle or nerve pain  She reports she believes it is nerve pain    Diabetes good cbg 121  today -A1c in the 6's Voice interest in diabetic shoes    Goals Addressed               This Visit's Progress     Patient Stated     manage HTN (pt-stated)   On track     Care Coordination Interventions: Evaluation of current treatment plan related to hypertension self management and patient's adherence to plan as established by provider Reviewed medications with patient and discussed importance of compliance Discussed plans with patient for ongoing care management follow up and provided patient with direct contact information for care management team Advised patient, providing education and rationale, to monitor blood pressure daily and record, calling PCP for findings outside established  parameters Assessed social determinant of health barriers Confirmed patient has a BP cuff at home Discussed importance her checking her BP prior to taking her prescribed Medicine      Manage right thigh pain (THN) (pt-stated)   Not on track     Care Coordination Interventions: Reviewed provider established plan for pain management Counseled on the importance of reporting any/all new or changed pain symptoms or management strategies to pain management provider Advised patient to report to care team affect of pain on daily activities Discussed use of relaxation techniques and/or diversional activities to assist with pain reduction (distraction, imagery, relaxation, massage, acupressure, TENS, heat, and cold application Reviewed with patient prescribed pharmacological and nonpharmacological pain relief strategies Discussed wearing good fitting shoes when working on concrete floors at her job. 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 Visit Completed   Stephane Junkins L. Lavina Hamman, RN, BSN, [place] Coordinator Office number [phone]

## 2022-04-02 NOTE — Patient Instructions (Addendum)
Visit Information  Thank you for taking time to visit with me today. Please don't hesitate to contact me if I can be of assistance to you.   Following are the goals we discussed today:   Goals Addressed               This Visit's Progress     Patient Stated     manage HTN (pt-stated)   On track     Care Coordination Interventions: Evaluation of current treatment plan related to hypertension self management and patient's adherence to plan as established by provider Reviewed medications with patient and discussed importance of compliance Discussed plans with patient for ongoing care management follow up and provided patient with direct contact information for care management team Advised patient, providing education and rationale, to monitor blood pressure daily and record, calling PCP for findings outside established parameters Assessed social determinant of health barriers Confirmed patient has a BP cuff at home Discussed importance her checking her BP prior to taking her prescribed Medicine      Manage right thigh pain (THN) (pt-stated)   Not on track     Care Coordination Interventions: Reviewed provider established plan for pain management Counseled on the importance of reporting any/all new or changed pain symptoms or management strategies to pain management provider Advised patient to report to care team affect of pain on daily activities Discussed use of relaxation techniques and/or diversional activities to assist with pain reduction (distraction, imagery, relaxation, massage, acupressure, TENS, heat, and cold application Reviewed with patient prescribed pharmacological and nonpharmacological pain relief strategies Discussed wearing good fitting shoes when working on concrete floors at her job. 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   If you are experiencing a Mental Health or [place] or need someone to talk to, please call the Suicide and Crisis Lifeline: 988 call the [place] National Suicide Prevention Lifeline: 910-212-9228 or TTY: (630) 165-0593 TTY 267-643-8096) to talk to a trained counselor call 1-800-273-TALK (toll free, 24 hour hotline) call the [facility]: 614-677-9491 call 911   Patient verbalizes understanding of instructions and care plan provided today and agrees to view in [place]. Active MyChart status and patient understanding of how to access instructions and care plan via MyChart confirmed with patient.     The patient has been provided with contact information for the care management team and has been advised to call with any health related questions or concerns.    Navya Timmons L. Lavina Hamman, RN, BSN, [place] Coordinator Office number [phone]

## 2022-04-04 ENCOUNTER — Ambulatory Visit (INDEPENDENT_AMBULATORY_CARE_PROVIDER_SITE_OTHER): Payer: BC Managed Care – PPO | Admitting: Family Medicine

## 2022-04-04 ENCOUNTER — Ambulatory Visit (INDEPENDENT_AMBULATORY_CARE_PROVIDER_SITE_OTHER): Payer: BC Managed Care – PPO

## 2022-04-04 ENCOUNTER — Encounter: Payer: Self-pay | Admitting: Family Medicine

## 2022-04-04 VITALS — BP 122/60 | HR 58 | Temp 97.4°F | Resp 20 | Ht 64.0 in | Wt 190.0 lb

## 2022-04-04 DIAGNOSIS — Z0001 Encounter for general adult medical examination with abnormal findings: Secondary | ICD-10-CM | POA: Diagnosis not present

## 2022-04-04 DIAGNOSIS — Z0000 Encounter for general adult medical examination without abnormal findings: Secondary | ICD-10-CM

## 2022-04-04 DIAGNOSIS — E1159 Type 2 diabetes mellitus with other circulatory complications: Secondary | ICD-10-CM

## 2022-04-04 DIAGNOSIS — I152 Hypertension secondary to endocrine disorders: Secondary | ICD-10-CM

## 2022-04-04 DIAGNOSIS — G8929 Other chronic pain: Secondary | ICD-10-CM | POA: Diagnosis not present

## 2022-04-04 DIAGNOSIS — E1169 Type 2 diabetes mellitus with other specified complication: Secondary | ICD-10-CM

## 2022-04-04 DIAGNOSIS — M5441 Lumbago with sciatica, right side: Secondary | ICD-10-CM

## 2022-04-04 DIAGNOSIS — E669 Obesity, unspecified: Secondary | ICD-10-CM

## 2022-04-04 DIAGNOSIS — E559 Vitamin D deficiency, unspecified: Secondary | ICD-10-CM | POA: Diagnosis not present

## 2022-04-04 DIAGNOSIS — Z780 Asymptomatic menopausal state: Secondary | ICD-10-CM

## 2022-04-04 DIAGNOSIS — E785 Hyperlipidemia, unspecified: Secondary | ICD-10-CM

## 2022-04-04 DIAGNOSIS — E039 Hypothyroidism, unspecified: Secondary | ICD-10-CM

## 2022-04-04 DIAGNOSIS — Z6832 Body mass index (BMI) 32.0-32.9, adult: Secondary | ICD-10-CM

## 2022-04-04 MED ORDER — RYBELSUS 14 MG PO TABS: 14.0000 mg | ORAL_TABLET | Freq: Every day | ORAL | 4 refills | Status: DC

## 2022-04-04 MED ORDER — ATORVASTATIN CALCIUM 20 MG PO TABS: ORAL_TABLET | ORAL | 3 refills | Status: DC

## 2022-04-04 MED ORDER — AMLODIPINE-OLMESARTAN 10-40 MG PO TABS: 1.0000 | ORAL_TABLET | Freq: Every day | ORAL | 3 refills | Status: DC

## 2022-04-04 MED ORDER — LEVOTHYROXINE SODIUM 100 MCG PO TABS: ORAL_TABLET | ORAL | 3 refills | Status: DC

## 2022-04-04 NOTE — Progress Notes (Signed)
Lisa Stephens is a 66 y.o. female presents to office today for annual physical exam examination.    Concerns today include: 1. Type 2 Diabetes with hypertension, hyperlipidemia:  She reports compliance with medications.  She had labs done in October with her work and her A1c was under good control at 6.4.  LDL unfortunately was above goal at 104 with total cholesterol at 179.  Patient is compliant with her statin, Azor, Rybelsus.  Needs refills  Last eye exam: UTD 06/2021 Last foot exam: UTD Last A1c:  Lab Results  Component Value Date   HGBA1C 6.2 08/06/2021   Nephropathy screen indicated?: needs will bring back Last flu, zoster and/or pneumovax:  Immunization History  Administered Date(s) Administered   Fluad Quad(high Dose 65+) 01/20/2022   Influenza Inj Mdck Quad Pf 01/16/2021   Influenza Whole 01/23/2010, 01/27/2012, 01/25/2013, 02/02/2014, 01/18/2015   Influenza,inj,Quad PF,6+ Mos 12/26/2016   Influenza-Unspecified 01/23/2010, 01/27/2012, 01/25/2013, 02/02/2014, 01/06/2015, 01/18/2015, 01/15/2016, 12/29/2017, 01/19/2019, 01/16/2020   PFIZER(Purple Top)SARS-COV-2 Vaccination 06/21/2019, 07/12/2019, 01/17/2020   Pneumococcal Polysaccharide-23 04/07/2013, 10/16/2014   Tdap 01/25/2013   Zoster, Live 01/15/2016    ROS: No dizziness, LOC, polyuria, polydipsia, unintended weight loss/gain, foot ulcerations, numbness or tingling in extremities, shortness of breath or chest pain.  2.  Back pain Patient does report ongoing right-sided low back pain with sciatica down into the right thigh.  She describes this as an aching sensation.  She feels like she has to watch the way that she ambulates but denies any overt weakness or falls.  No saddle anesthesia, fecal incontinence or urinary retention.  She does have a history of surgical intervention back in 2017 with Dr. Suezanne Jacquet ditty in [place].  Has not reached out to him about these current symptoms.  She is status post x-ray and treatment with  methocarbamol.  Symptoms have gotten slightly better since flareup but still are present.  She is worried that this will inhibit her ability to continue working as she normally does and she wants to keep working  Diet: typical [place], Exercise: regular exercise Last eye exam: UTD Last dental exam: UTD Last colonoscopy: UTD Last mammogram: UTD Last pap smear: UTD Refills needed today: all Immunizations needed:  Immunization History  Administered Date(s) Administered   Fluad Quad(high Dose 65+) 01/20/2022   Influenza Inj Mdck Quad Pf 01/16/2021   Influenza Whole 01/23/2010, 01/27/2012, 01/25/2013, 02/02/2014, 01/18/2015   Influenza,inj,Quad PF,6+ Mos 12/26/2016   Influenza-Unspecified 01/23/2010, 01/27/2012, 01/25/2013, 02/02/2014, 01/06/2015, 01/18/2015, 01/15/2016, 12/29/2017, 01/19/2019, 01/16/2020   PFIZER(Purple Top)SARS-COV-2 Vaccination 06/21/2019, 07/12/2019, 01/17/2020   Pneumococcal Polysaccharide-23 04/07/2013, 10/16/2014   Tdap 01/25/2013   Zoster, Live 01/15/2016     Past Medical History:  Diagnosis Date   Arthritis    Blood transfusion without reported diagnosis    Cataract    small beginning   Depression    Diabetes mellitus without complication ([place])    Hyperlipidemia    Hypertension    Hypothyroidism    Social History   Socioeconomic History   Marital status: Single    Spouse name: Journalist, newspaper   Number of children: Not on file   Years of education: Not on file   Highest education level: Not on file  Occupational History   Not on file  Tobacco Use   Smoking status: Never   Smokeless tobacco: Never  Vaping Use   Vaping Use: Never used  Substance and Sexual Activity   Alcohol use: No   Drug use: No   Sexual activity: Not on  file  Other Topics Concern   Not on file  Social History Narrative   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)    Social Determinants of [place] Strain: Low Risk  (12/06/2021)   Overall Financial  Resource Strain (CARDIA)    Difficulty of Paying Living Expenses: Not hard at all  Food Insecurity: No Food Insecurity (12/06/2021)   Hunger Vital Sign    Worried About Running Out of Food in the Last Year: Never true    [place] in the Last Year: Never true  Transportation Needs: No Transportation Needs (12/06/2021)   PRAPARE - Hydrologist (Medical): No    Lack of Transportation (Non-Medical): No  Physical Activity: Not on file  Stress: No Stress Concern Present (12/06/2021)   [place]    Feeling of Stress : Not at all  Social Connections:  (12/06/2021)   Social Connection and Isolation Panel [NHANES]    Frequency of Communication with Friends and Family: More than three times a week    Frequency of Social Gatherings with Friends and Family: More than three times a week    Attends Religious Services: 1 to 4 times per year    Active Member of [employer] or Organizations: Yes    Attends Archivist Meetings: 1 to 4 times per year    Marital Status: Married  Human resources officer Violence: Not At Risk (12/06/2021)   Humiliation, Afraid, Rape, and Kick questionnaire    Fear of Current or Ex-Partner: No    Emotionally Abused: No    Physically Abused: No    Sexually Abused: No   Past Surgical History:  Procedure Laterality Date   ABDOMINAL HYSTERECTOMY     MAXIMUM ACCESS (MAS)POSTERIOR LUMBAR INTERBODY FUSION (PLIF) 2 LEVEL N/A 07/02/2015   Procedure: L3-4 L4-5 Maximum access posterior lumbar interbody fusion;  Surgeon: Kevan Ny Ditty, MD;  Location: [facility];  Service: Neurosurgery;  Laterality: N/A;  L3-4 L4-5 Maximum access posterior lumbar interbody fusion   TUBAL LIGATION     Family History  Problem Relation Age of Onset   Diabetes Mother    Hypertension Mother    Diabetes Sister    Hypertension Sister    Colon polyps Sister    Diabetes Brother    Early  death Son    Diabetes Maternal Aunt    Colon cancer Maternal Aunt    Diabetes Maternal Uncle    Esophageal cancer Neg Hx    Rectal cancer Neg Hx    Stomach cancer Neg Hx     Current Outpatient Medications:    acetaminophen (TYLENOL) 500 MG tablet, Take 1 tablet (500 mg total) by mouth every 6 (six) hours as needed., Disp: 30 tablet, Rfl: 0   amLODipine-olmesartan (AZOR) 10-40 MG tablet, TAKE 1 TABLET BY MOUTH DAILY, Disp: 90 tablet, Rfl: 0   aspirin EC 81 MG tablet, Take 81 mg by mouth daily., Disp: , Rfl:    atorvastatin (LIPITOR) 20 MG tablet, Take 1 tablet 3 days per week for cholesterol., Disp: 36 tablet, Rfl: 3   Blood Glucose Monitoring Suppl (CONTOUR NEXT MONITOR) w/Device KIT, , Disp: , Rfl:    Coenzyme Q10 (CO Q 10 PO), Take by mouth., Disp: , Rfl:    CONTOUR NEXT TEST test strip, , Disp: , Rfl:    famotidine (PEPCID) 20 MG tablet, Take 1 tablet (20 mg total) by mouth  2 (two) times daily for 14 days. For reflux, Disp: 28 tablet, Rfl: 0   glucose blood (CONTOUR NEXT TEST) test strip, as directed per package instructions In Vitro twice a day for 90 days, Disp: , Rfl:    hydrOXYzine (VISTARIL) 25 MG capsule, Take 1 capsule (25 mg total) by mouth every 8 (eight) hours as needed for itching., Disp: 30 capsule, Rfl: 0   Lancets (ONETOUCH ULTRASOFT) lancets, 1 Device by Misc.(Non-Drug; Combo Route) route daily., Disp: , Rfl:    levothyroxine (SYNTHROID) 100 MCG tablet, TAKE 1 TABLET BY MOUTH  DAILY BEFORE BREAKFAST, Disp: 90 tablet, Rfl: 2   loperamide (IMODIUM A-D) 2 MG tablet, Take 1 tablet (2 mg total) by mouth 4 (four) times daily as needed for diarrhea or loose stools., Disp: 30 tablet, Rfl: 0   loperamide (IMODIUM) 2 MG capsule, Take 2 mg by mouth 4 (four) times daily as needed., Disp: , Rfl:    meclizine (ANTIVERT) 12.5 MG tablet, Take 1 tablet (12.5 mg total) by mouth 3 (three) times daily as needed for dizziness., Disp: 30 tablet, Rfl: 0   methocarbamol (ROBAXIN) 500 MG tablet,  Take 1 tablet (500 mg total) by mouth 4 (four) times daily., Disp: 30 tablet, Rfl: 1   Omega-3 Fatty Acids (FISH OIL) 1000 MG CAPS, Take 1,000 mg by mouth daily., Disp: , Rfl:    ondansetron (ZOFRAN ODT) 4 MG disintegrating tablet, Take 1 tablet (4 mg total) by mouth every 8 (eight) hours as needed for nausea or vomiting., Disp: 20 tablet, Rfl: 0   ondansetron (ZOFRAN) 4 MG tablet, Take 1 tablet (4 mg total) by mouth every 8 (eight) hours as needed for nausea or vomiting., Disp: 20 tablet, Rfl: 0   predniSONE (DELTASONE) 20 MG tablet, Take 1 tablet (20 mg total) by mouth daily with breakfast., Disp: 6 tablet, Rfl: 0   Semaglutide (RYBELSUS) 14 MG TABS, Take 1 tablet (14 mg total) by mouth daily., Disp: 90 tablet, Rfl: 4   VITAMIN D PO, Take by mouth., Disp: , Rfl:   Allergies  Allergen Reactions   Voltaren [Diclofenac Sodium] Shortness Of Breath and Other (See Comments)    Wheezing, coughing, runny nose     ROS: Review of Systems Pertinent items noted in HPI and remainder of comprehensive ROS otherwise negative.    Physical exam BP 122/60   Pulse (!) 58   Temp (!) 97.4 F (36.3 C) (Temporal)   Resp 20   Ht _0  (1.626 m)   Wt 190 lb (86.2 kg)   SpO2 96%   BMI 32.61 kg/m  General appearance: alert, cooperative, appears stated age, and no distress Head: Normocephalic, without obvious abnormality, atraumatic Eyes: negative findings: lids and lashes normal, conjunctivae and sclerae normal, corneas clear, and pupils equal, round, reactive to light and accomodation Ears: normal TM's and external ear canals both ears Nose: Nares normal. Septum midline. Mucosa normal. No drainage or sinus tenderness. Throat: lips, mucosa, and tongue normal; teeth and gums normal Neck: no adenopathy, no carotid bruit, supple, symmetrical, trachea midline, and thyroid not enlarged, symmetric, no tenderness/mass/nodules Back: symmetric, no curvature. ROM normal. No CVA tenderness.,  Negative straight leg  raise Lungs: clear to auscultation bilaterally Heart: regular rate and rhythm, S1, S2 normal, no murmur, click, rub or gallop Abdomen: soft, non-tender; bowel sounds normal; no masses,  no organomegaly Extremities: extremities normal, atraumatic, no cyanosis or edema Pulses: 2+ and symmetric Skin: Skin color, texture, turgor normal. No rashes or lesions Lymph nodes:  Cervical, supraclavicular, and axillary nodes normal. Neurologic: Grossly normal Psych: Mood stable, speech normal, affect appropriate     04/04/2022   10:57 AM 01/07/2022    9:17 AM 12/06/2021    4:36 PM  Depression screen PHQ 2/9  Decreased Interest 0 0 0  Down, Depressed, Hopeless 0 0 0  PHQ - 2 Score 0 0 0  Altered sleeping 0    Tired, decreased energy 0    Change in appetite 0    Feeling bad or failure about yourself  0    Trouble concentrating 0    Moving slowly or fidgety/restless 0    Suicidal thoughts 0    PHQ-9 Score 0    Difficult doing work/chores Not difficult at all        04/04/2022   10:58 AM 01/07/2022    9:17 AM 10/04/2021    8:30 AM 12/03/2020   11:58 AM  GAD 7 : Generalized Anxiety Score  Nervous, Anxious, on Edge 1 0 0 0  Control/stop worrying 1 0 0 0  Worry too much - different things 1 0 1 0  Trouble relaxing 0 0 0 0  Restless 0 0 0 0  Easily annoyed or irritable 0 0 0 0  Afraid - awful might happen 0 0 0 0  Total GAD 7 Score 3 0 1 0  Anxiety Difficulty Not difficult at all  Not difficult at all Not difficult at all   Assessment/ Plan: Aron Baba here for annual physical exam.   Annual physical exam  Diabetes mellitus type 2 in obese ([place]) - Plan: Microalbumin / creatinine urine ratio, Semaglutide (RYBELSUS) 14 MG TABS, CANCELED: Bayer DCA Hb A1c Waived  Hypertension associated with diabetes ([place]) - Plan: amLODipine-olmesartan (AZOR) 10-40 MG tablet  Hyperlipidemia associated with type 2 diabetes mellitus ([facility]) - Plan: atorvastatin (LIPITOR) 20 MG tablet  Acquired hypothyroidism  - Plan: levothyroxine (SYNTHROID) 100 MCG tablet, CANCELED: TSH, CANCELED: T4, Free  Vitamin D deficiency - Plan: DG [facility] DEXA  Asymptomatic postmenopausal estrogen deficiency - Plan: DG [facility] DEXA  Chronic right-sided low back pain with right-sided sciatica - Plan: Ambulatory referral to Physical Therapy  Up-to-date on vaccinations.  We performed DEXA scan today.  Could not leave urine microalbumin but will bring this back at a later date.  A1c was well-controlled so this was not repeated today.  Rybelsus renewed.  Blood pressure controlled.  Continue current regimen  Cholesterol not at goal but she should continue statin  Thyroid levels were within normal range so Synthroid renewed  Referral to physical therapy placed.  We will plan for MRI and referral back to her back surgeon if symptoms do not improve with this.  She will contact me if this is needed  Counseled on healthy lifestyle choices, including diet (rich in fruits, vegetables and lean meats and low in salt and simple carbohydrates) and exercise (at least 30 minutes of moderate physical activity daily).  Patient to follow up in 98mfor DM/ thyroid  Lyah Millirons M. GLajuana Ripple DO

## 2022-04-04 NOTE — Patient Instructions (Signed)

## 2022-04-09 ENCOUNTER — Other Ambulatory Visit: Payer: BC Managed Care – PPO

## 2022-04-09 DIAGNOSIS — E669 Obesity, unspecified: Secondary | ICD-10-CM | POA: Diagnosis not present

## 2022-04-09 DIAGNOSIS — E1169 Type 2 diabetes mellitus with other specified complication: Secondary | ICD-10-CM | POA: Diagnosis not present

## 2022-04-10 LAB — MICROALBUMIN / CREATININE URINE RATIO
Creatinine, Urine: 27.5 mg/dL
Microalb/Creat Ratio: <11 mg/g creat (ref 0–29)
Microalbumin, Urine: <3 ug/mL

## 2022-04-14 ENCOUNTER — Telehealth: Payer: Self-pay | Admitting: *Deleted

## 2022-04-14 NOTE — Progress Notes (Signed)
  Care Coordination Note  04/14/2022 Name: Lisa Stephens MRN: [id number] DOB: 1955/12/24  Lisa Stephens is a 67 y.o. year old female who is a primary care patient of Janora Norlander, DO and is actively engaged with the care management team. I reached out to Aron Baba by phone today to assist with re-scheduling a follow up visit with the RN Case Manager  Follow up plan: Unsuccessful telephone outreach attempt made. A HIPAA compliant phone message was left for the patient providing contact information and requesting a return call.   [place]  Direct Dial: [phone]

## 2022-04-17 ENCOUNTER — Telehealth: Payer: Self-pay

## 2022-04-17 NOTE — Progress Notes (Signed)
  Care Coordination Note  04/17/2022 Name: Lisa Stephens MRN: [id number] DOB: Sep 21, 1955  Lisa Stephens is a 67 y.o. year old female who is a primary care patient of Janora Norlander, DO and is actively engaged with the care management team. I reached out to Aron Baba by phone today to assist with re-scheduling a follow up visit with the RN Case Manager  Follow up plan: Unsuccessful telephone outreach attempt made. A HIPAA compliant phone message was left for the patient providing contact information and requesting a return call.   [place]  Direct Dial: [phone]

## 2022-04-17 NOTE — Telephone Encounter (Signed)
[place] (Key: [id number]) Rybelsus '7MG'$  tablets   Form [employer] Electronic Prior Authorization Form (2017 NCPDP) Created 13 days ago Sent to Plan 4 minutes ago Plan Response 3 minutes ago Submit Clinical Questions less than a minute ago Determination Wait for Determination Please wait for [employer] 2017 NCPDP to return a determination.

## 2022-04-18 ENCOUNTER — Ambulatory Visit: Payer: BC Managed Care – PPO | Attending: Family Medicine

## 2022-04-18 ENCOUNTER — Other Ambulatory Visit: Payer: Self-pay

## 2022-04-18 ENCOUNTER — Encounter: Payer: Self-pay | Admitting: *Deleted

## 2022-04-18 DIAGNOSIS — M5416 Radiculopathy, lumbar region: Secondary | ICD-10-CM | POA: Diagnosis not present

## 2022-04-18 DIAGNOSIS — M5441 Lumbago with sciatica, right side: Secondary | ICD-10-CM | POA: Diagnosis not present

## 2022-04-18 DIAGNOSIS — G8929 Other chronic pain: Secondary | ICD-10-CM | POA: Insufficient documentation

## 2022-04-18 NOTE — Therapy (Signed)
OUTPATIENT PHYSICAL THERAPY THORACOLUMBAR EVALUATION   Patient Name: Lisa Stephens MRN: [id number] DOB:[birth year], 67 y.o., female Today's Date: 04/18/2022  END OF SESSION:  PT End of Session - 04/18/22 0903     Visit Number 1    Number of Visits 4    Date for PT Re-Evaluation 05/30/22    PT Start Time 0904    PT Stop Time 0938    PT Time Calculation (min) 34 min    Activity Tolerance Patient tolerated treatment well    Behavior During Therapy WFL for tasks assessed/performed             Past Medical History:  Diagnosis Date   Arthritis    Blood transfusion without reported diagnosis    Cataract    small beginning   Depression    Diabetes mellitus without complication ([place])    Hyperlipidemia    Hypertension    Hypothyroidism    Past Surgical History:  Procedure Laterality Date   ABDOMINAL HYSTERECTOMY     MAXIMUM ACCESS (MAS)POSTERIOR LUMBAR INTERBODY FUSION (PLIF) 2 LEVEL N/A 07/02/2015   Procedure: L3-4 L4-5 Maximum access posterior lumbar interbody fusion;  Surgeon: Kevan Ny Ditty, MD;  Location: [facility];  Service: Neurosurgery;  Laterality: N/A;  L3-4 L4-5 Maximum access posterior lumbar interbody fusion   TUBAL LIGATION     Patient Active Problem List   Diagnosis Date Noted   Rotator cuff tendinitis, right 04/04/2020   Fracture of distal end of right radius 08/21/2017   Foot drop, right 09/26/2015   Stress incontinence in female 08/23/2015   Hearing loss 08/23/2015   Lumbosacral spondylosis with radiculopathy 07/02/2015   Acute low back pain with sciatica 05/29/2015   Hypothyroidism 05/28/2015   Vitamin D deficiency 05/28/2015   Hypertension associated with diabetes ([place]) 12/08/2013   Obesity 12/08/2013   Hyperlipidemia associated with type 2 diabetes mellitus ([place]) 09/07/2013   Diabetes mellitus type 2 in obese ([place]) 09/07/2013   REFERRING PROVIDER: Janora Norlander, DO   REFERRING DIAG: Chronic right-sided low back pain with right-sided  sciatica   Rationale for Evaluation and Treatment: Rehabilitation  THERAPY DIAG:  Radiculopathy, lumbar region  ONSET DATE: September 2023  SUBJECTIVE:                                                                                                                                                                                           SUBJECTIVE STATEMENT: Patient reports that she began having back and right thigh pain that began in September 2023. She had a x-ray which did not show anything, per her reports. She notes that she felt a pop  while at work shortly after her x-ray. She reports that she has never had any pain like this before.   PERTINENT HISTORY:  Diabetes, hearing loss, arthritis, depression, hypertension, and history of lumbar surgery (2017)  PAIN:  Are you having pain? Yes: NPRS scale: 3/10 Pain location: back and right thigh Pain description: dull, aching, varies throughout the day, throbbing Aggravating factors: sitting (15-25 minutes at most), laying down at night  Relieving factors: topical cream  PRECAUTIONS: None  WEIGHT BEARING RESTRICTIONS: No  FALLS:  Has patient fallen in last 6 months? No  LIVING ENVIRONMENT: Lives with: lives with their spouse Lives in: House/apartment Stairs: Yes: Internal: 2 steps; none Has following equipment at home: None  OCCUPATION: Designer, multimedia; has to be able to push, pull, and bending  PLOF: Independent  PATIENT GOALS: reduced pain and be able to exercise more  NEXT MD VISIT: 10/17/22  OBJECTIVE:   PATIENT SURVEYS:  FOTO 72.42  SCREENING FOR RED FLAGS: Bowel or bladder incontinence: No Spinal tumors: No Cauda equina syndrome: No Compression fracture: No Abdominal aneurysm: No  COGNITION: Overall cognitive status: Within functional limits for tasks assessed     SENSATION: Patient reports no numbness or tingling  PALPATION: TTP: right quadriceps and IT band  LUMBAR ROM:   AROM eval  Flexion 62   Extension 28  Right lateral flexion 50% limited; familiar pain  Left lateral flexion 50% limited; "tight"  Right rotation 50% limited; familiar pain   Left rotation 25% limited   (Blank rows = not tested)  LOWER EXTREMITY ROM: WFL for activities assessed   LOWER EXTREMITY MMT:    MMT Right eval Left eval  Hip flexion 4/5 4/5  Hip extension    Hip abduction    Hip adduction    Hip internal rotation    Hip external rotation    Knee flexion 5/5 5/5  Knee extension 4/5 4+/5  Ankle dorsiflexion 4-/5 4-/5  Ankle plantarflexion    Ankle inversion    Ankle eversion     (Blank rows = not tested)  LUMBAR SPECIAL TESTS:  Straight leg raise test: Negative  GAIT: Assistive device utilized: None Level of assistance: Complete Independence Comments: wide BOS, bilateral hip ER, poor foot clearance, and decreased stride length and gait speed  TODAY'S TREATMENT:                                                                                                                              DATE:     PATIENT EDUCATION:  Education details: POC, healing, prognosis, and goals for therapy Person educated: Patient Education method: Explanation Education comprehension: verbalized understanding  HOME EXERCISE PROGRAM:   ASSESSMENT:  CLINICAL IMPRESSION: Patient is a 67 y.o. female who was seen today for physical therapy evaluation and treatment for low back pain radiating into her right thigh.  She presented with low pain severity and irritability with right lumbar rotation and sidebending reproducing her familiar pain.  Recommend that she continue with skilled physical therapy to address her impairments to safely return to her prior level of function.  OBJECTIVE IMPAIRMENTS: Abnormal gait, decreased activity tolerance, decreased ROM, decreased strength, impaired flexibility, and pain.   ACTIVITY LIMITATIONS: sitting and sleeping  PARTICIPATION LIMITATIONS: occupation  PERSONAL FACTORS:  Profession, Time since onset of injury/illness/exacerbation, and 3+ comorbidities: Diabetes, hearing loss, arthritis, depression, hypertension, and history of lumbar surgery (2017)  are also affecting patient's functional outcome.   REHAB POTENTIAL: Good  CLINICAL DECISION MAKING: Evolving/moderate complexity  EVALUATION COMPLEXITY: Moderate   GOALS: Goals reviewed with patient? Yes  LONG TERM GOALS: Target date: 05/16/22  Patient will be independent with her HEP.  Baseline:  Goal status: INITIAL  2.  Patient will be able to complete her daily activities without her familiar low back and right lower extremity pain exceeding 1/10. Baseline:  Goal status: INITIAL  3.  Patient will report being able to sit for at least 45 minutes without being limited by her familiar symptoms for improved function with her critical job demands. Baseline:  Goal status: INITIAL  4.  Patient will be able to demonstrate equal lumbar rotation bilaterally without reproducing her familiar pain for improved function with her daily activities. Baseline:  Goal status: INITIAL  PLAN:  PT FREQUENCY: 1x/week; patient is only able to attend therapy once per week due to her work schedule  PT DURATION: 4 weeks  PLANNED INTERVENTIONS: Therapeutic exercises, Therapeutic activity, Neuromuscular re-education, Balance training, Gait training, Patient/Family education, Self Care, Joint mobilization, Dry Needling, Electrical stimulation, Spinal mobilization, Cryotherapy, Moist heat, Vasopneumatic device, Traction, Manual therapy, and Re-evaluation.  PLAN FOR NEXT SESSION: nustep, LTR, bridges, SLR, lower extremity strengthening, and modalities as needed   Darlin Coco, PT 04/18/2022, 1:03 PM

## 2022-04-18 NOTE — Telephone Encounter (Signed)
Patient Advocate Encounter  Prior Authorization for [employer] '7MG'$  tablets has been approved.    PA# [id number] Effective dates: 04/17/22 through 04/18/23

## 2022-04-21 NOTE — Progress Notes (Signed)
  Care Coordination Note  04/21/2022 Name: [name] MRN: [id number] DOB: Jun 27, 1955  Lisa Stephens is a 67 y.o. year old female who is a primary care patient of Janora Norlander, DO and is actively engaged with the care management team. I reached out to Aron Baba by phone today to assist with re-scheduling a follow up visit with the RN Case Manager  Follow up plan: A third unsuccessful telephone outreach attempt made. A HIPAA compliant phone message was left for the patient providing contact information and requesting a return call. We have been unable to make contact with the patient for follow up. The care management team is available to follow up with the patient after provider conversation with the patient regarding recommendation for care management engagement and subsequent re-referral to the care management team.   [place]  Direct Dial: [phone]

## 2022-04-25 ENCOUNTER — Ambulatory Visit: Payer: BC Managed Care – PPO

## 2022-04-25 DIAGNOSIS — G8929 Other chronic pain: Secondary | ICD-10-CM | POA: Diagnosis not present

## 2022-04-25 DIAGNOSIS — M5416 Radiculopathy, lumbar region: Secondary | ICD-10-CM | POA: Diagnosis not present

## 2022-04-25 DIAGNOSIS — M5441 Lumbago with sciatica, right side: Secondary | ICD-10-CM | POA: Diagnosis not present

## 2022-04-25 NOTE — Therapy (Signed)
OUTPATIENT PHYSICAL THERAPY THORACOLUMBAR TREATMENT   Patient Name: Lisa Stephens MRN: [id number] DOB:[birth year], 67 y.o., female Today's Date: 04/25/2022  END OF SESSION:  PT End of Session - 04/25/22 1113     Visit Number 2    Number of Visits 4    Date for PT Re-Evaluation 05/30/22    PT Start Time 1115    PT Stop Time 1158    PT Time Calculation (min) 43 min    Activity Tolerance Patient tolerated treatment well    Behavior During Therapy WFL for tasks assessed/performed              Past Medical History:  Diagnosis Date   Arthritis    Blood transfusion without reported diagnosis    Cataract    small beginning   Depression    Diabetes mellitus without complication ([place])    Hyperlipidemia    Hypertension    Hypothyroidism    Past Surgical History:  Procedure Laterality Date   ABDOMINAL HYSTERECTOMY     MAXIMUM ACCESS (MAS)POSTERIOR LUMBAR INTERBODY FUSION (PLIF) 2 LEVEL N/A 07/02/2015   Procedure: L3-4 L4-5 Maximum access posterior lumbar interbody fusion;  Surgeon: Kevan Ny Ditty, MD;  Location: [facility];  Service: Neurosurgery;  Laterality: N/A;  L3-4 L4-5 Maximum access posterior lumbar interbody fusion   TUBAL LIGATION     Patient Active Problem List   Diagnosis Date Noted   Rotator cuff tendinitis, right 04/04/2020   Fracture of distal end of right radius 08/21/2017   Foot drop, right 09/26/2015   Stress incontinence in female 08/23/2015   Hearing loss 08/23/2015   Lumbosacral spondylosis with radiculopathy 07/02/2015   Acute low back pain with sciatica 05/29/2015   Hypothyroidism 05/28/2015   Vitamin D deficiency 05/28/2015   Hypertension associated with diabetes ([place]) 12/08/2013   Obesity 12/08/2013   Hyperlipidemia associated with type 2 diabetes mellitus ([place]) 09/07/2013   Diabetes mellitus type 2 in obese ([place]) 09/07/2013   REFERRING PROVIDER: Janora Norlander, DO   REFERRING DIAG: Chronic right-sided low back pain with right-sided  sciatica   Rationale for Evaluation and Treatment: Rehabilitation  THERAPY DIAG:  Radiculopathy, lumbar region  ONSET DATE: September 2023  SUBJECTIVE:                                                                                                                                                                                           SUBJECTIVE STATEMENT: Patient reports that she feels a lot better than she has been. She notes that she has been exercising regularly and that is helping. She was able to work all four days this week  without being limited.   PERTINENT HISTORY:  Diabetes, hearing loss, arthritis, depression, hypertension, and history of lumbar surgery (2017)  PAIN:  Are you having pain? Yes: NPRS scale: 3/10 Pain location: back and right thigh Pain description: dull, aching, varies throughout the day, throbbing Aggravating factors: sitting (15-25 minutes at most), laying down at night  Relieving factors: topical cream  PRECAUTIONS: None  WEIGHT BEARING RESTRICTIONS: No  FALLS:  Has patient fallen in last 6 months? No  LIVING ENVIRONMENT: Lives with: lives with their spouse Lives in: House/apartment Stairs: Yes: Internal: 2 steps; none Has following equipment at home: None  OCCUPATION: Designer, multimedia; has to be able to push, pull, and bending  PLOF: Independent  PATIENT GOALS: reduced pain and be able to exercise more  NEXT MD VISIT: 10/17/22  OBJECTIVE:   PATIENT SURVEYS:  FOTO 72.42  SCREENING FOR RED FLAGS: Bowel or bladder incontinence: No Spinal tumors: No Cauda equina syndrome: No Compression fracture: No Abdominal aneurysm: No  COGNITION: Overall cognitive status: Within functional limits for tasks assessed     SENSATION: Patient reports no numbness or tingling  PALPATION: TTP: right quadriceps and IT band  LUMBAR ROM:   AROM eval  Flexion 62  Extension 28  Right lateral flexion 50% limited; familiar pain  Left lateral  flexion 50% limited; "tight"  Right rotation 50% limited; familiar pain   Left rotation 25% limited   (Blank rows = not tested)  LOWER EXTREMITY ROM: WFL for activities assessed   LOWER EXTREMITY MMT:    MMT Right eval Left eval  Hip flexion 4/5 4/5  Hip extension    Hip abduction    Hip adduction    Hip internal rotation    Hip external rotation    Knee flexion 5/5 5/5  Knee extension 4/5 4+/5  Ankle dorsiflexion 4-/5 4-/5  Ankle plantarflexion    Ankle inversion    Ankle eversion     (Blank rows = not tested)  LUMBAR SPECIAL TESTS:  Straight leg raise test: Negative  GAIT: Assistive device utilized: None Level of assistance: Complete Independence Comments: wide BOS, bilateral hip ER, poor foot clearance, and decreased stride length and gait speed  TODAY'S TREATMENT:                                                                                                                              DATE:                                     1/19 EXERCISE LOG  Exercise Repetitions and Resistance Comments  Nustep  L3 x 15 minutes   Lower trunk rotation 2 minutes Added to HEP  Supine hip ADD isometric 2.5 minutes w/ 5 second hold  Added to HEP   Bridge 30 reps   Rocker board  4 minutes   Marching on [employer] up;  3 minutes BUE support  LAQ 4# x 2 minutes  Alternating LE    Blank cell = exercise not performed today   PATIENT EDUCATION:  Education details: POC, healing, prognosis, and goals for therapy Person educated: Patient Education method: Explanation Education comprehension: verbalized understanding  HOME EXERCISE PROGRAM:   ASSESSMENT:  CLINICAL IMPRESSION: Patient was introduced to multiple new interventions for improved lumbar and lower extremity strength difficulty.  She required minimal cueing with today's new interventions for proper placement to avoid a significant increase in lower extremity fatigue.  She experienced an mild increase in fatigue and  soreness with today's intervention.  However, this did not limit her ability to complete any of today's interventions.  Her home exercise program was updated to include lower trunk rotation and supine hip abduction will reduce pain.  She reported feeling good upon conclusion of treatment.  Recommend that she continue with skilled physical therapy to address her remaining impairments to return to her prior level of function.  OBJECTIVE IMPAIRMENTS: Abnormal gait, decreased activity tolerance, decreased ROM, decreased strength, impaired flexibility, and pain.   ACTIVITY LIMITATIONS: sitting and sleeping  PARTICIPATION LIMITATIONS: occupation  PERSONAL FACTORS: Profession, Time since onset of injury/illness/exacerbation, and 3+ comorbidities: Diabetes, hearing loss, arthritis, depression, hypertension, and history of lumbar surgery (2017)  are also affecting patient's functional outcome.   REHAB POTENTIAL: Good  CLINICAL DECISION MAKING: Evolving/moderate complexity  EVALUATION COMPLEXITY: Moderate   GOALS: Goals reviewed with patient? Yes  LONG TERM GOALS: Target date: 05/16/22  Patient will be independent with her HEP.  Baseline:  Goal status: INITIAL  2.  Patient will be able to complete her daily activities without her familiar low back and right lower extremity pain exceeding 1/10. Baseline:  Goal status: INITIAL  3.  Patient will report being able to sit for at least 45 minutes without being limited by her familiar symptoms for improved function with her critical job demands. Baseline:  Goal status: INITIAL  4.  Patient will be able to demonstrate equal lumbar rotation bilaterally without reproducing her familiar pain for improved function with her daily activities. Baseline:  Goal status: INITIAL  PLAN:  PT FREQUENCY: 1x/week; patient is only able to attend therapy once per week due to her work schedule  PT DURATION: 4 weeks  PLANNED INTERVENTIONS: Therapeutic exercises,  Therapeutic activity, Neuromuscular re-education, Balance training, Gait training, Patient/Family education, Self Care, Joint mobilization, Dry Needling, Electrical stimulation, Spinal mobilization, Cryotherapy, Moist heat, Vasopneumatic device, Traction, Manual therapy, and Re-evaluation.  PLAN FOR NEXT SESSION: nustep, LTR, bridges, SLR, lower extremity strengthening, and modalities as needed   Darlin Coco, PT 04/25/2022, 12:36 PM

## 2022-05-02 ENCOUNTER — Ambulatory Visit: Payer: BC Managed Care – PPO | Admitting: *Deleted

## 2022-05-02 ENCOUNTER — Encounter: Payer: Self-pay | Admitting: *Deleted

## 2022-05-02 DIAGNOSIS — M5416 Radiculopathy, lumbar region: Secondary | ICD-10-CM | POA: Diagnosis not present

## 2022-05-02 DIAGNOSIS — M5441 Lumbago with sciatica, right side: Secondary | ICD-10-CM | POA: Diagnosis not present

## 2022-05-02 DIAGNOSIS — G8929 Other chronic pain: Secondary | ICD-10-CM | POA: Diagnosis not present

## 2022-05-02 NOTE — Therapy (Signed)
OUTPATIENT PHYSICAL THERAPY THORACOLUMBAR TREATMENT   Patient Name: Lisa Stephens MRN: [id number] DOB:1955/09/24, 67 y.o., female Today's Date: 05/02/2022  END OF SESSION:  PT End of Session - 05/02/22 1119     Visit Number 3    Number of Visits 4    Date for PT Re-Evaluation 05/30/22    PT Start Time 1115    PT Stop Time 1202    PT Time Calculation (min) 47 min              Past Medical History:  Diagnosis Date   Arthritis    Blood transfusion without reported diagnosis    Cataract    small beginning   Depression    Diabetes mellitus without complication ([place])    Hyperlipidemia    Hypertension    Hypothyroidism    Past Surgical History:  Procedure Laterality Date   ABDOMINAL HYSTERECTOMY     MAXIMUM ACCESS (MAS)POSTERIOR LUMBAR INTERBODY FUSION (PLIF) 2 LEVEL N/A 07/02/2015   Procedure: L3-4 L4-5 Maximum access posterior lumbar interbody fusion;  Surgeon: Kevan Ny Ditty, MD;  Location: [facility];  Service: Neurosurgery;  Laterality: N/A;  L3-4 L4-5 Maximum access posterior lumbar interbody fusion   TUBAL LIGATION     Patient Active Problem List   Diagnosis Date Noted   Rotator cuff tendinitis, right 04/04/2020   Fracture of distal end of right radius 08/21/2017   Foot drop, right 09/26/2015   Stress incontinence in female 08/23/2015   Hearing loss 08/23/2015   Lumbosacral spondylosis with radiculopathy 07/02/2015   Acute low back pain with sciatica 05/29/2015   Hypothyroidism 05/28/2015   Vitamin D deficiency 05/28/2015   Hypertension associated with diabetes ([place]) 12/08/2013   Obesity 12/08/2013   Hyperlipidemia associated with type 2 diabetes mellitus ([place]) 09/07/2013   Diabetes mellitus type 2 in obese ([place]) 09/07/2013   REFERRING PROVIDER: Janora Norlander, DO   REFERRING DIAG: Chronic right-sided low back pain with right-sided sciatica   Rationale for Evaluation and Treatment: Rehabilitation  THERAPY DIAG:  Radiculopathy, lumbar  region  ONSET DATE: September 2023  SUBJECTIVE:                                                                                                                                                                                           SUBJECTIVE STATEMENT: Patient reports that she feels a lot better than she has been. She notes that she has been exercising regularly and that is helping. She reports doing walking program at home   PERTINENT HISTORY:  Diabetes, hearing loss, arthritis, depression, hypertension, and history of lumbar surgery (2017)  PAIN:  Are you  having pain? Yes: NPRS scale: 1-2/10 Pain location: back and right thigh Pain description: dull, aching, varies throughout the day, throbbing Aggravating factors: sitting (15-25 minutes at most), laying down at night  Relieving factors: topical cream  PRECAUTIONS: None  WEIGHT BEARING RESTRICTIONS: No  FALLS:  Has patient fallen in last 6 months? No  LIVING ENVIRONMENT: Lives with: lives with their spouse Lives in: House/apartment Stairs: Yes: Internal: 2 steps; none Has following equipment at home: None  OCCUPATION: Designer, multimedia; has to be able to push, pull, and bending  PLOF: Independent  PATIENT GOALS: reduced pain and be able to exercise more  NEXT MD VISIT: 10/17/22  OBJECTIVE:   PATIENT SURVEYS:  FOTO 72.42  SCREENING FOR RED FLAGS: Bowel or bladder incontinence: No Spinal tumors: No Cauda equina syndrome: No Compression fracture: No Abdominal aneurysm: No  COGNITION: Overall cognitive status: Within functional limits for tasks assessed     SENSATION: Patient reports no numbness or tingling  PALPATION: TTP: right quadriceps and IT band  LUMBAR ROM:   AROM eval  Flexion 62  Extension 28  Right lateral flexion 50% limited; familiar pain  Left lateral flexion 50% limited; "tight"  Right rotation 50% limited; familiar pain   Left rotation 25% limited   (Blank rows = not  tested)  LOWER EXTREMITY ROM: WFL for activities assessed   LOWER EXTREMITY MMT:    MMT Right eval Left eval  Hip flexion 4/5 4/5  Hip extension    Hip abduction    Hip adduction    Hip internal rotation    Hip external rotation    Knee flexion 5/5 5/5  Knee extension 4/5 4+/5  Ankle dorsiflexion 4-/5 4-/5  Ankle plantarflexion    Ankle inversion    Ankle eversion     (Blank rows = not tested)  LUMBAR SPECIAL TESTS:  Straight leg raise test: Negative  GAIT: Assistive device utilized: None Level of assistance: Complete Independence Comments: wide BOS, bilateral hip ER, poor foot clearance, and decreased stride length and gait speed  TODAY'S TREATMENT:                                                                                                                              DATE:                                     1/26                                     EXERCISE LOG  Exercise Repetitions and Resistance Comments  Nustep  L3 x 15 minutes   Lower trunk rotation  Added to HEP  Supine hip ADD isometric 2.5 minutes w/ 5 second hold  Added to [employer]  x10 reps   Rocker board  4 minutes   Marching on BOSU Ball up; 3 minutes BUE support  LAQ 4# x 2 minutes Alternating LE   XTS Blue Shldr Ext 2x10   XTS Blue Rows 4x10 Alternate  Feet    Blank cell = exercise not performed today   PATIENT EDUCATION:  Education details: POC, healing, prognosis, and goals for therapy Person educated: Patient Education method: Explanation Education comprehension: verbalized understanding  HOME EXERCISE PROGRAM:   ASSESSMENT:  CLINICAL IMPRESSION: Pt arrived today doing fairly well with low pain levels and was able to continue with core and LE strengthening Exercises. She did well with added XTS exercises with mainly fatigue. Pt able to meet one LTG and progress towards others. Great job today.        OBJECTIVE IMPAIRMENTS: Abnormal gait, decreased activity tolerance,  decreased ROM, decreased strength, impaired flexibility, and pain.   ACTIVITY LIMITATIONS: sitting and sleeping  PARTICIPATION LIMITATIONS: occupation  PERSONAL FACTORS: Profession, Time since onset of injury/illness/exacerbation, and 3+ comorbidities: Diabetes, hearing loss, arthritis, depression, hypertension, and history of lumbar surgery (2017)  are also affecting patient's functional outcome.   REHAB POTENTIAL: Good  CLINICAL DECISION MAKING: Evolving/moderate complexity  EVALUATION COMPLEXITY: Moderate   GOALS: Goals reviewed with patient? Yes  LONG TERM GOALS: Target date: 05/16/22  Patient will be independent with her HEP.  Baseline:  Goal status: Partially met  2.  Patient will be able to complete her daily activities without her familiar low back and right lower extremity pain exceeding 1/10. Baseline:  Goal status: Partially Met     5/10  3.  Patient will report being able to sit for at least 45 minutes without being limited by her familiar symptoms for improved function with her critical job demands. Baseline:  Goal status: MET  4.  Patient will be able to demonstrate equal lumbar rotation bilaterally without reproducing her familiar pain for improved function with her daily activities. Baseline:  Goal status: Partially Met  PLAN:  PT FREQUENCY: 1x/week; patient is only able to attend therapy once per week due to her work schedule  PT DURATION: 4 weeks  PLANNED INTERVENTIONS: Therapeutic exercises, Therapeutic activity, Neuromuscular re-education, Balance training, Gait training, Patient/Family education, Self Care, Joint mobilization, Dry Needling, Electrical stimulation, Spinal mobilization, Cryotherapy, Moist heat, Vasopneumatic device, Traction, Manual therapy, and Re-evaluation.  PLAN FOR NEXT SESSION: nustep,, bridges, SLR, lower extremity strengthening, and modalities as needed   Timoth Schara,CHRIS, PTA 05/02/2022, 12:06 PM

## 2022-05-09 ENCOUNTER — Ambulatory Visit: Payer: BC Managed Care – PPO | Attending: Family Medicine | Admitting: Physical Therapy

## 2022-05-09 ENCOUNTER — Encounter: Payer: Self-pay | Admitting: Physical Therapy

## 2022-05-09 DIAGNOSIS — M5416 Radiculopathy, lumbar region: Secondary | ICD-10-CM | POA: Diagnosis not present

## 2022-05-09 NOTE — Therapy (Addendum)
OUTPATIENT PHYSICAL THERAPY THORACOLUMBAR TREATMENT   Patient Name: Lisa Stephens MRN: [id number] DOB:[birth year], 67 y.o., female Today's Date: 05/09/2022  END OF SESSION:  PT End of Session - 05/09/22 0825     Visit Number 4    Number of Visits 4    Date for PT Re-Evaluation 05/30/22    PT Start Time 0816    PT Stop Time 0858    PT Time Calculation (min) 42 min    Activity Tolerance Patient tolerated treatment well    Behavior During Therapy WFL for tasks assessed/performed            Past Medical History:  Diagnosis Date   Arthritis    Blood transfusion without reported diagnosis    Cataract    small beginning   Depression    Diabetes mellitus without complication ([place])    Hyperlipidemia    Hypertension    Hypothyroidism    Past Surgical History:  Procedure Laterality Date   ABDOMINAL HYSTERECTOMY     MAXIMUM ACCESS (MAS)POSTERIOR LUMBAR INTERBODY FUSION (PLIF) 2 LEVEL N/A 07/02/2015   Procedure: L3-4 L4-5 Maximum access posterior lumbar interbody fusion;  Surgeon: Kevan Ny Ditty, MD;  Location: [facility];  Service: Neurosurgery;  Laterality: N/A;  L3-4 L4-5 Maximum access posterior lumbar interbody fusion   TUBAL LIGATION     Patient Active Problem List   Diagnosis Date Noted   Rotator cuff tendinitis, right 04/04/2020   Fracture of distal end of right radius 08/21/2017   Foot drop, right 09/26/2015   Stress incontinence in female 08/23/2015   Hearing loss 08/23/2015   Lumbosacral spondylosis with radiculopathy 07/02/2015   Acute low back pain with sciatica 05/29/2015   Hypothyroidism 05/28/2015   Vitamin D deficiency 05/28/2015   Hypertension associated with diabetes ([place]) 12/08/2013   Obesity 12/08/2013   Hyperlipidemia associated with type 2 diabetes mellitus ([place]) 09/07/2013   Diabetes mellitus type 2 in obese ([place]) 09/07/2013   REFERRING PROVIDER: Janora Norlander, DO   REFERRING DIAG: Chronic right-sided low back pain with right-sided  sciatica   Rationale for Evaluation and Treatment: Rehabilitation  THERAPY DIAG:  Radiculopathy, lumbar region  ONSET DATE: September 2023  SUBJECTIVE:                                                                                                                                                                                           SUBJECTIVE STATEMENT: Denies any pain today. Okay with discharge. Has some throbbing pain in RLE when laying a certain way but goes away with change in position.  PERTINENT HISTORY:  Diabetes, hearing loss, arthritis, depression, hypertension, and  history of lumbar surgery (2017)  PAIN: Are you having pain? No.  PRECAUTIONS: None  PATIENT GOALS: reduced pain and be able to exercise more  NEXT MD VISIT: 10/17/22  OBJECTIVE:   PATIENT SURVEYS:  FOTO 17% limitation  LUMBAR ROM:   AROM eval 05/09/2022  Flexion 62   Extension 28   Right lateral flexion 50% limited; familiar pain   Left lateral flexion 50% limited; "tight"   Right rotation 50% limited; familiar pain  Felt equal per patient  Left rotation 25% limited Felt equal per patient    (Blank rows = not tested)  LOWER EXTREMITY ROM: WFL for activities assessed   LOWER EXTREMITY MMT:    MMT Right eval Left eval  Hip flexion 4/5 4/5  Hip extension    Hip abduction    Hip adduction    Hip internal rotation    Hip external rotation    Knee flexion 5/5 5/5  Knee extension 4/5 4+/5  Ankle dorsiflexion 4-/5 4-/5  Ankle plantarflexion    Ankle inversion    Ankle eversion     (Blank rows = not tested)  TODAY'S TREATMENT:                                                                                                                              DATE:                                   2/1                                    EXERCISE LOG  Exercise Repetitions and Resistance Comments  Nustep  L3 x 17 minutes   SLR BLE x20 reps each   Hip adduction isometric X20 reps 3 sec holds    Bridge  2x10  reps 5 sec holds   Marching Supine x30 reps   Supine clam Red x20 reps   LAQ 4# x 2 minutes Alternating LE   XTS Blue Shldr Ext 2x10   XTS Blue Rows 2x10     Blank cell = exercise not performed today   PATIENT EDUCATION:  Education details: POC, healing, prognosis, and goals for therapy Person educated: Patient Education method: Explanation Education comprehension: verbalized understanding  HOME EXERCISE PROGRAM:  ASSESSMENT:  CLINICAL IMPRESSION: Patient presented in clinic with reports of no LE pain today. Patient able to achieve most goals set at evaluation and feels as if her standing lumbar rotation is equal. Patient had no complaints of pain during the therex session. No concerns or questions following end of treatment and reported feeling appropriate for discharge.  PHYSICAL THERAPY DISCHARGE SUMMARY  Visits from Start of Care: 4  Current functional level related to goals / functional outcomes: Patient was able to meet most of her goals for  physical therapy.    Remaining deficits: None    Education / Equipment: HEP    Patient agrees to discharge. Patient goals were partially met. Patient is being discharged due to being pleased with the current functional level.  Jacqulynn Cadet, PT, DPT    OBJECTIVE IMPAIRMENTS: Abnormal gait, decreased activity tolerance, decreased ROM, decreased strength, impaired flexibility, and pain.   ACTIVITY LIMITATIONS: sitting and sleeping  PARTICIPATION LIMITATIONS: occupation  PERSONAL FACTORS: Profession, Time since onset of injury/illness/exacerbation, and 3+ comorbidities: Diabetes, hearing loss, arthritis, depression, hypertension, and history of lumbar surgery (2017)  are also affecting patient's functional outcome.   REHAB POTENTIAL: Good  CLINICAL DECISION MAKING: Evolving/moderate complexity  EVALUATION COMPLEXITY: Moderate  GOALS: Goals reviewed with patient? Yes  LONG TERM GOALS: Target date:  05/16/22  Patient will be independent with her HEP.  Baseline:  Goal status: Partially met  2.  Patient will be able to complete her daily activities without her familiar low back and right lower extremity pain exceeding 1/10. Baseline:  Goal status: MET  3.  Patient will report being able to sit for at least 45 minutes without being limited by her familiar symptoms for improved function with her critical job demands. Baseline:  Goal status: MET  4.  Patient will be able to demonstrate equal lumbar rotation bilaterally without reproducing her familiar pain for improved function with her daily activities. Baseline:  Goal status: MET  PLAN:  PT FREQUENCY: 1x/week; patient is only able to attend therapy once per week due to her work schedule  PT DURATION: 4 weeks  PLANNED INTERVENTIONS: Therapeutic exercises, Therapeutic activity, Neuromuscular re-education, Balance training, Gait training, Patient/Family education, Self Care, Joint mobilization, Dry Needling, Electrical stimulation, Spinal mobilization, Cryotherapy, Moist heat, Vasopneumatic device, Traction, Manual therapy, and Re-evaluation.  PLAN FOR NEXT SESSION: nustep,, bridges, SLR, lower extremity strengthening, and modalities as needed  Standley Brooking, PTA 05/09/2022, 9:36 AM

## 2022-05-15 ENCOUNTER — Encounter: Payer: Self-pay | Admitting: Family Medicine

## 2022-05-15 ENCOUNTER — Ambulatory Visit: Payer: BC Managed Care – PPO | Admitting: Family Medicine

## 2022-05-15 VITALS — BP 128/76 | HR 79 | Temp 98.7°F | Ht 64.0 in | Wt 191.0 lb

## 2022-05-15 DIAGNOSIS — Z9889 Other specified postprocedural states: Secondary | ICD-10-CM

## 2022-05-15 DIAGNOSIS — G8929 Other chronic pain: Secondary | ICD-10-CM | POA: Diagnosis not present

## 2022-05-15 DIAGNOSIS — M25512 Pain in left shoulder: Secondary | ICD-10-CM

## 2022-05-15 DIAGNOSIS — M5441 Lumbago with sciatica, right side: Secondary | ICD-10-CM

## 2022-05-15 MED ORDER — NAPROXEN 500 MG PO TABS: 500.0000 mg | ORAL_TABLET | Freq: Two times a day (BID) | ORAL | 0 refills | Status: DC

## 2022-05-15 NOTE — Patient Instructions (Signed)
Do not take Advil, Aleve, or other NSAIDs while taking Naproxen. May continue to take Baby aspirin.

## 2022-05-15 NOTE — Progress Notes (Signed)
Acute Office Visit  Subjective:   Patient ID: Lisa Stephens, female    DOB: 1955-05-08, 67 y.o.   MRN: [id number]  Chief Complaint  Patient presents with   Back Pain    Radiating to right leg Completed 4 weeks of physical therapy Left upper arm pain shoulder on down    Seen 04/04/22 By Lajuana Ripple, DO for LBP. Has completed 4 weeks of PT and remains in pain.  Pt has hx of "pinched nerve surgery" in 2017 by neurosurgeon Dr. Marland Kitchen Ditty States she has not taken robaxin since December.  Has not been taking advil or ibuprofen. Occasionally uses Aleve and Acetaminophen.  Works as a Designer, multimedia with a physically demanding job.  Presents today with new onset left shoulder pain.    Back Pain This is a chronic problem. The current episode started more than 1 month ago. Associated symptoms include leg pain and numbness. Pertinent negatives include no abdominal pain, bladder incontinence, bowel incontinence, chest pain, dysuria, fever, headaches, perianal numbness, tingling or weakness. Risk factors include obesity. She has tried muscle relaxant, analgesics, home exercises and heat (PT) for the symptoms.  Arm Pain  The incident occurred more than 1 week ago. Incident location: PT. The pain is present in the left shoulder. The quality of the pain is described as aching (sore). The pain radiates to the left hand. The pain is at a severity of 8/10. The pain is severe. The pain has been Intermittent (on raising) since the incident. Associated symptoms include numbness. Pertinent negatives include no chest pain or tingling. The symptoms are aggravated by lifting and movement. Treatments tried: voltera cream. The treatment provided no relief.      Review of Systems  Constitutional:  Negative for fever.  Cardiovascular:  Negative for chest pain.  Gastrointestinal:  Negative for abdominal pain and bowel incontinence.  Genitourinary:  Negative for bladder incontinence and dysuria.   Musculoskeletal:  Positive for back pain.  Neurological:  Positive for numbness. Negative for tingling, weakness and headaches.  All other systems reviewed and are negative.       Objective:    BP 128/76   Pulse 79   Temp 98.7 F (37.1 C)   Ht '5\' 4"'$  (1.626 m)   Wt 191 lb (86.6 kg)   SpO2 97%   BMI 32.79 kg/m    Physical Exam Constitutional:      Appearance: She is normal weight.  Cardiovascular:     Rate and Rhythm: Normal rate and regular rhythm.     Pulses: Normal pulses.  Pulmonary:     Effort: Pulmonary effort is normal. No respiratory distress.     Breath sounds: No stridor. No wheezing, rhonchi or rales.  Chest:     Chest wall: No tenderness.  Abdominal:     General: Bowel sounds are normal. There is no distension.     Tenderness: There is no abdominal tenderness. There is no guarding.  Musculoskeletal:     Left shoulder: No swelling, deformity, effusion, tenderness, bony tenderness or crepitus. Decreased range of motion (due to pain). Normal strength. Normal pulse.     Lumbar back: Spasms present. No swelling, edema, deformity, tenderness or bony tenderness. Decreased range of motion (due to pain). Positive right straight leg raise test and positive left straight leg raise test.  Neurological:     Mental Status: She is alert.       Assessment & Plan:  Kambree was seen today for back pain and shoulder pain  Diagnoses  and all orders for this visit:  Chronic right-sided low back pain with right-sided sciatica History of lumbar surgery -     naproxen (NAPROSYN) 500 MG tablet; Take 1 tablet (500 mg total) by mouth 2 (two) times daily with a meal. -     Ambulatory referral to Neurosurgery Pt has Hx of T2DM and requested to not be placed on Prednisone for conservative management. GFR per labs scanned in 01/2022 adequate for use of NSAIDs. Pt has allergy to Diclofenac, but has tolerated Aleve. Referral back to neurosurgery given hx of prior surgery. No red flag  symptoms present, discussed with patient. Follow up for new or worsening symptoms.   Acute pain of left shoulder -     naproxen (NAPROSYN) 500 MG tablet; Take 1 tablet (500 mg total) by mouth 2 (two) times daily with a meal. Discussed conservative management. Pt declined additional Physical Therapy at this time.

## 2022-05-26 DIAGNOSIS — M4727 Other spondylosis with radiculopathy, lumbosacral region: Secondary | ICD-10-CM | POA: Diagnosis not present

## 2022-05-26 DIAGNOSIS — Z6832 Body mass index (BMI) 32.0-32.9, adult: Secondary | ICD-10-CM | POA: Diagnosis not present

## 2022-05-26 DIAGNOSIS — M4317 Spondylolisthesis, lumbosacral region: Secondary | ICD-10-CM | POA: Diagnosis not present

## 2022-06-04 DIAGNOSIS — M4317 Spondylolisthesis, lumbosacral region: Secondary | ICD-10-CM | POA: Diagnosis not present

## 2022-06-04 DIAGNOSIS — M5126 Other intervertebral disc displacement, lumbar region: Secondary | ICD-10-CM | POA: Diagnosis not present

## 2022-06-12 DIAGNOSIS — M4727 Other spondylosis with radiculopathy, lumbosacral region: Secondary | ICD-10-CM | POA: Diagnosis not present

## 2022-06-12 DIAGNOSIS — Z6832 Body mass index (BMI) 32.0-32.9, adult: Secondary | ICD-10-CM | POA: Diagnosis not present

## 2022-06-12 DIAGNOSIS — M4317 Spondylolisthesis, lumbosacral region: Secondary | ICD-10-CM | POA: Diagnosis not present

## 2022-07-07 LAB — HM DIABETES EYE EXAM

## 2022-08-05 ENCOUNTER — Other Ambulatory Visit: Payer: Self-pay | Admitting: Neurosurgery

## 2022-08-11 NOTE — Pre-Procedure Instructions (Signed)
Surgical Instructions    Your procedure is scheduled on Aug 13, 2022.  Report to [facility] Main Entrance "A" at 6:30 A.M., then check in with the Admitting office.  Call this number if you have problems the morning of surgery:  [phone]  If you have any questions prior to your surgery date call [phone]: Open Monday-Friday 8am-4pm If you experience any cold or flu symptoms such as cough, fever, chills, shortness of breath, etc. between now and your scheduled surgery, please notify us at the above number.     Remember:  Do not eat after midnight the night before your surgery  You may drink clear liquids until 5:30 AM the morning of your surgery.   Clear liquids allowed are: Water, Non-Citrus Juices (without pulp), Carbonated Beverages, Clear Tea, Black Coffee Only (NO MILK, CREAM OR POWDERED CREAMER of any kind), and Gatorade.     Take these medicines the morning of surgery with A SIP OF WATER:  atorvastatin (LIPITOR)   levothyroxine (SYNTHROID)   acetaminophen (TYLENOL) - may take if needed   Follow your surgeon's instructions on when to stop Aspirin.  If no instructions were given by your surgeon then you will need to call the office to get those instructions.     As of today, STOP taking any Aleve, Naproxen, Ibuprofen, Motrin, Advil, Goody's, BC's, all herbal medications, fish oil, and all vitamins.                     WHAT DO I DO ABOUT MY DIABETES MEDICATION?   STOP taking your Semaglutide (RYBELSUS) 24 hours prior to surgery. Your last dose of this medication will be May 6th.   HOW TO MANAGE YOUR DIABETES BEFORE AND AFTER SURGERY  Why is it important to control my blood sugar before and after surgery? Improving blood sugar levels before and after surgery helps healing and can limit problems. A way of improving blood sugar control is eating a healthy diet by:  Eating less sugar and carbohydrates  Increasing activity/exercise  Talking with your doctor about  reaching your blood sugar goals High blood sugars (greater than 180 mg/dL) can raise your risk of infections and slow your recovery, so you will need to focus on controlling your diabetes during the weeks before surgery. Make sure that the doctor who takes care of your diabetes knows about your planned surgery including the date and location.  How do I manage my blood sugar before surgery? Check your blood sugar at least 4 times a day, starting 2 days before surgery, to make sure that the level is not too high or low.  Check your blood sugar the morning of your surgery when you wake up and every 2 hours until you get to the Short Stay unit.  If your blood sugar is less than 70 mg/dL, you will need to treat for low blood sugar: Do not take insulin. Treat a low blood sugar (less than 70 mg/dL) with  cup of clear juice (cranberry or apple), 4 glucose tablets, OR glucose gel. Recheck blood sugar in 15 minutes after treatment (to make sure it is greater than 70 mg/dL). If your blood sugar is not greater than 70 mg/dL on recheck, call [phone] for further instructions. Report your blood sugar to the short stay nurse when you get to Short Stay.  If you are admitted to the hospital after surgery: Your blood sugar will be checked by the staff and you will probably be given insulin  after surgery (instead of oral diabetes medicines) to make sure you have good blood sugar levels. The goal for blood sugar control after surgery is 80-180 mg/dL.  Do NOT Smoke (Tobacco/Vaping) for 24 hours prior to your procedure.  If you use a CPAP at night, you may bring your mask/headgear for your overnight stay.   Contacts, glasses, piercing's, hearing aid's, dentures or partials may not be worn into surgery, please bring cases for these belongings.    For patients admitted to the hospital, discharge time will be determined by your treatment team.   Patients discharged the day of surgery will not be allowed to  drive home, and someone needs to stay with them for 24 hours.  SURGICAL WAITING ROOM VISITATION Patients having surgery or a procedure may have no more than 2 support people in the waiting area - these visitors may rotate.   Children under the age of 61 must have an adult with them who is not the patient. If the patient needs to stay at the hospital during part of their recovery, the visitor guidelines for inpatient rooms apply. Pre-op nurse will coordinate an appropriate time for 1 support person to accompany patient in pre-op.  This support person may not rotate.   Please refer to the [facility] website for the visitor guidelines for Inpatients (after your surgery is over and you are in a regular room).    Special instructions:   [facility]- Preparing For Surgery  Before surgery, you can play an important role. Because skin is not sterile, your skin needs to be as free of germs as possible. You can reduce the number of germs on your skin by washing with CHG (chlorahexidine gluconate) Soap before surgery.  CHG is an antiseptic cleaner which kills germs and bonds with the skin to continue killing germs even after washing.    Oral Hygiene is also important to reduce your risk of infection.  Remember - BRUSH YOUR TEETH THE MORNING OF SURGERY WITH YOUR REGULAR TOOTHPASTE  Please follow the instructions on the handout you received at your pre-admission appointment about preparing for your upcoming surgery using the CHG surgical soap.    Day of Surgery: Take a shower with CHG soap. Do not wear jewelry or makeup Do not wear lotions, powders, perfumes/colognes, or deodorant. Do not shave 48 hours prior to surgery.  Men may shave face and neck. Do not bring valuables to the hospital.  [facility] is not responsible for any belongings or valuables. Do not wear nail polish, gel polish, artificial nails, or any other type of covering on natural nails (fingers and toes) If you have artificial nails  or gel coating that need to be removed by a nail salon, please have this removed prior to surgery. Artificial nails or gel coating may interfere with anesthesia's ability to adequately monitor your vital signs. Wear Clean/Comfortable clothing the morning of surgery Remember to brush your teeth WITH YOUR REGULAR TOOTHPASTE.   Please read over the following fact sheets that you were given.    If you received a COVID test during your pre-op visit  it is requested that you wear a mask when out in public, stay away from anyone that may not be feeling well and notify your surgeon if you develop symptoms. If you have been in contact with anyone that has tested positive in the last 10 days please notify you surgeon.

## 2022-08-12 ENCOUNTER — Other Ambulatory Visit: Payer: Self-pay

## 2022-08-12 ENCOUNTER — Encounter (HOSPITAL_COMMUNITY)
Admission: RE | Admit: 2022-08-12 | Discharge: 2022-08-12 | Disposition: A | Discharge status: Home / Self Care | Payer: BC Managed Care – PPO | Source: Ambulatory Visit | Attending: Neurosurgery | Admitting: Neurosurgery

## 2022-08-12 ENCOUNTER — Encounter (HOSPITAL_COMMUNITY): Payer: Self-pay

## 2022-08-12 VITALS — BP 147/80 | HR 83 | Temp 98.4°F | Resp 18 | Ht 64.0 in | Wt 189.0 lb

## 2022-08-12 DIAGNOSIS — Z01818 Encounter for other preprocedural examination: Secondary | ICD-10-CM | POA: Insufficient documentation

## 2022-08-12 DIAGNOSIS — Z794 Long term (current) use of insulin: Secondary | ICD-10-CM | POA: Insufficient documentation

## 2022-08-12 DIAGNOSIS — E119 Type 2 diabetes mellitus without complications: Secondary | ICD-10-CM | POA: Diagnosis not present

## 2022-08-12 LAB — GLUCOSE, CAPILLARY: Glucose-Capillary: 131 mg/dL — ABNORMAL HIGH (ref 70–99)

## 2022-08-12 LAB — TYPE AND SCREEN
ABO/RH(D): O POS
Antibody Screen: NEGATIVE

## 2022-08-12 LAB — BASIC METABOLIC PANEL
Anion gap: 11 (ref 5–15)
BUN: 8 mg/dL (ref 8–23)
CO2: 25 mmol/L (ref 22–32)
Calcium: 9 mg/dL (ref 8.9–10.3)
Chloride: 104 mmol/L (ref 98–111)
Creatinine, Ser: 0.69 mg/dL (ref 0.44–1.00)
GFR, Estimated: >60 mL/min (ref >60)
Glucose, Bld: 106 mg/dL — ABNORMAL HIGH (ref 70–99)
Potassium: 3.9 mmol/L (ref 3.5–5.1)
Sodium: 140 mmol/L (ref 135–145)

## 2022-08-12 LAB — SURGICAL PCR SCREEN
MRSA, PCR: NEGATIVE
Staphylococcus aureus: NEGATIVE

## 2022-08-12 LAB — CBC
HCT: 40.8 % (ref 36.0–46.0)
Hemoglobin: 13.4 g/dL (ref 12.0–15.0)
MCH: 29.5 pg (ref 26.0–34.0)
MCHC: 32.8 g/dL (ref 30.0–36.0)
MCV: 89.7 fL (ref 80.0–100.0)
Platelets: 299 10*3/uL (ref 150–400)
RBC: 4.55 MIL/uL (ref 3.87–5.11)
RDW: 13.3 % (ref 11.5–15.5)
WBC: 7.7 10*3/uL (ref 4.0–10.5)
nRBC: 0 % (ref 0.0–0.2)

## 2022-08-12 LAB — HEMOGLOBIN A1C
Hgb A1c MFr Bld: 6.2 % — ABNORMAL HIGH (ref 4.8–5.6)
Mean Plasma Glucose: 131.24 mg/dL

## 2022-08-12 NOTE — Progress Notes (Signed)
PCP - Doylene Canard, DO  Cardiologist - denies  PPM/ICD - denies Device Orders - n/a  Rep Notified - n/a  Chest x-ray - denies EKG - 08/12/2022 Stress Test - denies ECHO - denies Cardiac Cath - denies  Sleep Study - denies CPAP - n/a  DM2 Fasting Blood Sugar - 109-140; 131 today. Checks Blood Sugar three times a week  Last dose of GLP1 agonist-  yes  GLP1 instructions: hold Semaglutide (Rybelsus ) for 24 hrs before procedure. Last dose supposed to be on May 6. Per pt she took it this morning at 4 am, 08/12/2022. Still more than 24 hrs prior to procedure.   Blood Thinner Instructions: yes Aspirin Instructions: follow your surgeon's instructions on when to stop Aspirin.  If no instructions were given by your surgeon then you will need to call the office to get those instructions.     ERAS Protcol - yes; until 0915 am PRE-SURGERY Ensure or G2- no  COVID TEST- denies   Anesthesia review: yes, history of DM2 and Hypertension  Patient denies shortness of breath, fever, cough and chest pain at PAT appointment and the last 2 months.   All instructions explained to the patient, with a verbal understanding of the material. Patient agrees to go over the instructions while at home for a better understanding. Patient also instructed to self quarantine after being tested for COVID-19. The opportunity to ask questions was provided.

## 2022-08-13 ENCOUNTER — Ambulatory Visit (HOSPITAL_COMMUNITY): Payer: BC Managed Care – PPO | Admitting: Certified Registered Nurse Anesthetist

## 2022-08-13 ENCOUNTER — Observation Stay (HOSPITAL_COMMUNITY)
Admission: RE | Admit: 2022-08-13 | Discharge: 2022-08-14 | Disposition: A | Discharge status: Home / Self Care | Payer: BC Managed Care – PPO | Attending: Neurosurgery | Admitting: Neurosurgery

## 2022-08-13 ENCOUNTER — Other Ambulatory Visit: Payer: Self-pay

## 2022-08-13 ENCOUNTER — Ambulatory Visit (HOSPITAL_COMMUNITY): Payer: BC Managed Care – PPO

## 2022-08-13 ENCOUNTER — Encounter (HOSPITAL_COMMUNITY): Payer: Self-pay | Admitting: Neurosurgery

## 2022-08-13 ENCOUNTER — Encounter (HOSPITAL_COMMUNITY)
Admission: RE | Disposition: A | Discharge status: Home / Self Care | Payer: Self-pay | Source: Home / Self Care | Attending: Neurosurgery

## 2022-08-13 DIAGNOSIS — M5136 Other intervertebral disc degeneration, lumbar region: Secondary | ICD-10-CM

## 2022-08-13 DIAGNOSIS — R7309 Other abnormal glucose: Secondary | ICD-10-CM | POA: Diagnosis not present

## 2022-08-13 DIAGNOSIS — Z981 Arthrodesis status: Secondary | ICD-10-CM | POA: Diagnosis not present

## 2022-08-13 DIAGNOSIS — M48062 Spinal stenosis, lumbar region with neurogenic claudication: Secondary | ICD-10-CM | POA: Diagnosis not present

## 2022-08-13 DIAGNOSIS — Z4789 Encounter for other orthopedic aftercare: Secondary | ICD-10-CM | POA: Diagnosis not present

## 2022-08-13 DIAGNOSIS — M79651 Pain in right thigh: Secondary | ICD-10-CM

## 2022-08-13 DIAGNOSIS — M4316 Spondylolisthesis, lumbar region: Principal | ICD-10-CM | POA: Insufficient documentation

## 2022-08-13 DIAGNOSIS — M51369 Other intervertebral disc degeneration, lumbar region without mention of lumbar back pain or lower extremity pain: Secondary | ICD-10-CM

## 2022-08-13 DIAGNOSIS — E119 Type 2 diabetes mellitus without complications: Secondary | ICD-10-CM

## 2022-08-13 DIAGNOSIS — M25551 Pain in right hip: Secondary | ICD-10-CM

## 2022-08-13 LAB — CBC
HCT: 30.7 % — ABNORMAL LOW (ref 36.0–46.0)
Hemoglobin: 10.2 g/dL — ABNORMAL LOW (ref 12.0–15.0)
MCH: 29.3 pg (ref 26.0–34.0)
MCHC: 33.2 g/dL (ref 30.0–36.0)
MCV: 88.2 fL (ref 80.0–100.0)
Platelets: 231 10*3/uL (ref 150–400)
RBC: 3.48 MIL/uL — ABNORMAL LOW (ref 3.87–5.11)
RDW: 13.3 % (ref 11.5–15.5)
WBC: 11.2 10*3/uL — ABNORMAL HIGH (ref 4.0–10.5)
nRBC: 0 % (ref 0.0–0.2)

## 2022-08-13 LAB — CREATININE, SERUM
Creatinine, Ser: 0.91 mg/dL (ref 0.44–1.00)
GFR, Estimated: >60 mL/min (ref >60)

## 2022-08-13 LAB — GLUCOSE, CAPILLARY
Glucose-Capillary: 97 mg/dL (ref 70–99)
Glucose-Capillary: 86 mg/dL (ref 70–99)
Glucose-Capillary: 123 mg/dL — ABNORMAL HIGH (ref 70–99)

## 2022-08-13 SURGERY — POSTERIOR LUMBAR FUSION 1 LEVEL
Anesthesia: General | Site: Spine Lumbar

## 2022-08-13 MED ORDER — ASPIRIN 81 MG PO TBEC
81.0000 mg | DELAYED_RELEASE_TABLET | Freq: Every day | ORAL | Status: DC
  Administered 2022-08-13 – 2022-08-14 (×2): 81 mg via ORAL
  Filled 2022-08-13 (×2): qty 1

## 2022-08-13 MED ORDER — LIDOCAINE-EPINEPHRINE 0.5 %-1:200000 IJ SOLN
INTRAMUSCULAR | Status: DC | PRN
  Administered 2022-08-13: 10 mL

## 2022-08-13 MED ORDER — LACTATED RINGERS IV SOLN: INTRAVENOUS | Status: DC

## 2022-08-13 MED ORDER — ROCURONIUM BROMIDE 10 MG/ML (PF) SYRINGE
PREFILLED_SYRINGE | INTRAVENOUS | Status: AC
  Filled 2022-08-13: qty 20

## 2022-08-13 MED ORDER — PHENYLEPHRINE 80 MCG/ML (10ML) SYRINGE FOR IV PUSH (FOR BLOOD PRESSURE SUPPORT)
PREFILLED_SYRINGE | INTRAVENOUS | Status: AC
  Filled 2022-08-13: qty 40

## 2022-08-13 MED ORDER — SUCCINYLCHOLINE CHLORIDE 200 MG/10ML IV SOSY
PREFILLED_SYRINGE | INTRAVENOUS | Status: DC | PRN
  Administered 2022-08-13: 120 mg via INTRAVENOUS

## 2022-08-13 MED ORDER — ROCURONIUM BROMIDE 10 MG/ML (PF) SYRINGE
PREFILLED_SYRINGE | INTRAVENOUS | Status: AC
  Filled 2022-08-13: qty 10

## 2022-08-13 MED ORDER — PHENYLEPHRINE HCL-NACL 20-0.9 MG/250ML-% IV SOLN
INTRAVENOUS | Status: DC | PRN
  Administered 2022-08-13: 30 ug/min via INTRAVENOUS

## 2022-08-13 MED ORDER — MORPHINE SULFATE (PF) 2 MG/ML IV SOLN: 2.0000 mg | INTRAVENOUS | Status: DC | PRN

## 2022-08-13 MED ORDER — VITAMIN D3 25 MCG (1000 UNIT) PO TABS
5000.0000 [IU] | ORAL_TABLET | Freq: Every day | ORAL | Status: DC
  Administered 2022-08-14: 5000 [IU] via ORAL
  Filled 2022-08-13 (×2): qty 5

## 2022-08-13 MED ORDER — HYDROMORPHONE HCL 1 MG/ML IJ SOLN
INTRAMUSCULAR | Status: DC | PRN
  Administered 2022-08-13: .5 mg via INTRAVENOUS

## 2022-08-13 MED ORDER — LIDOCAINE 2% (20 MG/ML) 5 ML SYRINGE
INTRAMUSCULAR | Status: AC
  Filled 2022-08-13: qty 5

## 2022-08-13 MED ORDER — THROMBIN 20000 UNITS EX SOLR
CUTANEOUS | Status: AC
  Filled 2022-08-13: qty 20000

## 2022-08-13 MED ORDER — AMLODIPINE-OLMESARTAN 10-40 MG PO TABS: 1.0000 | ORAL_TABLET | Freq: Every day | ORAL | Status: DC

## 2022-08-13 MED ORDER — THROMBIN 20000 UNITS EX SOLR: CUTANEOUS | Status: DC | PRN

## 2022-08-13 MED ORDER — CHLORHEXIDINE GLUCONATE 0.12 % MT SOLN
15.0000 mL | Freq: Once | OROMUCOSAL | Status: AC
  Administered 2022-08-13: 15 mL via OROMUCOSAL
  Filled 2022-08-13: qty 15

## 2022-08-13 MED ORDER — ONDANSETRON HCL 4 MG/2ML IJ SOLN
INTRAMUSCULAR | Status: DC | PRN
  Administered 2022-08-13: 4 mg via INTRAVENOUS

## 2022-08-13 MED ORDER — ACETAMINOPHEN 500 MG PO TABS
1000.0000 mg | ORAL_TABLET | Freq: Once | ORAL | Status: AC
  Administered 2022-08-13: 1000 mg via ORAL
  Filled 2022-08-13: qty 2

## 2022-08-13 MED ORDER — HEPARIN SODIUM (PORCINE) 5000 UNIT/ML IJ SOLN
5000.0000 [IU] | Freq: Three times a day (TID) | INTRAMUSCULAR | Status: DC
  Administered 2022-08-13 – 2022-08-14 (×3): 5000 [IU] via SUBCUTANEOUS
  Filled 2022-08-13 (×3): qty 1

## 2022-08-13 MED ORDER — HYDROMORPHONE HCL 1 MG/ML IJ SOLN
INTRAMUSCULAR | Status: AC
  Filled 2022-08-13: qty 0.5

## 2022-08-13 MED ORDER — MENTHOL 3 MG MT LOZG: 1.0000 | LOZENGE | OROMUCOSAL | Status: DC | PRN

## 2022-08-13 MED ORDER — ONDANSETRON HCL 4 MG PO TABS: 4.0000 mg | ORAL_TABLET | Freq: Four times a day (QID) | ORAL | Status: DC | PRN

## 2022-08-13 MED ORDER — 0.9 % SODIUM CHLORIDE (POUR BTL) OPTIME
TOPICAL | Status: DC | PRN
  Administered 2022-08-13: 1000 mL

## 2022-08-13 MED ORDER — PHENYLEPHRINE 80 MCG/ML (10ML) SYRINGE FOR IV PUSH (FOR BLOOD PRESSURE SUPPORT)
PREFILLED_SYRINGE | INTRAVENOUS | Status: DC | PRN
  Administered 2022-08-13 (×4): 80 ug via INTRAVENOUS

## 2022-08-13 MED ORDER — DIAZEPAM 5 MG PO TABS: 5.0000 mg | ORAL_TABLET | Freq: Four times a day (QID) | ORAL | Status: DC | PRN

## 2022-08-13 MED ORDER — PROPOFOL 10 MG/ML IV BOLUS
INTRAVENOUS | Status: DC | PRN
  Administered 2022-08-13: 150 mg via INTRAVENOUS

## 2022-08-13 MED ORDER — ACETAMINOPHEN 500 MG PO TABS
1000.0000 mg | ORAL_TABLET | Freq: Four times a day (QID) | ORAL | Status: DC
  Administered 2022-08-13 – 2022-08-14 (×3): 1000 mg via ORAL
  Filled 2022-08-13 (×3): qty 2

## 2022-08-13 MED ORDER — MIDAZOLAM HCL 2 MG/2ML IJ SOLN
INTRAMUSCULAR | Status: DC | PRN
  Administered 2022-08-13: 2 mg via INTRAVENOUS

## 2022-08-13 MED ORDER — SENNA 8.6 MG PO TABS
1.0000 | ORAL_TABLET | Freq: Two times a day (BID) | ORAL | Status: DC
  Administered 2022-08-13 – 2022-08-14 (×2): 8.6 mg via ORAL
  Filled 2022-08-13: qty 1

## 2022-08-13 MED ORDER — ALBUMIN HUMAN 5 % IV SOLN: INTRAVENOUS | Status: DC | PRN

## 2022-08-13 MED ORDER — PHENOL 1.4 % MT LIQD: 1.0000 | OROMUCOSAL | Status: DC | PRN

## 2022-08-13 MED ORDER — CO Q 10 100 MG PO CAPS: 500.0000 mg | ORAL_CAPSULE | Freq: Every day | ORAL | Status: DC

## 2022-08-13 MED ORDER — DEXAMETHASONE SODIUM PHOSPHATE 10 MG/ML IJ SOLN
INTRAMUSCULAR | Status: AC
  Filled 2022-08-13: qty 1

## 2022-08-13 MED ORDER — SODIUM CHLORIDE 0.9 % IV SOLN: 250.0000 mL | INTRAVENOUS | Status: DC

## 2022-08-13 MED ORDER — AMLODIPINE BESYLATE 10 MG PO TABS
10.0000 mg | ORAL_TABLET | Freq: Every day | ORAL | Status: DC
  Administered 2022-08-14: 10 mg via ORAL
  Filled 2022-08-13: qty 1

## 2022-08-13 MED ORDER — SODIUM CHLORIDE 0.9% FLUSH: 3.0000 mL | Freq: Two times a day (BID) | INTRAVENOUS | Status: DC

## 2022-08-13 MED ORDER — EPHEDRINE 5 MG/ML INJ
INTRAVENOUS | Status: AC
  Filled 2022-08-13: qty 10

## 2022-08-13 MED ORDER — SEMAGLUTIDE 14 MG PO TABS: 14.0000 mg | ORAL_TABLET | Freq: Every day | ORAL | Status: DC

## 2022-08-13 MED ORDER — OXYCODONE HCL ER 15 MG PO T12A
15.0000 mg | EXTENDED_RELEASE_TABLET | Freq: Two times a day (BID) | ORAL | Status: DC
  Administered 2022-08-13 – 2022-08-14 (×2): 15 mg via ORAL
  Filled 2022-08-13 (×3): qty 1

## 2022-08-13 MED ORDER — MIDAZOLAM HCL 2 MG/2ML IJ SOLN
INTRAMUSCULAR | Status: AC
  Filled 2022-08-13: qty 2

## 2022-08-13 MED ORDER — SENNA 8.6 MG PO TABS
1.0000 | ORAL_TABLET | Freq: Two times a day (BID) | ORAL | Status: DC
  Filled 2022-08-13: qty 1

## 2022-08-13 MED ORDER — OXYCODONE HCL 5 MG PO TABS
10.0000 mg | ORAL_TABLET | ORAL | Status: DC | PRN
  Administered 2022-08-13 – 2022-08-14 (×2): 10 mg via ORAL
  Filled 2022-08-13 (×4): qty 2

## 2022-08-13 MED ORDER — SODIUM CHLORIDE 0.9% FLUSH: 3.0000 mL | INTRAVENOUS | Status: DC | PRN

## 2022-08-13 MED ORDER — HYDROMORPHONE HCL 1 MG/ML IJ SOLN: 0.2500 mg | INTRAMUSCULAR | Status: DC | PRN

## 2022-08-13 MED ORDER — IRBESARTAN 150 MG PO TABS
300.0000 mg | ORAL_TABLET | Freq: Every day | ORAL | Status: DC
  Administered 2022-08-14: 300 mg via ORAL
  Filled 2022-08-13: qty 2

## 2022-08-13 MED ORDER — FENTANYL CITRATE (PF) 250 MCG/5ML IJ SOLN
INTRAMUSCULAR | Status: DC | PRN
  Administered 2022-08-13: 100 ug via INTRAVENOUS
  Administered 2022-08-13 (×3): 50 ug via INTRAVENOUS

## 2022-08-13 MED ORDER — LIDOCAINE-EPINEPHRINE 0.5 %-1:200000 IJ SOLN
INTRAMUSCULAR | Status: AC
  Filled 2022-08-13: qty 50

## 2022-08-13 MED ORDER — POTASSIUM CHLORIDE IN NACL 20-0.9 MEQ/L-% IV SOLN: INTRAVENOUS | Status: DC

## 2022-08-13 MED ORDER — FENTANYL CITRATE (PF) 250 MCG/5ML IJ SOLN
INTRAMUSCULAR | Status: AC
  Filled 2022-08-13: qty 5

## 2022-08-13 MED ORDER — BUPIVACAINE HCL (PF) 0.5 % IJ SOLN
INTRAMUSCULAR | Status: AC
  Filled 2022-08-13: qty 30

## 2022-08-13 MED ORDER — DEXAMETHASONE SODIUM PHOSPHATE 10 MG/ML IJ SOLN
INTRAMUSCULAR | Status: DC | PRN
  Administered 2022-08-13: 10 mg via INTRAVENOUS

## 2022-08-13 MED ORDER — ACETAMINOPHEN 650 MG RE SUPP: 650.0000 mg | RECTAL | Status: DC | PRN

## 2022-08-13 MED ORDER — ZOLPIDEM TARTRATE 5 MG PO TABS: 5.0000 mg | ORAL_TABLET | Freq: Every evening | ORAL | Status: DC | PRN

## 2022-08-13 MED ORDER — ONDANSETRON HCL 4 MG/2ML IJ SOLN: 4.0000 mg | Freq: Four times a day (QID) | INTRAMUSCULAR | Status: DC | PRN

## 2022-08-13 MED ORDER — CHLORHEXIDINE GLUCONATE CLOTH 2 % EX PADS: 6.0000 | MEDICATED_PAD | Freq: Once | CUTANEOUS | Status: DC

## 2022-08-13 MED ORDER — BUPIVACAINE HCL (PF) 0.5 % IJ SOLN
INTRAMUSCULAR | Status: DC | PRN
  Administered 2022-08-13: 30 mL

## 2022-08-13 MED ORDER — CEFAZOLIN SODIUM-DEXTROSE 2-4 GM/100ML-% IV SOLN
2.0000 g | INTRAVENOUS | Status: AC
  Administered 2022-08-13 (×2): 2 g via INTRAVENOUS
  Filled 2022-08-13: qty 100

## 2022-08-13 MED ORDER — CEFAZOLIN SODIUM 1 G IJ SOLR
INTRAMUSCULAR | Status: AC
  Filled 2022-08-13: qty 20

## 2022-08-13 MED ORDER — LEVOTHYROXINE SODIUM 100 MCG PO TABS
100.0000 ug | ORAL_TABLET | Freq: Every day | ORAL | Status: DC
  Administered 2022-08-14: 100 ug via ORAL
  Filled 2022-08-13: qty 1

## 2022-08-13 MED ORDER — ONDANSETRON HCL 4 MG/2ML IJ SOLN
INTRAMUSCULAR | Status: AC
  Filled 2022-08-13: qty 2

## 2022-08-13 MED ORDER — PROPOFOL 1000 MG/100ML IV EMUL
INTRAVENOUS | Status: AC
  Filled 2022-08-13: qty 100

## 2022-08-13 MED ORDER — LIDOCAINE 2% (20 MG/ML) 5 ML SYRINGE
INTRAMUSCULAR | Status: DC | PRN
  Administered 2022-08-13: 100 mg via INTRAVENOUS

## 2022-08-13 MED ORDER — PROPOFOL 10 MG/ML IV BOLUS
INTRAVENOUS | Status: AC
  Filled 2022-08-13: qty 20

## 2022-08-13 MED ORDER — OXYCODONE HCL 5 MG PO TABS
5.0000 mg | ORAL_TABLET | ORAL | Status: DC | PRN
  Administered 2022-08-14 (×2): 5 mg via ORAL

## 2022-08-13 MED ORDER — ROCURONIUM BROMIDE 10 MG/ML (PF) SYRINGE
PREFILLED_SYRINGE | INTRAVENOUS | Status: DC | PRN
  Administered 2022-08-13: 20 mg via INTRAVENOUS
  Administered 2022-08-13: 60 mg via INTRAVENOUS
  Administered 2022-08-13 (×3): 20 mg via INTRAVENOUS

## 2022-08-13 MED ORDER — ORAL CARE MOUTH RINSE: 15.0000 mL | Freq: Once | OROMUCOSAL | Status: AC

## 2022-08-13 MED ORDER — ATORVASTATIN CALCIUM 10 MG PO TABS
20.0000 mg | ORAL_TABLET | Freq: Every day | ORAL | Status: DC
  Administered 2022-08-13: 20 mg via ORAL
  Filled 2022-08-13: qty 2

## 2022-08-13 MED ORDER — ACETAMINOPHEN 325 MG PO TABS: 650.0000 mg | ORAL_TABLET | ORAL | Status: DC | PRN

## 2022-08-13 SURGICAL SUPPLY — 66 items
ADH SKN CLS APL DERMABOND .7 (GAUZE/BANDAGES/DRESSINGS) ×1
APL SKNCLS STERI-STRIP NONHPOA (GAUZE/BANDAGES/DRESSINGS)
BAG COUNTER SPONGE SURGICOUNT (BAG) ×1 IMPLANT
BAG SPNG CNTER NS LX DISP (BAG) ×1
BASKET BONE COLLECTION (BASKET) ×1 IMPLANT
BENZOIN TINCTURE PRP APPL 2/3 (GAUZE/BANDAGES/DRESSINGS) IMPLANT
BIT DRILL PLIF MAS DISP 5.5MM (DRILL) IMPLANT
BLADE BONE MILL MEDIUM (MISCELLANEOUS) ×1 IMPLANT
BLADE CLIPPER SURG (BLADE) IMPLANT
BUR MATCHSTICK NEURO 3.0 LAGG (BURR) ×1 IMPLANT
BUR PRECISION FLUTE 5.0 (BURR) ×1 IMPLANT
CANISTER SUCT 3000ML PPV (MISCELLANEOUS) ×1 IMPLANT
CNTNR URN SCR LID CUP LEK RST (MISCELLANEOUS) ×1 IMPLANT
CONT SPEC 4OZ STRL OR WHT (MISCELLANEOUS) ×2
COVER BACK TABLE 60X90IN (DRAPES) ×1 IMPLANT
DERMABOND ADVANCED .7 DNX12 (GAUZE/BANDAGES/DRESSINGS) ×1 IMPLANT
DRAPE C-ARM 42X72 X-RAY (DRAPES) ×2 IMPLANT
DRAPE C-ARMOR (DRAPES) IMPLANT
DRAPE LAPAROTOMY 100X72X124 (DRAPES) ×1 IMPLANT
DRAPE SURG 17X23 STRL (DRAPES) ×1 IMPLANT
DRILL PLIF MAS DISP 5.5MM (DRILL) ×1
DRSG OPSITE POSTOP 4X6 (GAUZE/BANDAGES/DRESSINGS) IMPLANT
DURAPREP 26ML APPLICATOR (WOUND CARE) ×1 IMPLANT
ELECT REM PT RETURN 9FT ADLT (ELECTROSURGICAL) ×1
ELECTRODE REM PT RTRN 9FT ADLT (ELECTROSURGICAL) ×1 IMPLANT
GAUZE 4X4 16PLY ~~LOC~~+RFID DBL (SPONGE) IMPLANT
GAUZE SPONGE 4X4 12PLY STRL (GAUZE/BANDAGES/DRESSINGS) IMPLANT
GLOVE EXAM NITRILE XL STR (GLOVE) IMPLANT
GLOVE SURG LTX SZ6.5 (GLOVE) ×2 IMPLANT
GOWN STRL REUS W/ TWL LRG LVL3 (GOWN DISPOSABLE) ×2 IMPLANT
GOWN STRL REUS W/ TWL XL LVL3 (GOWN DISPOSABLE) IMPLANT
GOWN STRL REUS W/TWL 2XL LVL3 (GOWN DISPOSABLE) IMPLANT
GOWN STRL REUS W/TWL LRG LVL3 (GOWN DISPOSABLE) ×2
GOWN STRL REUS W/TWL XL LVL3 (GOWN DISPOSABLE)
KIT BASIN OR (CUSTOM PROCEDURE TRAY) ×1 IMPLANT
KIT POSITION SURG JACKSON T1 (MISCELLANEOUS) ×1 IMPLANT
KIT TURNOVER KIT B (KITS) ×1 IMPLANT
MILL BONE PREP (MISCELLANEOUS) IMPLANT
NDL HYPO 25X1 1.5 SAFETY (NEEDLE) ×1 IMPLANT
NDL SPNL 18GX3.5 QUINCKE PK (NEEDLE) IMPLANT
NEEDLE HYPO 25X1 1.5 SAFETY (NEEDLE) ×1 IMPLANT
NEEDLE SPNL 18GX3.5 QUINCKE PK (NEEDLE) IMPLANT
NS IRRIG 1000ML POUR BTL (IV SOLUTION) ×1 IMPLANT
PACK LAMINECTOMY NEURO (CUSTOM PROCEDURE TRAY) ×1 IMPLANT
PAD ARMBOARD 7.5X6 YLW CONV (MISCELLANEOUS) ×2 IMPLANT
ROD 35MM (Rod) IMPLANT
SCREW LOCK (Screw) ×4 IMPLANT
SCREW LOCK FXNS SPNE MAS PL (Screw) IMPLANT
SCREW SHANK 6.5X65 (Screw) IMPLANT
SCREW TULIP 5.5 (Screw) IMPLANT
SOL ELECTROSURG ANTI STICK (MISCELLANEOUS) ×1
SOLUTION ELECTROSURG ANTI STCK (MISCELLANEOUS) ×1 IMPLANT
SPACER PLIF AST 9X22X7 3D NS (Spacer) IMPLANT
SPIKE FLUID TRANSFER (MISCELLANEOUS) ×1 IMPLANT
SPONGE SURGIFOAM ABS GEL 100 (HEMOSTASIS) ×1 IMPLANT
SPONGE T-LAP 4X18 ~~LOC~~+RFID (SPONGE) IMPLANT
STRIP CLOSURE SKIN 1/2X4 (GAUZE/BANDAGES/DRESSINGS) IMPLANT
SUT PROLENE 6 0 BV (SUTURE) IMPLANT
SUT VIC AB 0 CT1 18XCR BRD8 (SUTURE) ×1 IMPLANT
SUT VIC AB 0 CT1 8-18 (SUTURE) ×2
SUT VIC AB 2-0 CT1 18 (SUTURE) ×1 IMPLANT
SUT VIC AB 3-0 SH 8-18 (SUTURE) ×1 IMPLANT
TOWEL GREEN STERILE (TOWEL DISPOSABLE) ×1 IMPLANT
TOWEL GREEN STERILE FF (TOWEL DISPOSABLE) ×1 IMPLANT
TRAY FOLEY MTR SLVR 16FR STAT (SET/KITS/TRAYS/PACK) ×1 IMPLANT
WATER STERILE IRR 1000ML POUR (IV SOLUTION) ×1 IMPLANT

## 2022-08-13 NOTE — H&P (Signed)
BP (!) 147/91   Pulse 73   Temp 98 F (36.7 C) (Oral)   Resp 18   Ht 5\' 4"  (1.626 m)   Wt 85.7 kg   SpO2 96%   BMI 32.44 kg/m   [name] comes in today for evaluation of pain that she has in the right lower extremity, essentially in her thigh.  This occurred on December 28, 2021.  She underwent a lumbar operation on 07/02/2015 done by Dr. Bevely Palmer.  She had an L3-4, 4-5 posterior lumbar interbody arthrodesis, pedicle screws from L3 to L5.  She did well after that case and had no problems until September.  She has had physical therapy.  She continues to have pain in the lower back.  She is continuing to work also, but she did leave her job early approximately 2 weeks ago.  She was given naproxen and states that has been doing a very good job of controlling her pain.     On her intake sheet, she states that she has pain in her lower back and hips.  She said bending and rasping her back up is when it started, and this was May 14, 2022, said that the pain was sudden.     MEDICATIONS :  She takes Levothyroxine, Atorvastatin, Rybelsus, Amlod/olmesa, [facility] 81 mg, Vitamin, CoQ10, Vitamin D3, Omega-3.     ALLERGIES :  She has no known drug allergies.     REVIEW OF SYSTEMS :  Positive for back pain.     PAST MEDICAL HISTORY :  Otherwise good.  She has no hereditary diseases in the family.  She has had x-rays, bone scan, steroid injections, physical therapy and pain management.  She has no weakness in the lower extremities.  She can sit for hours.  She has no bowel or bladder dysfunction.     SOCIAL HISTORY :  She is married.  She does not live alone.  She does have children. She is right-handed. She is not pregnant.  She does not use alcohol.  She does not smoke.     PHYSICAL EXAM :  She is alert, oriented x4.  She answers all questions appropriately.  Memory, language, attention span, and fund of knowledge are normal.  Speech is clear, it is also fluent.  Hearing  intact to voice.  Uvula elevates in the midline.  Shoulder shrug is normal.  Tongue protrudes in the midline.  She has 2+ reflexes, biceps, triceps, brachioradialis, knees, trace at the ankles.  Negative Romberg.  She has a normal gait.  Normal muscle tone, bulk, and coordination.   [name] returns today with an MRI of the lumbar spine.  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   She and I went over in great length what the pictures show, what stenosis is, why do I think an operation is better.  But, she and her husband are taking care of her father-in-law, who is in poor health at this moment.  Therefore, it would be difficult for her to be incapacitated, even for a short period of time, because that would put everything on the husband.

## 2022-08-13 NOTE — Anesthesia Procedure Notes (Signed)
Procedure Name: Intubation Date/Time: 08/13/2022 1:33 PM  Performed by: Cheree Ditto, CRNAPre-anesthesia Checklist: Patient identified, Emergency Drugs available, Suction available and Patient being monitored Patient Re-evaluated:Patient Re-evaluated prior to induction Oxygen Delivery Method: Circle system utilized Preoxygenation: Pre-oxygenation with 100% oxygen Induction Type: IV induction and Rapid sequence Laryngoscope Size: Mac and 3 Grade View: Grade I Tube type: Oral Tube size: 7.0 mm Number of attempts: 1 Airway Equipment and Method: Stylet Placement Confirmation: ETT inserted through vocal cords under direct vision, positive ETCO2 and breath sounds checked- equal and bilateral Secured at: 21 cm Tube secured with: Tape Dental Injury: Teeth and Oropharynx as per pre-operative assessment  Comments: Lauren Cozart, SRNA placed ETT under direct supervison. Atraumatic intubation. MDA and CRNA at bedside.

## 2022-08-13 NOTE — Anesthesia Postprocedure Evaluation (Signed)
Anesthesia Post Note  Patient: Encarnacion Burri  Procedure(s) Performed: Lumbar Two-Lumbar Three Posterior Lumbar Interbody Fusion (Spine Lumbar)     Patient location during evaluation: PACU Anesthesia Type: General Level of consciousness: awake and alert, patient cooperative and oriented Pain management: pain level controlled Vital Signs Assessment: post-procedure vital signs reviewed and stable Respiratory status: spontaneous breathing, nonlabored ventilation, respiratory function stable and patient connected to nasal cannula oxygen Cardiovascular status: blood pressure returned to baseline and stable Postop Assessment: no apparent nausea or vomiting Anesthetic complications: no   No notable events documented.  Last Vitals:  Vitals:   08/13/22 1845 08/13/22 1900  BP: 106/63 110/60  Pulse: 87 83  Resp: 11 12  Temp:    SpO2: 95% 97%    Last Pain:  Vitals:   08/13/22 1900  TempSrc:   PainSc: 0-No pain                 Sireen Halk,E. Starla Deller

## 2022-08-13 NOTE — Transfer of Care (Signed)
Immediate Anesthesia Transfer of Care Note  Patient: Lisa Stephens  Procedure(s) Performed: Lumbar Two-Lumbar Three Posterior Lumbar Interbody Fusion (Spine Lumbar)  Patient Location: PACU  Anesthesia Type:General  Level of Consciousness: awake, oriented, and drowsy  Airway & Oxygen Therapy: Patient Spontanous Breathing and Patient connected to nasal cannula oxygen  Post-op Assessment: Report given to RN, Post -op Vital signs reviewed and stable, and Patient moving all extremities X 4  Post vital signs: Reviewed and stable  Last Vitals:  Vitals Value Taken Time  BP 112/63 08/13/22 1837  Temp    Pulse 91 08/13/22 1839  Resp 11 08/13/22 1839  SpO2 97 % 08/13/22 1839  Vitals shown include unvalidated device data.  Last Pain:  Vitals:   08/13/22 1047  TempSrc:   PainSc: 0-No pain         Complications: No notable events documented.

## 2022-08-13 NOTE — Anesthesia Preprocedure Evaluation (Addendum)
Anesthesia Evaluation  Patient identified by MRN, date of birth, ID band Patient awake    Reviewed: Allergy & Precautions, H&P , NPO status , Patient's Chart, lab work & pertinent test results  Airway Mallampati: II  TM Distance: >3 FB Neck ROM: Full    Dental no notable dental hx. (+) Teeth Intact, Dental Advisory Given   Pulmonary neg pulmonary ROS   Pulmonary exam normal breath sounds clear to auscultation       Cardiovascular hypertension, Pt. on medications  Rhythm:Regular Rate:Normal     Neuro/Psych    Depression    negative neurological ROS     GI/Hepatic negative GI ROS, Neg liver ROS,,,  Endo/Other  diabetes, Type 2, Oral Hypoglycemic AgentsHypothyroidism    Renal/GU negative Renal ROS  negative genitourinary   Musculoskeletal  (+) Arthritis , Osteoarthritis,    Abdominal   Peds  Hematology negative hematology ROS (+)   Anesthesia Other Findings   Reproductive/Obstetrics negative OB ROS                             Anesthesia Physical Anesthesia Plan  ASA: 2  Anesthesia Plan: General   Post-op Pain Management: Tylenol PO (pre-op)*   Induction: Intravenous  PONV Risk Score and Plan: 4 or greater and Ondansetron, Dexamethasone and Midazolam  Airway Management Planned: Oral ETT  Additional Equipment:   Intra-op Plan:   Post-operative Plan: Extubation in OR  Informed Consent: I have reviewed the patients History and Physical, chart, labs and discussed the procedure including the risks, benefits and alternatives for the proposed anesthesia with the patient or authorized representative who has indicated his/her understanding and acceptance.     Dental advisory given  Plan Discussed with: CRNA  Anesthesia Plan Comments:        Anesthesia Quick Evaluation

## 2022-08-14 DIAGNOSIS — R7309 Other abnormal glucose: Secondary | ICD-10-CM | POA: Diagnosis not present

## 2022-08-14 DIAGNOSIS — M48062 Spinal stenosis, lumbar region with neurogenic claudication: Secondary | ICD-10-CM | POA: Diagnosis not present

## 2022-08-14 DIAGNOSIS — M4316 Spondylolisthesis, lumbar region: Secondary | ICD-10-CM | POA: Diagnosis not present

## 2022-08-14 LAB — GLUCOSE, CAPILLARY
Glucose-Capillary: 203 mg/dL — ABNORMAL HIGH (ref 70–99)
Glucose-Capillary: 143 mg/dL — ABNORMAL HIGH (ref 70–99)

## 2022-08-14 MED ORDER — OXYCODONE HCL 10 MG PO TABS: 10.0000 mg | ORAL_TABLET | Freq: Four times a day (QID) | ORAL | 0 refills | Status: AC | PRN

## 2022-08-14 MED ORDER — METHOCARBAMOL 500 MG PO TABS
500.0000 mg | ORAL_TABLET | Freq: Three times a day (TID) | ORAL | 1 refills | Status: AC | PRN
Start: 2022-08-14 — End: ?

## 2022-08-14 NOTE — Discharge Instructions (Signed)

## 2022-08-14 NOTE — Evaluation (Signed)
Occupational Therapy Evaluation Patient Details Name: Lisa Stephens MRN: [id number] DOB: 02-13-1956 Today's Date: 08/14/2022   History of Present Illness Pt is a 67 y/o female who presents s/p L2-L3 PLIF on 08/13/22. PMH significant for DM, HTN, hypothyroidism, back surgery x2 in 2017.   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 Upon eval, pt performing UB Adl with set-up and LB ADL with up to min guard A. Pt educated and demonstrating use of compensatory techniques for LB Adl, bed mobility, tub-shower transfers, grooming, and toileting within precautions. Pt husband present and reporting he can assist when returning home as needed. All education provided and questions answered. OT to sign off. Please re-consult if change in status. Thank you for this order.      Recommendations for follow up therapy are one component of a multi-disciplinary discharge planning process, led by the attending physician.  Recommendations may be updated based on patient status, additional functional criteria and insurance authorization.   Assistance Recommended at Discharge Intermittent Supervision/Assistance  Patient can return home with the following A little help with walking and/or transfers;A little help with bathing/dressing/bathroom;Assist for transportation;Help with stairs or ramp for entrance;Assistance with cooking/housework    Functional Status Assessment  Patient has had a recent decline in their functional status and demonstrates the ability to make significant improvements in function in a reasonable and predictable amount of time.  Equipment Recommendations  None recommended by OT    Recommendations for Other Services       Precautions / Restrictions Precautions Precautions: Fall;Back Precaution Booklet Issued: Yes (comment) Precaution Comments: All precautions reviewed within the context of ADL Required Braces or Orthoses:  (No brace needed  order) Restrictions Weight Bearing Restrictions: No      Mobility Bed Mobility Overal bed mobility: Needs Assistance Bed Mobility: Rolling, Sidelying to Sit, Sit to Sidelying Rolling: Min assist Sidelying to sit: Min guard     Sit to sidelying: Min guard General bed mobility comments: HOB flat and rails lowered to simulate home environment. Assist for full roll but pt able to complete trunk elevation to full sitting position and return to sidelying without assist.    Transfers Overall transfer level: Needs assistance Equipment used: Rolling walker (2 wheels) Transfers: Sit to/from Stand Sit to Stand: Min guard           General transfer comment: Close guard for safety as pt powered up to full stand. Multimodal cues for hand placement on seated surface for safety.      Balance Overall balance assessment: Needs assistance Sitting-balance support: Feet supported, No upper extremity supported Sitting balance-Leahy Scale: Fair     Standing balance support: During functional activity, No upper extremity supported Standing balance-Leahy Scale: Fair                             ADL either performed or assessed with clinical judgement   ADL Overall ADL's : Needs assistance/impaired Eating/Feeding: Independent   Grooming: Standing;Min guard   Upper Body Bathing: Set up;Sitting   Lower Body Bathing: Min guard;Sit to/from stand   Upper Body Dressing : Set up;Sitting   Lower Body Dressing: Min guard;Sit to/from stand Lower Body Dressing Details (indicate cue type and reason): incr time and effort to achieve figure 4 ` Toilet Transfer: Rolling walker (2 wheels);Ambulation;Supervision/safety   Toileting- Clothing Manipulation and Hygiene: Set up;Sitting/lateral lean   Tub/ Shower Transfer: Tub transfer;Min guard;Ambulation;Rolling walker (2  wheels) Tub/Shower Transfer Details (indicate cue type and reason): educated and pt demonstrating within  precautions Functional mobility during ADLs: Supervision/safety;Rolling walker (2 wheels)       Vision Baseline Vision/History: 1 Wears glasses Ability to See in Adequate Light: 0 Adequate Patient Visual Report: No change from baseline Vision Assessment?: No apparent visual deficits     Perception Perception Perception Tested?: No   Praxis Praxis Praxis tested?: Within functional limits    Pertinent Vitals/Pain Pain Assessment Pain Assessment: Faces Faces Pain Scale: Hurts a little bit Pain Location: Incision site Pain Descriptors / Indicators: Operative site guarding Pain Intervention(s): Limited activity within patient's tolerance, Monitored during session     Hand Dominance     Extremity/Trunk Assessment Upper Extremity Assessment Upper Extremity Assessment: Overall WFL for tasks assessed   Lower Extremity Assessment Lower Extremity Assessment: Generalized weakness   Cervical / Trunk Assessment Cervical / Trunk Assessment: Back Surgery   Communication Communication Communication: No difficulties   Cognition Arousal/Alertness: Awake/alert Behavior During Therapy: WFL for tasks assessed/performed Overall Cognitive Status: Within Functional Limits for tasks assessed                                       General Comments  VSS    Exercises     Shoulder Instructions      Home Living Family/patient expects to be discharged to:: Private residence Living Arrangements: Spouse/significant other Available Help at Discharge: Family;Available 24 hours/day (first couple weeks) Type of Home: House Home Access: Stairs to enter [employer] of Steps: 3 Entrance Stairs-Rails: None Home Layout: Two level Alternate Level Stairs-Number of Steps: 4 Alternate Level Stairs-Rails: Right;Left;Can reach both Bathroom Shower/Tub: Chief Strategy Officer: Standard     Home Equipment: Agricultural consultant (2 wheels);BSC/3in1          Prior  Functioning/Environment Prior Level of Function : Independent/Modified Independent             Mobility Comments: Not utilizing an AD          OT Problem List: Decreased strength;Decreased activity tolerance;Impaired balance (sitting and/or standing);Decreased safety awareness;Decreased knowledge of use of DME or AE;Decreased knowledge of precautions      OT Treatment/Interventions:      OT Goals(Current goals can be found in the care plan section) Acute Rehab OT Goals Patient Stated Goal: go home OT Goal Formulation: With patient  OT Frequency:      Co-evaluation              AM-PAC OT "6 Clicks" Daily Activity     Outcome Measure Help from another person eating meals?: None Help from another person taking care of personal grooming?: A Little Help from another person toileting, which includes using toliet, bedpan, or urinal?: A Little Help from another person bathing (including washing, rinsing, drying)?: A Little Help from another person to put on and taking off regular upper body clothing?: A Little Help from another person to put on and taking off regular lower body clothing?: A Little 6 Click Score: 19   End of Session Equipment Utilized During Treatment: Rolling walker (2 wheels) Nurse Communication: Mobility status  Activity Tolerance: Patient tolerated treatment well Patient left: in bed;with call bell/phone within reach;with family/visitor present  OT Visit Diagnosis: Unsteadiness on feet (R26.81);Muscle weakness (generalized) (M62.81)                Time: 1610-9604  OT Time Calculation (min): 30 min Charges:  OT General Charges $OT Visit: 1 Visit OT Evaluation $OT Eval Low Complexity: 1 Low OT Treatments $Self Care/Home Management : 8-22 mins  Tyler Deis, OTR/L [facility] Acute Rehabilitation Office: [phone]   Myrla Halsted 08/14/2022, 10:17 AM

## 2022-08-14 NOTE — Evaluation (Signed)
Physical Therapy Evaluation  Patient Details Name: Lisa Stephens MRN: [id number] DOB: Sep 08, 1955 Today's Date: 08/14/2022  History of Present Illness  Pt is a 67 y/o female who presents s/p L2-L3 PLIF on 08/13/22. PMH significant for DM, HTN, hypothyroidism, back surgery x2 in 2017.   Clinical Impression  Pt admitted with above diagnosis. At the time of PT eval, pt was able to demonstrate transfers and ambulation with gross min guard assist to supervision for safety and RW for support. Pt was educated on precautions, positioning recommendations, appropriate activity progression, and car transfer. Pt currently with functional limitations due to the deficits listed below (see PT Problem List). Pt will benefit from skilled PT to increase their independence and safety with mobility to allow discharge to the venue listed below.         Recommendations for follow up therapy are one component of a multi-disciplinary discharge planning process, led by the attending physician.  Recommendations may be updated based on patient status, additional functional criteria and insurance authorization.  Follow Up Recommendations       Assistance Recommended at Discharge PRN  Patient can return home with the following  A little help with walking and/or transfers;A little help with bathing/dressing/bathroom;Assistance with cooking/housework;Assist for transportation;Help with stairs or ramp for entrance    Equipment Recommendations None recommended by PT  Recommendations for Other Services       Functional Status Assessment Patient has had a recent decline in their functional status and demonstrates the ability to make significant improvements in function in a reasonable and predictable amount of time.     Precautions / Restrictions Precautions Precautions: Fall;Back Precaution Booklet Issued: Yes (comment) Precaution Comments: Reviewed handout and pt was cued for precautions during functional  mobility. Required Braces or Orthoses:  (No brace needed order) Restrictions Weight Bearing Restrictions: No      Mobility  Bed Mobility Overal bed mobility: Needs Assistance Bed Mobility: Rolling, Sidelying to Sit, Sit to Sidelying Rolling: Min assist Sidelying to sit: Min guard     Sit to sidelying: Min guard General bed mobility comments: HOB flat and rails lowered to simulate home environment. Assist for full roll but pt able to complete trunk elevation to full sitting position and return to sidelying without assist.    Transfers Overall transfer level: Needs assistance Equipment used: Rolling walker (2 wheels) Transfers: Sit to/from Stand Sit to Stand: Min guard           General transfer comment: Close guard for safety as pt powered up to full stand. Multimodal cues for hand placement on seated surface for safety.    Ambulation/Gait Ambulation/Gait assistance: Min guard Gait Distance (Feet): 500 Feet Assistive device: Rolling walker (2 wheels) Gait Pattern/deviations: Step-through pattern, Decreased stride length, Trunk flexed Gait velocity: Decreased Gait velocity interpretation: 1.31 - 2.62 ft/sec, indicative of limited community ambulator   General Gait Details: VC's for improved posture. Pt with good walker proximity and forward gaze. No unsteadiness or LOB noted.  Stairs Stairs: Yes Stairs assistance: Min assist Stair Management: One rail Left, Step to pattern, Forwards (HHA on the R) Number of Stairs: 4 General stair comments: VC's for sequencing and general safety. Heavy min assist through RUE for support as pt advanced to next step.  Wheelchair Mobility    Modified Rankin (Stroke Patients Only)       Balance Overall balance assessment: Needs assistance Sitting-balance support: Feet supported, No upper extremity supported Sitting balance-Leahy Scale: Fair     Standing balance support:  During functional activity, No upper extremity  supported Standing balance-Leahy Scale: Fair                               Pertinent Vitals/Pain Pain Assessment Pain Assessment: 0-10 Pain Score: 1  Pain Location: Incision site Pain Descriptors / Indicators: Operative site guarding Pain Intervention(s): Limited activity within patient's tolerance, Monitored during session, Repositioned    Home Living Family/patient expects to be discharged to:: Private residence Living Arrangements: Spouse/significant other Available Help at Discharge: Family;Available 24 hours/day (first couple weeks) Type of Home: House Home Access: Stairs to enter Entrance Stairs-Rails: None Entrance Stairs-Number of Steps: 3 Alternate Level Stairs-Number of Steps: 4 Home Layout: Two level Home Equipment: Agricultural consultant (2 wheels);BSC/3in1      Prior Function Prior Level of Function : Independent/Modified Independent             Mobility Comments: Not utilizing an AD       Hand Dominance        Extremity/Trunk Assessment   Upper Extremity Assessment Upper Extremity Assessment: Defer to OT evaluation    Lower Extremity Assessment Lower Extremity Assessment: Generalized weakness    Cervical / Trunk Assessment Cervical / Trunk Assessment: Back Surgery  Communication   Communication: No difficulties  Cognition Arousal/Alertness: Awake/alert Behavior During Therapy: WFL for tasks assessed/performed Overall Cognitive Status: Within Functional Limits for tasks assessed                                          General Comments      Exercises     Assessment/Plan    PT Assessment Patient needs continued PT services  PT Problem List Decreased strength;Decreased activity tolerance;Decreased balance;Decreased mobility;Decreased knowledge of use of DME;Decreased safety awareness;Decreased knowledge of precautions;Pain       PT Treatment Interventions Gait training;DME instruction;Stair training;Functional  mobility training;Patient/family education    PT Goals (Current goals can be found in the Care Plan section)  Acute Rehab PT Goals Patient Stated Goal: Home today PT Goal Formulation: With patient Time For Goal Achievement: 08/21/22 Potential to Achieve Goals: Good    Frequency Min 5X/week     Co-evaluation               AM-PAC PT "6 Clicks" Mobility  Outcome Measure Help needed turning from your back to your side while in a flat bed without using bedrails?: A Little Help needed moving from lying on your back to sitting on the side of a flat bed without using bedrails?: A Little Help needed moving to and from a bed to a chair (including a wheelchair)?: A Little Help needed standing up from a chair using your arms (e.g., wheelchair or bedside chair)?: A Little Help needed to walk in hospital room?: A Little Help needed climbing 3-5 steps with a railing? : A Little 6 Click Score: 18    End of Session Equipment Utilized During Treatment: Gait belt Activity Tolerance: Patient tolerated treatment well Patient left: in bed;with call bell/phone within reach;with family/visitor present Nurse Communication: Mobility status PT Visit Diagnosis: Unsteadiness on feet (R26.81);Pain Pain - part of body:  (back)    Time: 5409-8119 PT Time Calculation (min) (ACUTE ONLY): 17 min   Charges:   PT Evaluation $PT Eval Low Complexity: 1 Low  Conni Slipper, PT, DPT Acute Rehabilitation Services Secure Chat Preferred Office: [phone]   Marylynn Pearson 08/14/2022, 10:10 AM

## 2022-08-14 NOTE — Discharge Summary (Signed)
Physician Discharge Summary  Patient ID: Lisa Stephens MRN: [id number] DOB/AGE: [birth year] 67 y.o.  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 Post op she is ambulating, voiding, and tolerating a regular diet. Her wound is clean dry and without signs of infection. She has been cleared by PT, OT for discharge.  Treatments: surgery: Posterior lumbar interbody arthrodesis L2/3 with HalfDome X cages Pedicle screw fixation L2/3 nuvasive screws Removal L4, L5 screws  Discharge Exam: Blood pressure 130/62, pulse 91, temperature 98.5 F (36.9 C), temperature source Oral, resp. rate 16, height 5\' 4"  (1.626 m), weight 85.7 kg, SpO2 96 %. General appearance: alert, cooperative, appears stated age, and mild distress  Disposition: Discharge disposition: 01-Home or Self Care      Spondylolisthesis, Lumbar region  Allergies as of 08/14/2022       Reactions   Voltaren [diclofenac Sodium] Shortness Of Breath, Other (See Comments)   Wheezing, coughing, runny nose        Medication List     TAKE these medications    acetaminophen 500 MG tablet Commonly known as: TYLENOL Take 1 tablet (500 mg total) by mouth every 6 (six) hours as needed.   amLODipine-olmesartan 10-40 MG tablet Commonly known as: AZOR Take 1 tablet by mouth daily.   aspirin EC 81 MG tablet Take 81 mg by mouth daily.   atorvastatin 20 MG tablet Commonly known as: LIPITOR Take 1 tablet 3 days per week for cholesterol.   CO Q 10 PO Take 500 mg by mouth daily.    Contour Next Monitor w/Device Kit   Contour Next Test test strip Generic drug: glucose blood as directed per package instructions In Vitro twice a day for 90 days   Contour Next Test test strip Generic drug: glucose blood   famotidine 20 MG tablet Commonly known as: Pepcid Take 1 tablet (20 mg total) by mouth 2 (two) times daily for 14 days. For reflux   Fish Oil 1000 MG Caps Take 1,000 mg by mouth daily.   hydrOXYzine 25 MG capsule Commonly known as: VISTARIL Take 1 capsule (25 mg total) by mouth every 8 (eight) hours as needed for itching.   levothyroxine 100 MCG tablet Commonly known as: SYNTHROID TAKE 1 TABLET BY MOUTH  DAILY BEFORE BREAKFAST   methocarbamol 500 MG tablet Commonly known as: ROBAXIN Take 1 tablet (500 mg total) by mouth every 8 (eight) hours as needed for muscle spasms. What changed:  when to take this reasons to take this   multivitamin with minerals tablet Take 1 tablet by mouth daily.   naproxen 500 MG tablet Commonly known as: Naprosyn Take 1 tablet (500 mg total) by mouth 2 (two) times daily with a meal.   onetouch ultrasoft lancets 1 Device by Misc.(Non-Drug; Combo Route) route daily.   Oxycodone HCl 10 MG Tabs Take 1 tablet (10 mg total) by mouth every 6 (six) hours as needed for up to 8 days for severe pain ((score 7 to 10)).   Rybelsus 14 MG Tabs Generic drug: Semaglutide Take 1 tablet (14 mg total) by mouth daily.   Vitamin D-3 125 MCG (5000 UT)  Tabs Take 5,000 Units by mouth daily.        Follow-up Information     Coletta Memos, MD Follow up.   Specialty: Neurosurgery Why: keep your scheduled appointment Contact information: [address] [place] [address] [phone]                 Signed: Coletta Memos 08/14/2022, 4:02 PM

## 2022-08-14 NOTE — Op Note (Signed)
08/13/2022  5:57 PM  PATIENT:  Lisa Stephens  67 y.o. female With adjacent segment disease, lumbar stenosis, and lumbar spondylolisthesis at L2/3. Her activity is curtailed by the pain she feels when standing and or walking. 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  BLOOD ADMINISTERED:none  CELL SAVER GIVEN:mpme  COUNT:per nursing  DRAINS: none   SPECIMEN:  No Specimen  DICTATION: Lisa Stephens is a 67 y.o. female whom was taken to the operating room intubated, and placed under a general anesthetic without difficulty. A foley catheter was placed under sterile conditions. She was positioned prone on a Jackson table with all pressure points properly padded.  Her lumbar region was prepped and draped in a sterile manner. I infiltrated 10cc's 1/2%lidocaine/1:2000,000 strength epinephrine into the planned incision. I opened the skin with a 10 blade and took the incision down to the thoracolumbar fascia. I exposed the lamina of L2, and the remaining bone at L3 in a subperiosteal fashion bilaterally. I exposed the hardware at L3,4, and 5 thus confirming my location along with  intraoperative fluoroscopy.  I placed self retaining retractors and started the decompression.  I decompressed the spinal canal via semihemilaminectomies of L2 bilaterally. I used the drill and Kerrison punches to remove the bone  allowing for lateral recess decompression and foraminal compression. I exposed far more than necessary in order to complete the plif.  PLIF's were performed at L2/3 in the same fashion. I opened the disc space with a 15 blade then used a variety of instruments to remove the disc and prepare the space for the arthrodesis. I used curettes, rongeurs, punches, shavers for the disc space, and rasps in the discetomy. I measured the disc space and placed expandable cages 7-10.5 mm in height, packed with autograft morsels.(Half Dome X) into the disc space(s).  I decorticated the lateral bone at L2 and L3 on the right side. I then placed autograft morsels on the decorticated surfaces to complete the posterolateral arthrodesis.  I removed the rod, then the L4 and L5 screws. Lisa Stephens had a solid arthrodesis from her previous procedure.  I placed pedicle screws at L2, using fluoroscopic guidance. I drilled a pilot hole, then cannulated the pedicle with a drill at each site. I tapped each pedicle, assessing each site for pedicle violations. No cutouts were appreciated. Screws 6.5 x 35mm (Nuvasive) were then placed at each site without difficulty. I attached rods and locking caps with the appropriate tools. The locking caps were secured with torque limited screwdrivers. Final films were performed and the final construct appeared to be in good position.  I closed the wound in a layered fashion. I approximated the thoracolumbar fascia, subcutaneous, and subcuticular planes with vicryl sutures. I used dermabond and an occlusive bandage for a sterile dressing.     PLAN OF CARE: Admit to inpatient   PATIENT DISPOSITION:  PACU - hemodynamically stable.   Delay start of Pharmacological VTE agent (>24hrs) due to surgical blood loss or risk of bleeding:  yes

## 2022-08-14 NOTE — Plan of Care (Signed)
Pt doing well. Pt and husband given D/C instructions with verbal understanding. Rx's were sent to the pharmacy by MD. Pt's incision is clean and dry with no sign of infection. Pt's IV was removed prior to D/C. Pt D/C'd home via wheelchair per MD order. Pt is stable @ D/C and has no other needs at this time. Daisey Caloca, RN  

## 2022-08-15 LAB — GLUCOSE, CAPILLARY
Glucose-Capillary: 136 mg/dL — ABNORMAL HIGH (ref 70–99)
Glucose-Capillary: 185 mg/dL — ABNORMAL HIGH (ref 70–99)

## 2022-09-04 DIAGNOSIS — M4316 Spondylolisthesis, lumbar region: Secondary | ICD-10-CM | POA: Diagnosis not present

## 2022-10-17 ENCOUNTER — Encounter: Payer: Self-pay | Admitting: Family Medicine

## 2022-10-17 ENCOUNTER — Ambulatory Visit: Payer: BC Managed Care – PPO | Admitting: Family Medicine

## 2022-10-17 VITALS — BP 114/57 | HR 76 | Temp 97.3°F | Ht 64.0 in | Wt 183.6 lb

## 2022-10-17 DIAGNOSIS — E785 Hyperlipidemia, unspecified: Secondary | ICD-10-CM

## 2022-10-17 DIAGNOSIS — Z23 Encounter for immunization: Secondary | ICD-10-CM

## 2022-10-17 DIAGNOSIS — E669 Obesity, unspecified: Secondary | ICD-10-CM | POA: Diagnosis not present

## 2022-10-17 DIAGNOSIS — I152 Hypertension secondary to endocrine disorders: Secondary | ICD-10-CM | POA: Diagnosis not present

## 2022-10-17 DIAGNOSIS — Z7984 Long term (current) use of oral hypoglycemic drugs: Secondary | ICD-10-CM

## 2022-10-17 DIAGNOSIS — Z9889 Other specified postprocedural states: Secondary | ICD-10-CM | POA: Diagnosis not present

## 2022-10-17 DIAGNOSIS — E1169 Type 2 diabetes mellitus with other specified complication: Secondary | ICD-10-CM | POA: Diagnosis not present

## 2022-10-17 DIAGNOSIS — E1159 Type 2 diabetes mellitus with other circulatory complications: Secondary | ICD-10-CM

## 2022-10-17 DIAGNOSIS — E039 Hypothyroidism, unspecified: Secondary | ICD-10-CM

## 2022-10-17 LAB — BAYER DCA HB A1C WAIVED: HB A1C (BAYER DCA - WAIVED): 5.9 % — ABNORMAL HIGH (ref 4.8–5.6)

## 2022-10-17 NOTE — Progress Notes (Signed)
Subjective: CC:DM PCP: Raliegh Ip, DO ION:GEXBM Lisa Stephens is a 67 y.o. female presenting to clinic today for:  1. Type 2 Diabetes with hypertension, hyperlipidemia:  Since our last visit, she has undergone back surgery.  She is healing well from this and plans to return to work very soon.  She reports compliance with medications.  She walks 4 miles per day.  She feels good.  Needs new meter.  Last eye exam: 07/2022 at [facility] [address] Last foot exam: needs Last A1c:  Lab Results  Component Value Date   HGBA1C 6.2 (H) 08/12/2022   Nephropathy screen indicated?: UTD Last flu, zoster and/or pneumovax:  Immunization History  Administered Date(s) Administered   Fluad Quad(high Dose 65+) 01/20/2022   Influenza Inj Mdck Quad Pf 01/16/2021   Influenza Whole 01/23/2010, 01/27/2012, 01/25/2013, 02/02/2014, 01/18/2015   Influenza,inj,Quad PF,6+ Mos 12/26/2016   Influenza-Unspecified 01/23/2010, 01/27/2012, 01/25/2013, 02/02/2014, 01/06/2015, 01/18/2015, 01/15/2016, 12/29/2017, 01/19/2019, 01/16/2020   PFIZER(Purple Top)SARS-COV-2 Vaccination 06/21/2019, 07/12/2019, 01/17/2020   PNEUMOCOCCAL CONJUGATE-20 10/17/2022   Pneumococcal Polysaccharide-23 04/07/2013, 10/16/2014   Tdap 01/25/2013   Zoster Recombinant(Shingrix) 10/17/2022   Zoster, Live 01/15/2016    ROS: Denies dizziness, LOC, polyuria, polydipsia, unintended weight loss/gain, foot ulcerations, numbness or tingling in extremities, shortness of breath or chest pain.  2.  Hypothyroidism Compliant with Synthroid.  No reports of tremor, heart palpitations or unplanned weight loss.  ROS: Per HPI  Allergies  Allergen Reactions   Voltaren [Diclofenac Sodium] Shortness Of Breath and Other (See Comments)    Wheezing, coughing, runny nose   Past Medical History:  Diagnosis Date   Arthritis    Blood transfusion without reported diagnosis    Cataract    small beginning   Depression    Diabetes mellitus without complication (HCC)     Hyperlipidemia    Hypertension    Hypothyroidism     Current Outpatient Medications:    acetaminophen (TYLENOL) 500 MG tablet, Take 1 tablet (500 mg total) by mouth every 6 (six) hours as needed., Disp: 30 tablet, Rfl: 0   amLODipine-olmesartan (AZOR) 10-40 MG tablet, Take 1 tablet by mouth daily., Disp: 90 tablet, Rfl: 3   aspirin EC 81 MG tablet, Take 81 mg by mouth daily., Disp: , Rfl:    atorvastatin (LIPITOR) 20 MG tablet, Take 1 tablet 3 days per week for cholesterol., Disp: 36 tablet, Rfl: 3   Blood Glucose Monitoring Suppl (CONTOUR NEXT MONITOR) w/Device KIT, , Disp: , Rfl:    Cholecalciferol (VITAMIN D-3) 125 MCG (5000 UT) TABS, Take 5,000 Units by mouth daily., Disp: , Rfl:    Coenzyme Q10 (CO Q 10 PO), Take 500 mg by mouth daily., Disp: , Rfl:    CONTOUR NEXT TEST test strip, , Disp: , Rfl:    glucose blood (CONTOUR NEXT TEST) test strip, as directed per package instructions In Vitro twice a day for 90 days, Disp: , Rfl:    hydrOXYzine (VISTARIL) 25 MG capsule, Take 1 capsule (25 mg total) by mouth every 8 (eight) hours as needed for itching., Disp: 30 capsule, Rfl: 0   Lancets (ONETOUCH ULTRASOFT) lancets, 1 Device by Misc.(Non-Drug; Combo Route) route daily., Disp: , Rfl:    levothyroxine (SYNTHROID) 100 MCG tablet, TAKE 1 TABLET BY MOUTH  DAILY BEFORE BREAKFAST, Disp: 90 tablet, Rfl: 3   methocarbamol (ROBAXIN) 500 MG tablet, Take 1 tablet (500 mg total) by mouth every 8 (eight) hours as needed for muscle spasms., Disp: 60 tablet, Rfl: 1   Multiple Vitamins-Minerals (  MULTIVITAMIN WITH MINERALS) tablet, Take 1 tablet by mouth daily., Disp: , Rfl:    naproxen (NAPROSYN) 500 MG tablet, Take 1 tablet (500 mg total) by mouth 2 (two) times daily with a meal., Disp: 30 tablet, Rfl: 0   Omega-3 Fatty Acids (FISH OIL) 1000 MG CAPS, Take 1,000 mg by mouth daily., Disp: , Rfl:    Semaglutide (RYBELSUS) 14 MG TABS, Take 1 tablet (14 mg total) by mouth daily., Disp: 90 tablet, Rfl: 4    famotidine (PEPCID) 20 MG tablet, Take 1 tablet (20 mg total) by mouth 2 (two) times daily for 14 days. 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)    Social Determinants of Health   Financial Resource Strain: Low Risk  (12/06/2021)   Overall Financial Resource Strain (CARDIA)    Difficulty of Paying Living Expenses: Not hard at all  Food Insecurity: No Food Insecurity (12/06/2021)   Hunger Vital Sign    Worried About Running Out of Food in the Last Year: Never true    Ran Out of Food in the Last Year: Never true  Transportation Needs: No Transportation Needs (12/06/2021)   PRAPARE - Administrator, Civil Service (Medical): No    Lack of Transportation (Non-Medical): No  Physical Activity: Not on file  Stress: No Stress Concern Present (12/06/2021)   [employer] of Occupational Health - Occupational Stress Questionnaire    Feeling of Stress : Not at all  Social Connections: Socially Integrated (12/06/2021)   Social Connection and Isolation Panel [NHANES]    Frequency of Communication with Friends and Family: More than three times a week    Frequency of Social Gatherings with Friends and Family: More than three times a week    Attends Religious Services: 1 to 4 times per year    Active Member of [employer] or Organizations: Yes    Attends Banker Meetings: 1 to 4 times per year     Marital Status: Married  Catering manager Violence: Not At Risk (12/06/2021)   Humiliation, Afraid, Rape, and Kick questionnaire    Fear of Current or Ex-Partner: No    Emotionally Abused: No    Physically Abused: No    Sexually Abused: No   Family History  Problem Relation Age of Onset   Diabetes Mother    Hypertension Mother    Diabetes Sister    Hypertension Sister    Colon polyps Sister    Diabetes Brother    Early death Son    Diabetes Maternal Aunt    Colon cancer Maternal Aunt    Diabetes Maternal Uncle    Esophageal cancer Neg Hx    Rectal cancer Neg Hx    Stomach cancer Neg Hx     Objective: Office vital signs reviewed. BP Marland Kitchen)  114/57   Pulse 76   Temp (!) 97.3 F (36.3 C)   Ht 5\' 4"  (1.626 m)   Wt 183 lb 9.6 oz (83.3 kg)   BMI 31.51 kg/m   Physical Examination:  General: Awake, alert, well nourished, No acute distress HEENT: Sclera white.  No exophthalmos.  No goiter Cardio: regular rate and rhythm, S1S2 heard, no murmurs appreciated Pulm: clear to auscultation bilaterally, no wheezes, rhonchi or rales; normal work of breathing on room air Back: Postsurgical vertical scar noted along the lumbar spine.  No evidence of dehiscence. Extremities see below  Diabetic Foot Exam - Simple   Simple Foot Form Diabetic Foot exam was performed with the following findings: Yes 10/17/2022  1:03 PM  Visual Inspection No deformities, no ulcerations, no other skin breakdown bilaterally: Yes Sensation Testing Intact to touch and monofilament testing bilaterally: Yes Pulse Check Posterior Tibialis and Dorsalis pulse intact bilaterally: Yes Comments     Assessment/ Plan: 67 y.o. female   Type 2 diabetes mellitus with obesity (HCC) - Plan: Bayer DCA Hb A1c Waived, CMP14+EGFR  Need for shingles vaccine - Plan: Zoster Recombinant (Shingrix )  Need for pneumococcal vaccine - Plan: Pneumococcal conjugate vaccine 20-valent (Prevnar 20)  Hypertension associated with  diabetes (HCC) - Plan: CMP14+EGFR  Hyperlipidemia associated with type 2 diabetes mellitus (HCC) - Plan: CMP14+EGFR, Lipid Panel  Acquired hypothyroidism - Plan: TSH, T4, Free  History of back surgery - Plan: CBC  Sugar remains well-controlled.  No changes.  We will complete ROI for diabetic eye exam.  Diabetic foot exam completed today.  Shingles and pneumococcal vaccination administered.  Plan for shingles #2 in 6 months at her follow-up  Blood pressure remains well-controlled.  No changes.  Check fasting lipid.  Continue statin.  Asymptomatic from a thyroid standpoint.  Check thyroid levels  Seems to be recovering from back surgery without difficulty  Orders Placed This Encounter  Procedures   Pneumococcal conjugate vaccine 20-valent (Prevnar 20)   Zoster Recombinant (Shingrix )   No orders of the defined types were placed in this encounter.    Raliegh Ip, DO [facility] [phone]

## 2022-10-18 LAB — CMP14+EGFR
ALT: 39 IU/L — ABNORMAL HIGH (ref 0–32)
AST: 32 IU/L (ref 0–40)
Albumin: 4.1 g/dL (ref 3.9–4.9)
Alkaline Phosphatase: 154 IU/L — ABNORMAL HIGH (ref 44–121)
BUN/Creatinine Ratio: 12 (ref 12–28)
BUN: 8 mg/dL (ref 8–27)
Bilirubin Total: 0.4 mg/dL (ref 0.0–1.2)
CO2: 21 mmol/L (ref 20–29)
Calcium: 9 mg/dL (ref 8.7–10.3)
Chloride: 103 mmol/L (ref 96–106)
Creatinine, Ser: 0.69 mg/dL (ref 0.57–1.00)
Globulin, Total: 2.9 g/dL (ref 1.5–4.5)
Glucose: 82 mg/dL (ref 70–99)
Potassium: 4 mmol/L (ref 3.5–5.2)
Sodium: 143 mmol/L (ref 134–144)
Total Protein: 7 g/dL (ref 6.0–8.5)
eGFR: 96 mL/min/{1.73_m2} (ref >59)

## 2022-10-18 LAB — CBC
Hematocrit: 36 % (ref 34.0–46.6)
Hemoglobin: 11.7 g/dL (ref 11.1–15.9)
MCH: 27.5 pg (ref 26.6–33.0)
MCHC: 32.5 g/dL (ref 31.5–35.7)
MCV: 85 fL (ref 79–97)
Platelets: 363 10*3/uL (ref 150–450)
RBC: 4.25 x10E6/uL (ref 3.77–5.28)
RDW: 14.2 % (ref 11.7–15.4)
WBC: 7.6 10*3/uL (ref 3.4–10.8)

## 2022-10-18 LAB — LIPID PANEL
Chol/HDL Ratio: 3.2 ratio (ref 0.0–4.4)
Cholesterol, Total: 175 mg/dL (ref 100–199)
HDL: 55 mg/dL (ref >39)
LDL Chol Calc (NIH): 103 mg/dL — ABNORMAL HIGH (ref 0–99)
Triglycerides: 93 mg/dL (ref 0–149)
VLDL Cholesterol Cal: 17 mg/dL (ref 5–40)

## 2022-10-18 LAB — TSH: TSH: 1.26 u[IU]/mL (ref 0.450–4.500)

## 2022-10-18 LAB — T4, FREE: Free T4: 1.43 ng/dL (ref 0.82–1.77)

## 2022-11-15 ENCOUNTER — Other Ambulatory Visit: Payer: Self-pay | Admitting: Family Medicine

## 2022-11-15 DIAGNOSIS — E1169 Type 2 diabetes mellitus with other specified complication: Secondary | ICD-10-CM

## 2022-11-21 ENCOUNTER — Other Ambulatory Visit (HOSPITAL_COMMUNITY): Payer: Self-pay | Admitting: Family Medicine

## 2022-11-21 DIAGNOSIS — Z1231 Encounter for screening mammogram for malignant neoplasm of breast: Secondary | ICD-10-CM

## 2022-12-26 ENCOUNTER — Ambulatory Visit (HOSPITAL_COMMUNITY)
Admission: RE | Admit: 2022-12-26 | Discharge: 2022-12-26 | Disposition: A | Discharge status: Home / Self Care | Payer: BC Managed Care – PPO | Source: Ambulatory Visit | Attending: Family Medicine | Admitting: Family Medicine

## 2022-12-26 ENCOUNTER — Encounter (HOSPITAL_COMMUNITY): Payer: Self-pay

## 2022-12-26 DIAGNOSIS — Z1231 Encounter for screening mammogram for malignant neoplasm of breast: Secondary | ICD-10-CM | POA: Diagnosis not present

## 2023-01-26 ENCOUNTER — Telehealth: Payer: Self-pay | Admitting: Family Medicine

## 2023-01-26 DIAGNOSIS — E039 Hypothyroidism, unspecified: Secondary | ICD-10-CM

## 2023-01-26 MED ORDER — LEVOTHYROXINE SODIUM 100 MCG PO TABS
ORAL_TABLET | ORAL | 2 refills | Status: DC
Start: 2023-01-26 — End: 2023-04-24

## 2023-01-26 NOTE — Telephone Encounter (Signed)
LMOVM refill sent to pharmacy 

## 2023-01-26 NOTE — Telephone Encounter (Signed)
  Prescription Request  01/26/2023  Is this a "Controlled Substance" medicine?   Have you seen your PCP in the last 2 weeks? no  If YES, route message to pool  -  If NO, patient needs to be scheduled for appointment.  What is the name of the medication or equipment?  levothyroxine (SYNTHROID) 100 MCG tablet  Have you contacted your pharmacy to request a refill? yes   Which pharmacy would you like this sent to? [facility] in [place]   *Patient is out    Patient notified that their request is being sent to the clinical staff for review and that they should receive a response within 2 business days.

## 2023-02-02 DIAGNOSIS — Z23 Encounter for immunization: Secondary | ICD-10-CM | POA: Diagnosis not present

## 2023-02-16 ENCOUNTER — Other Ambulatory Visit: Payer: Self-pay | Admitting: Family Medicine

## 2023-02-16 DIAGNOSIS — E1159 Type 2 diabetes mellitus with other circulatory complications: Secondary | ICD-10-CM

## 2023-02-26 ENCOUNTER — Other Ambulatory Visit: Payer: Self-pay | Admitting: Family Medicine

## 2023-02-26 DIAGNOSIS — E1169 Type 2 diabetes mellitus with other specified complication: Secondary | ICD-10-CM

## 2023-03-16 DIAGNOSIS — Z6832 Body mass index (BMI) 32.0-32.9, adult: Secondary | ICD-10-CM | POA: Diagnosis not present

## 2023-03-16 DIAGNOSIS — M4316 Spondylolisthesis, lumbar region: Secondary | ICD-10-CM | POA: Diagnosis not present

## 2023-03-31 ENCOUNTER — Other Ambulatory Visit: Payer: Self-pay | Admitting: Family Medicine

## 2023-03-31 DIAGNOSIS — E1169 Type 2 diabetes mellitus with other specified complication: Secondary | ICD-10-CM

## 2023-04-16 ENCOUNTER — Other Ambulatory Visit (HOSPITAL_COMMUNITY): Payer: Self-pay

## 2023-04-21 ENCOUNTER — Telehealth: Payer: Self-pay

## 2023-04-21 ENCOUNTER — Other Ambulatory Visit (HOSPITAL_COMMUNITY): Payer: Self-pay

## 2023-04-21 NOTE — Telephone Encounter (Signed)
 Pharmacy Patient Advocate Encounter   Received notification from  [employer]  that prior authorization for Rybelsus  14MG  tablets is required/requested.   Insurance verification completed.   The patient is insured through [facility] .   Per test claim: Refill too soon. PA is not needed at this time. Medication was filled 02-19-2023. Next eligible fill date is 04-28-2023.   Patient must call [phone] as insurance requires mail order after 3 retail fills.

## 2023-04-24 ENCOUNTER — Ambulatory Visit: Payer: BC Managed Care – PPO | Admitting: Family Medicine

## 2023-04-24 ENCOUNTER — Encounter: Payer: Self-pay | Admitting: Family Medicine

## 2023-04-24 VITALS — BP 140/82 | HR 58 | Temp 97.9°F | Ht 64.0 in | Wt 191.0 lb

## 2023-04-24 DIAGNOSIS — I152 Hypertension secondary to endocrine disorders: Secondary | ICD-10-CM | POA: Diagnosis not present

## 2023-04-24 DIAGNOSIS — E1169 Type 2 diabetes mellitus with other specified complication: Secondary | ICD-10-CM

## 2023-04-24 DIAGNOSIS — E669 Obesity, unspecified: Secondary | ICD-10-CM | POA: Diagnosis not present

## 2023-04-24 DIAGNOSIS — E785 Hyperlipidemia, unspecified: Secondary | ICD-10-CM

## 2023-04-24 DIAGNOSIS — Z7984 Long term (current) use of oral hypoglycemic drugs: Secondary | ICD-10-CM

## 2023-04-24 DIAGNOSIS — E1159 Type 2 diabetes mellitus with other circulatory complications: Secondary | ICD-10-CM

## 2023-04-24 DIAGNOSIS — E039 Hypothyroidism, unspecified: Secondary | ICD-10-CM | POA: Diagnosis not present

## 2023-04-24 DIAGNOSIS — Z23 Encounter for immunization: Secondary | ICD-10-CM

## 2023-04-24 LAB — BAYER DCA HB A1C WAIVED: HB A1C (BAYER DCA - WAIVED): 5.9 % — ABNORMAL HIGH (ref 4.8–5.6)

## 2023-04-24 MED ORDER — RYBELSUS 14 MG PO TABS: 1.0000 | ORAL_TABLET | Freq: Every day | ORAL | 3 refills | Status: DC

## 2023-04-24 MED ORDER — AMLODIPINE-OLMESARTAN 10-40 MG PO TABS: 1.0000 | ORAL_TABLET | Freq: Every day | ORAL | 3 refills | Status: DC

## 2023-04-24 MED ORDER — LEVOTHYROXINE SODIUM 100 MCG PO TABS: ORAL_TABLET | ORAL | 3 refills | Status: DC

## 2023-04-24 NOTE — Progress Notes (Signed)
Subjective: CC:DM PCP: Lisa Ip, DO UJW:JXBJY Stephens is a 68 y.o. female presenting to clinic today for:  1. Type 2 Diabetes with hypertension, hyperlipidemia:  Reports compliance with Azor, Rybelsus and Lipitor. Diabetes Health Maintenance Due  Topic Date Due   HEMOGLOBIN A1C  04/19/2023   OPHTHALMOLOGY EXAM  07/07/2023   FOOT EXAM  10/17/2023    Last A1c:  Lab Results  Component Value Date   HGBA1C 5.9 (H) 10/17/2022    ROS: Denies chest pain, shortness of breath, dizziness, edema, nausea, vomiting or abdominal pain.  She does occasionally have a low blood sugar below 70 but this is rare and seems to be related to physical, strenuous exercise.  2.  Hypothyroidism Compliant with Synthroid 100 mcg daily.  No tremor, heart palpitations or changes in bowel habits.  ROS: Per HPI  Allergies  Allergen Reactions   Voltaren [Diclofenac Sodium] Shortness Of Breath and Other (See Comments)    Wheezing, coughing, runny nose   Past Medical History:  Diagnosis Date   Arthritis    Blood transfusion without reported diagnosis    Cataract    small beginning   Depression    Diabetes mellitus without complication (HCC)    Hyperlipidemia    Hypertension    Hypothyroidism     Current Outpatient Medications:    acetaminophen (TYLENOL) 500 MG tablet, Take 1 tablet (500 mg total) by mouth every 6 (six) hours as needed., Disp: 30 tablet, Rfl: 0   amLODipine-olmesartan (AZOR) 10-40 MG tablet, TAKE 1 TABLET BY MOUTH DAILY, Disp: 90 tablet, Rfl: 0   aspirin EC 81 MG tablet, Take 81 mg by mouth daily., Disp: , Rfl:    atorvastatin (LIPITOR) 20 MG tablet, TAKE 1 TABLET BY MOUTH 3 DAYS  WEEKLY FOR CHOLESTEROL, Disp: 39 tablet, Rfl: 0   Blood Glucose Monitoring Suppl (CONTOUR NEXT MONITOR) w/Device KIT, , Disp: , Rfl:    Cholecalciferol (VITAMIN D-3) 125 MCG (5000 UT) TABS, Take 5,000 Units by mouth daily., Disp: , Rfl:    Coenzyme Q10 (CO Q 10 PO), Take 500 mg by mouth daily.,  Disp: , Rfl:    CONTOUR NEXT TEST test strip, , Disp: , Rfl:    famotidine (PEPCID) 20 MG tablet, Take 1 tablet (20 mg total) by mouth 2 (two) times daily for 14 days. For reflux (Patient not taking: Reported on 08/07/2022), Disp: 28 tablet, Rfl: 0   glucose blood (CONTOUR NEXT TEST) test strip, as directed per package instructions In Vitro twice a day for 90 days, Disp: , Rfl:    hydrOXYzine (VISTARIL) 25 MG capsule, Take 1 capsule (25 mg total) by mouth every 8 (eight) hours as needed for itching., Disp: 30 capsule, Rfl: 0   Lancets (ONETOUCH ULTRASOFT) lancets, 1 Device by Misc.(Non-Drug; Combo Route) route daily., Disp: , Rfl:    levothyroxine (SYNTHROID) 100 MCG tablet, TAKE 1 TABLET BY MOUTH  DAILY BEFORE BREAKFAST, Disp: 90 tablet, Rfl: 2   methocarbamol (ROBAXIN) 500 MG tablet, Take 1 tablet (500 mg total) by mouth every 8 (eight) hours as needed for muscle spasms., Disp: 60 tablet, Rfl: 1   Multiple Vitamins-Minerals (MULTIVITAMIN WITH MINERALS) tablet, Take 1 tablet by mouth daily., Disp: , Rfl:    naproxen (NAPROSYN) 500 MG tablet, Take 1 tablet (500 mg total) by mouth 2 (two) times daily with a meal., Disp: 30 tablet, Rfl: 0   Omega-3 Fatty Acids (FISH OIL) 1000 MG CAPS, Take 1,000 mg by mouth daily., Disp: , Rfl:  RYBELSUS 14 MG TABS, Take 1 tablet by mouth once daily, Disp: 90 tablet, Rfl: 0 Social History   Socioeconomic History   Marital status: Single    Spouse name: charles   Number of children: Not on file   Years of education: Not on file   Highest education level: Not on file  Occupational History   Not on file  Tobacco Use   Smoking status: Never   Smokeless tobacco: Never  Vaping Use   Vaping status: Never Used  Substance and Sexual Activity   Alcohol use: No   Drug use: No   Sexual activity: Not on file  Other Topics Concern   Not on file  Social History Narrative   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)    Social Drivers of Research scientist (physical sciences) Strain: Low Risk  (12/06/2021)   Overall Financial Resource Strain (CARDIA)    Difficulty of Paying Living Expenses: Not hard at all  Food Insecurity: No Food Insecurity (12/06/2021)   Hunger Vital Sign    Worried About Running Out of Food in the Last Year: Never true    Ran Out of Food in the Last Year: Never true  Transportation Needs: No Transportation Needs (12/06/2021)   PRAPARE - Administrator, Civil Service (Medical): No    Lack of Transportation (Non-Medical): No  Physical Activity: Not on file  Stress: No Stress Concern Present (12/06/2021)   [employer] of Occupational Health - Occupational Stress Questionnaire    Feeling of Stress : Not at all  Social Connections: Socially Integrated (12/06/2021)   Social Connection and Isolation Panel [NHANES]    Frequency of Communication with Friends and Family: More than three times a week    Frequency of Social Gatherings with Friends and Family: More than three times a week    Attends Religious Services: 1 to 4 times per year    Active Member of [employer] or Organizations: Yes    Attends Banker Meetings: 1 to 4 times per year    Marital Status: Married  Catering manager Violence: Not At Risk (12/06/2021)   Humiliation, Afraid, Rape, and Kick questionnaire    Fear of Current or Ex-Partner: No    Emotionally Abused: No    Physically Abused: No    Sexually Abused: No   Family History  Problem Relation Age of Onset   Diabetes Mother    Hypertension Mother    Diabetes Sister    Hypertension Sister    Colon polyps Sister    Diabetes Brother    Early death Son    Diabetes Maternal Aunt    Colon cancer Maternal Aunt    Diabetes Maternal Uncle    Esophageal cancer Neg Hx    Rectal cancer Neg Hx    Stomach cancer Neg Hx     Objective: Office vital signs reviewed. BP (!) 155/76   Pulse (!) 58   Temp 97.9 F (36.6 C)   Ht 5\' 4"  (1.626 m)   Wt 191 lb (86.6 kg)   SpO2 97%   BMI 32.79 kg/m    Physical Examination:  General: Awake, alert, well nourished, No acute distress HEENT: sclera white, MMM Cardio: regular rate and rhythm, S1S2 heard, no murmurs appreciated Pulm: clear to auscultation bilaterally, no wheezes, rhonchi or rales; normal work of breathing on room air   Assessment/ Plan: 68 y.o. female   Type 2 diabetes mellitus with obesity (HCC) - Plan: Microalbumin /  creatinine urine ratio, Bayer DCA Hb A1c Waived, CMP14+EGFR, Semaglutide (RYBELSUS) 14 MG TABS  Long term (current) use of oral hypoglycemic drugs  Hypertension associated with diabetes (HCC) - Plan: CMP14+EGFR, amLODipine-olmesartan (AZOR) 10-40 MG tablet  Hyperlipidemia associated with type 2 diabetes mellitus (HCC) - Plan: CMP14+EGFR  Acquired hypothyroidism - Plan: TSH + free T4, levothyroxine (SYNTHROID) 100 MCG tablet  Sugar at goal with A1c at 5.9 today.  No changes.  Check urine microalbumin, renal function  Blood pressure is controlled upon recheck.  No changes needed.  Refill sent  Continue statin several days per week as tolerated.  Not due for fasting lipid.  Check liver function tests  Asymptomatic from a thyroid standpoint.  Check thyroid levels.  Synthroid renewed  Tetanus and shingles vaccination administered today  Lisa Ip, DO [facility] [phone]

## 2023-04-24 NOTE — Addendum Note (Signed)
Addended by: Waynette Buttery on: 04/24/2023 02:01 PM   Modules accepted: Orders

## 2023-04-25 LAB — CMP14+EGFR
ALT: 44 [IU]/L — ABNORMAL HIGH (ref 0–32)
AST: 30 [IU]/L (ref 0–40)
Albumin: 4.1 g/dL (ref 3.9–4.9)
Alkaline Phosphatase: 130 [IU]/L — ABNORMAL HIGH (ref 44–121)
BUN/Creatinine Ratio: 15 (ref 12–28)
BUN: 9 mg/dL (ref 8–27)
Bilirubin Total: 0.4 mg/dL (ref 0.0–1.2)
CO2: 26 mmol/L (ref 20–29)
Calcium: 9.7 mg/dL (ref 8.7–10.3)
Chloride: 103 mmol/L (ref 96–106)
Creatinine, Ser: 0.62 mg/dL (ref 0.57–1.00)
Globulin, Total: 2.6 g/dL (ref 1.5–4.5)
Glucose: 84 mg/dL (ref 70–99)
Potassium: 4.2 mmol/L (ref 3.5–5.2)
Sodium: 141 mmol/L (ref 134–144)
Total Protein: 6.7 g/dL (ref 6.0–8.5)
eGFR: 98 mL/min/{1.73_m2} (ref >59)

## 2023-04-25 LAB — TSH+FREE T4
Free T4: 1.26 ng/dL (ref 0.82–1.77)
TSH: 3.23 u[IU]/mL (ref 0.450–4.500)

## 2023-04-25 LAB — MICROALBUMIN / CREATININE URINE RATIO
Creatinine, Urine: 49.5 mg/dL
Microalb/Creat Ratio: <6 mg/g{creat} (ref 0–29)
Microalbumin, Urine: <3 ug/mL

## 2023-04-27 ENCOUNTER — Encounter: Payer: Self-pay | Admitting: Family Medicine

## 2023-06-03 ENCOUNTER — Other Ambulatory Visit (HOSPITAL_COMMUNITY): Payer: Self-pay

## 2023-06-03 ENCOUNTER — Telehealth: Payer: Self-pay | Admitting: Pharmacy Technician

## 2023-06-03 NOTE — Telephone Encounter (Signed)
 Pharmacy Patient Advocate Encounter   Received notification from [employer] that prior authorization for RYBELSUS 14MG  TABLETS is required/requested.   Insurance verification completed.   The patient is insured through [facility] .   Per test claim: PA required; PA submitted to above mentioned insurance via CoverMyMeds Key/confirmation #/EOC [id number] Status is pending

## 2023-06-03 NOTE — Telephone Encounter (Signed)
 Pharmacy Patient Advocate Encounter  Received notification from [facility] that Prior Authorization for RYBELSUS 14MG  TABLETS has been APPROVED from 06/03/2023 to 028/26/2026. Ran test claim, Copay is $50.00. This test claim was processed through [facility]- copay amounts may vary at other pharmacies due to pharmacy/plan contracts, or as the patient moves through the different stages of their insurance plan.   PA #/Case ID/Reference #: [id number]

## 2023-06-07 ENCOUNTER — Other Ambulatory Visit: Payer: Self-pay | Admitting: Family Medicine

## 2023-06-07 DIAGNOSIS — E1169 Type 2 diabetes mellitus with other specified complication: Secondary | ICD-10-CM

## 2023-08-25 ENCOUNTER — Encounter: Payer: Self-pay | Admitting: Family Medicine

## 2023-09-23 ENCOUNTER — Telehealth: Payer: Self-pay | Admitting: Family Medicine

## 2023-11-02 ENCOUNTER — Other Ambulatory Visit: Payer: Self-pay | Admitting: Family Medicine

## 2023-11-02 DIAGNOSIS — E1169 Type 2 diabetes mellitus with other specified complication: Secondary | ICD-10-CM

## 2023-11-18 ENCOUNTER — Other Ambulatory Visit: Payer: Self-pay

## 2023-11-18 ENCOUNTER — Telehealth: Payer: Self-pay | Admitting: Family Medicine

## 2023-11-18 ENCOUNTER — Other Ambulatory Visit: Payer: Self-pay | Admitting: *Deleted

## 2023-11-18 DIAGNOSIS — E1169 Type 2 diabetes mellitus with other specified complication: Secondary | ICD-10-CM

## 2023-11-18 DIAGNOSIS — E039 Hypothyroidism, unspecified: Secondary | ICD-10-CM

## 2023-11-18 DIAGNOSIS — E1159 Type 2 diabetes mellitus with other circulatory complications: Secondary | ICD-10-CM

## 2023-11-18 MED ORDER — AMLODIPINE-OLMESARTAN 10-40 MG PO TABS: 1.0000 | ORAL_TABLET | Freq: Every day | ORAL | 0 refills | Status: DC

## 2023-11-18 MED ORDER — ATORVASTATIN CALCIUM 20 MG PO TABS: ORAL_TABLET | ORAL | 0 refills | Status: DC

## 2023-11-18 MED ORDER — LEVOTHYROXINE SODIUM 100 MCG PO TABS: ORAL_TABLET | ORAL | 1 refills | Status: DC

## 2023-11-18 NOTE — Telephone Encounter (Signed)
 Blank phone note

## 2023-11-20 ENCOUNTER — Other Ambulatory Visit: Payer: Self-pay | Admitting: *Deleted

## 2023-11-20 DIAGNOSIS — E039 Hypothyroidism, unspecified: Secondary | ICD-10-CM

## 2023-11-20 DIAGNOSIS — E1169 Type 2 diabetes mellitus with other specified complication: Secondary | ICD-10-CM

## 2023-11-20 DIAGNOSIS — E1159 Type 2 diabetes mellitus with other circulatory complications: Secondary | ICD-10-CM

## 2023-11-20 MED ORDER — LEVOTHYROXINE SODIUM 100 MCG PO TABS: ORAL_TABLET | ORAL | 1 refills | Status: DC

## 2023-11-20 MED ORDER — ATORVASTATIN CALCIUM 20 MG PO TABS: ORAL_TABLET | ORAL | 0 refills | Status: DC

## 2023-11-20 MED ORDER — AMLODIPINE-OLMESARTAN 10-40 MG PO TABS: 1.0000 | ORAL_TABLET | Freq: Every day | ORAL | 0 refills | Status: DC

## 2023-11-25 ENCOUNTER — Other Ambulatory Visit (HOSPITAL_COMMUNITY): Payer: Self-pay | Admitting: Family Medicine

## 2023-11-25 DIAGNOSIS — Z1231 Encounter for screening mammogram for malignant neoplasm of breast: Secondary | ICD-10-CM

## 2023-11-30 ENCOUNTER — Ambulatory Visit: Payer: Self-pay

## 2023-11-30 NOTE — Telephone Encounter (Signed)
 Pt has appt

## 2023-11-30 NOTE — Telephone Encounter (Signed)
  FYI Only or Action Required?: FYI only for provider.  Patient was last seen in primary care on 04/24/2023 by Jolinda Norene HERO, DO.  Called Nurse Triage reporting Back Pain.  Symptoms began 3 days ago later in the day after an exercise she doesn't normally do.  Interventions attempted: OTC medications: Aleve , Prescription medications: muscle relaxers from a previous back surgery, and Rest, hydration, or home remedies.  Symptoms are: gradually worsening.  Triage Disposition: See PCP When Office is Open (Within 3 Days)  Patient/caregiver understands and will follow disposition?: Yes                   Copied from CRM #[id number]. Topic: Clinical - Red Word Triage >> Nov 30, 2023  8:41 AM Farrel B wrote: [facility] that prompted transfer to Nurse Triage: back  pain lower middle, pain radiating to left leg!    ----------------------------------------------------------------------- From previous Reason for Contact - Scheduling: Patient/patient representative is calling to schedule an appointment. Refer to attachments for appointment information. Reason for Disposition  [1] Pain radiates into the thigh or further down the leg AND [2] one leg  Answer Assessment - Initial Assessment Questions Patient offered morning appointments tomorrow at her PCP office with different providers but she wants to wait until the 3 PM opening tomorrow 12/01/2023 to see her provider Dr Norene Jolinda Patient is advised that if anything worsens to go to the Emergency Room. Patient verbalized understanding.    1. ONSET: When did the pain begin? (e.g., minutes, hours, days)     Friday evening pain started  Patient exercised Friday morning 2. LOCATION: Where does it hurt? (upper, mid or lower back)     Lower back and into left hip 3. SEVERITY: How bad is the pain?  (e.g., Scale 1-10; mild, moderate, or severe)     Ok with rest but 7 when moving and walking on it 4. PATTERN: Is the pain  constant? (e.g., yes, no; constant, intermittent)      Comes and goes 5. RADIATION: Does the pain shoot into your legs or somewhere else?     Down into left hip and left leg 6. CAUSE:  What do you think is causing the back pain?      Possibly an exercise 7. BACK OVERUSE:  Any recent lifting of heavy objects, strenuous work or exercise?     Exercise 3 days ago 8. MEDICINES: What have you taken so far for the pain? (e.g., nothing, acetaminophen , NSAIDS)     Aleve , cream, meds left over from back surgery last year---muscle relaxers one last night and one early this morning 9. NEUROLOGIC SYMPTOMS: Do you have any weakness, numbness, or problems with bowel/bladder control?     No 10. OTHER SYMPTOMS: Do you have any other symptoms? (e.g., fever, abdomen pain, burning with urination, blood in urine)       No  Protocols used: Back Pain-A-AH

## 2023-12-01 ENCOUNTER — Ambulatory Visit (INDEPENDENT_AMBULATORY_CARE_PROVIDER_SITE_OTHER)

## 2023-12-01 ENCOUNTER — Encounter: Payer: Self-pay | Admitting: Family Medicine

## 2023-12-01 ENCOUNTER — Ambulatory Visit (INDEPENDENT_AMBULATORY_CARE_PROVIDER_SITE_OTHER): Admitting: Family Medicine

## 2023-12-01 VITALS — BP 141/77 | HR 65 | Temp 97.6°F | Ht 64.0 in | Wt 192.4 lb

## 2023-12-01 DIAGNOSIS — M51369 Other intervertebral disc degeneration, lumbar region without mention of lumbar back pain or lower extremity pain: Secondary | ICD-10-CM

## 2023-12-01 DIAGNOSIS — M47817 Spondylosis without myelopathy or radiculopathy, lumbosacral region: Secondary | ICD-10-CM | POA: Diagnosis not present

## 2023-12-01 DIAGNOSIS — Z7984 Long term (current) use of oral hypoglycemic drugs: Secondary | ICD-10-CM | POA: Diagnosis not present

## 2023-12-01 DIAGNOSIS — E1169 Type 2 diabetes mellitus with other specified complication: Secondary | ICD-10-CM | POA: Diagnosis not present

## 2023-12-01 DIAGNOSIS — E669 Obesity, unspecified: Secondary | ICD-10-CM | POA: Diagnosis not present

## 2023-12-01 DIAGNOSIS — M4727 Other spondylosis with radiculopathy, lumbosacral region: Secondary | ICD-10-CM

## 2023-12-01 DIAGNOSIS — M4316 Spondylolisthesis, lumbar region: Secondary | ICD-10-CM

## 2023-12-01 MED ORDER — PREDNISONE 20 MG PO TABS: 40.0000 mg | ORAL_TABLET | Freq: Every day | ORAL | 0 refills | Status: AC

## 2023-12-01 MED ORDER — METHYLPREDNISOLONE ACETATE 40 MG/ML IJ SUSP
40.0000 mg | Freq: Once | INTRAMUSCULAR | Status: AC
  Administered 2023-12-01: 40 mg via INTRAMUSCULAR

## 2023-12-01 MED ORDER — METHYLPREDNISOLONE ACETATE 80 MG/ML IJ SUSP: 80.0000 mg | Freq: Once | INTRAMUSCULAR | Status: DC

## 2023-12-01 NOTE — Patient Instructions (Addendum)
 Call Dr Shelagh office and set up a visit Start the prednisone  by mouth with breakfast tomorrow Referral to physical therapy placed today Your next visit is actually your physical so keep that appointment (it will be a free visit since it is your physical). We'll do fasting labs that visit and follow up on how your back is feeling. I also referred you to Mliss, our pharmacist. Rosine will get called to talk about Ozempic  patient assistance program and she'll work with you transitioning from Rybelsus  to the shot instead.

## 2023-12-01 NOTE — Progress Notes (Signed)
 Subjective: RR:ajrx pain PCP: Jolinda Norene HERO, DO YEP:Lisa Stephens is a 68 y.o. female presenting to clinic today for:  Discussed the use of AI scribe software for clinical note transcription with the patient, who gave verbal consent to proceed.  History of Present Illness   Lisa Stephens is a 68 year old female with lumbar spondylolisthesis and spinal stenosis who presents with low back pain.  She experiences low back pain primarily on the left side, a change from her previous right-sided pain. The pain is sharp and severe, exacerbated by lying on her left side and certain movements. It began after a video workout at home, following her usual routine of walking five miles on a trail. The pain can escalate to a 7 or 8 out of 10 in the evenings.  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 She has been using methocarbamol  for muscle spasms, which she resumed taking recently. Aspirin  has provided some relief, but she cannot take diclofenac due to shortness of breath. She has tolerated prednisone  in the past.  She reports a cramp in her side and down her hip that persists, and tingling in her fingers on the left side that resolved. No urinary changes or symptoms suggestive of a kidney stone. She notes difficulty squatting and reports a cramp in her leg, but denies numbness or tingling down the leg.  Her social history includes retirement and engaging in regular physical activity, including walking and video workouts. Despite increased physical activity, she mentions gaining weight and is currently on Rybelsus  for diabetes management.  Some difficulty with cost. Did not know she could not use coupons with medicare.         ROS: Per HPI  Allergies  Allergen Reactions   Voltaren [Diclofenac Sodium] Shortness Of Breath and Other (See Comments)    Wheezing, coughing, runny nose    Past Medical History:  Diagnosis Date   Arthritis    Blood transfusion without reported diagnosis    Cataract    small beginning   Depression    Diabetes mellitus without complication (HCC)    Fracture of distal end of right radius 08/21/2017   Hyperlipidemia    Hypertension    Hypothyroidism     Current Outpatient Medications:    acetaminophen  (TYLENOL ) 500 MG tablet, Take 1 tablet (500 mg total) by mouth every 6 (six) hours as needed., Disp: 30 tablet, Rfl: 0   amLODipine -olmesartan  (AZOR ) 10-40 MG tablet, Take 1 tablet by mouth daily., Disp: 100 tablet, Rfl: 0   aspirin  EC 81 MG tablet, Take 81 mg by mouth daily., Disp: , Rfl:    atorvastatin  (LIPITOR) 20 MG tablet, TAKE 1 TABLET BY MOUTH 3 DAYS  WEEKLY FOR CHOLESTEROL, Disp: 39 tablet, Rfl: 0   Blood Glucose Monitoring Suppl (CONTOUR NEXT MONITOR) w/Device KIT, , Disp: , Rfl:    Cholecalciferol  (VITAMIN D -3) 125 MCG (5000 UT) TABS, Take 5,000 Units by mouth daily., Disp: , Rfl:    CONTOUR NEXT TEST test strip, , Disp: , Rfl:    glucose blood (CONTOUR NEXT TEST) test strip, as directed per package instructions In Vitro twice a day for 90 days, Disp: , Rfl:    Lancets (ONETOUCH ULTRASOFT) lancets, 1 Device by Misc.(Non-Drug; Combo Route) route daily., Disp: , Rfl:    levothyroxine  (SYNTHROID ) 100 MCG tablet, TAKE 1 TABLET BY MOUTH  DAILY BEFORE BREAKFAST, Disp: 100 tablet, Rfl:  1   Multiple Vitamins-Minerals (MULTIVITAMIN WITH MINERALS) tablet, Take 1 tablet by mouth daily., Disp: , Rfl:    Omega-3 Fatty Acids (FISH OIL) 1000 MG CAPS, Take 1,000 mg by mouth daily., Disp: , Rfl:    predniSONE  (DELTASONE ) 20 MG tablet, Take 2 tablets (40 mg total) by mouth daily with breakfast for 3 days. Start 12/02/23, Disp: 6 tablet, Rfl: 0   Semaglutide  (RYBELSUS ) 14 MG TABS, Take 1 tablet (14 mg total) by mouth daily., Disp: 100 tablet, Rfl: 3   Coenzyme Q10 (CO Q 10 PO), Take 500 mg by mouth daily. (Patient not taking: Reported on 12/01/2023),  Disp: , Rfl:    methocarbamol  (ROBAXIN ) 500 MG tablet, Take 1 tablet (500 mg total) by mouth every 8 (eight) hours as needed for muscle spasms., Disp: 60 tablet, Rfl: 1  Current Facility-Administered Medications:    methylPREDNISolone  acetate (DEPO-MEDROL ) injection 40 mg, 40 mg, Intramuscular, Once,  Social History   Socioeconomic History   Marital status: Single    Spouse name: charles   Number of children: Not on file   Years of education: Not on file   Highest education level: Not on file  Occupational History   Not on file  Tobacco Use   Smoking status: Never   Smokeless tobacco: Never  Vaping Use   Vaping status: Never Used  Substance and Sexual Activity   Alcohol use: No   Drug use: No   Sexual activity: Not on file  Other Topics Concern   Not on file  Social History Narrative   Works at a Sales executive (hard concrete floors)    Social Drivers of Corporate investment banker Strain: Low Risk  (12/06/2021)   Overall Financial Resource Strain (CARDIA)    Difficulty of Paying Living Expenses: Not hard at all  Food Insecurity: No Food Insecurity (12/06/2021)   Hunger Vital Sign    Worried About Running Out of Food in the Last Year: Never true    Ran Out of Food in the Last Year: Never true  Transportation Needs: No Transportation Needs (12/06/2021)   PRAPARE - Administrator, Civil Service (Medical): No    Lack of Transportation (Non-Medical): No  Physical Activity: Not on file  Stress: No Stress Concern Present (12/06/2021)   [employer] of Occupational Health - Occupational Stress Questionnaire    Feeling of Stress : Not at all  Social Connections: Socially Integrated (12/06/2021)   Social Connection and Isolation Panel    Frequency of Communication with Friends and Family: More than three times a week    Frequency of Social Gatherings with Friends and Family: More than three times a week    Attends Religious Services: 1 to 4 times per year    Active  Member of [employer] or Organizations: Yes    Attends Banker Meetings: 1 to 4 times per year    Marital Status: Married  Catering manager Violence: Not At Risk (12/06/2021)   Humiliation, Afraid, Rape, and Kick questionnaire    Fear of Current or Ex-Partner: No    Emotionally Abused: No    Physically Abused: No    Sexually Abused: No   Family History  Problem Relation Age of Onset   Diabetes Mother    Hypertension Mother    Diabetes Sister    Hypertension Sister    Colon polyps Sister    Diabetes Brother    Early death Son    Diabetes Maternal Aunt  Colon cancer Maternal Aunt    Diabetes Maternal Uncle    Esophageal cancer Neg Hx    Rectal cancer Neg Hx    Stomach cancer Neg Hx     Objective: Office vital signs reviewed. BP (!) 141/77   Pulse 65   Temp 97.6 F (36.4 C)   Ht 5' 4 (1.626 m)   Wt 192 lb 6 oz (87.3 kg)   SpO2 94%   BMI 33.02 kg/m   Physical Examination:  General: Awake, alert, obese, No acute distress HEENT: sclera white, MMM MSK: antalgic gait.  Slow to rise from seated position. 4/5 LLE strength and 5/5 RLE strength. Light touch sensation in tact bilaterally. Mild pain with straight leg raise on left.  Assessment/ Plan: 68 y.o. female   Lumbosacral spondylosis with radiculopathy - Plan: predniSONE  (DELTASONE ) 20 MG tablet, DG Lumbar Spine 2-3 Views, Ambulatory referral to Physical Therapy, methylPREDNISolone  acetate (DEPO-MEDROL ) injection 40 mg, DISCONTINUED: methylPREDNISolone  acetate (DEPO-MEDROL ) injection 80 mg  Lumbar adjacent segment disease with spondylolisthesis - Plan: predniSONE  (DELTASONE ) 20 MG tablet, DG Lumbar Spine 2-3 Views, Ambulatory referral to Physical Therapy, methylPREDNISolone  acetate (DEPO-MEDROL ) injection 40 mg, DISCONTINUED: methylPREDNISolone  acetate (DEPO-MEDROL ) injection 80 mg  Type 2 diabetes mellitus with obesity (HCC) - Plan: AMB Referral [facility] Care Management  Long term (current) use of oral hypoglycemic  drugs - Plan: AMB Referral [facility] Care Management  Assessment and Plan    Low back pain with history of lumbar spondylolisthesis and spinal stenosis, status post lumbar spine surgery Low back pain on the left side, sharp and severe, possibly due to hardware shift or disc issues. No urinary changes or significant numbness/tingling, but weakness noted. Pain management with methocarbamol  and aspirin  partially effective. Prednisone  considered due to previous tolerance. - Administer Depo Medrol  40 injection. - Prescribe prednisone  to start with breakfast. - Order lumbar spine x-ray to assess for hardware shift or other abnormalities. - Refer to physical therapy for soft tissue work and strengthening exercises. - Instruct to monitor for worsening symptoms such as increased weakness, numbness, or changes in bowel/bladder function and report immediately. - Advise to contact [facility] for follow-up if symptoms do not improve with current management.  Type 2 diabetes mellitus w/ obesity Type 2 diabetes mellitus with concern for blood sugar elevation due to prednisone  use. Current management includes Rybelsus , but weight management is a concern. - Monitor blood sugar levels closely, especially during prednisone  treatment. - Refer to clinical pharmacist for potential switch from Rybelsus  to Ozempic  for better weight management and higher dosing flexibility. - Discuss potential switch from Rybelsus  to Ozempic  for enhanced weight management. - Refer to clinical pharmacist for assistance with patient assistance programs for medication coverage.  Follow-Up Follow-up plans discussed for ongoing management and monitoring. - Schedule follow-up appointment on December 14, 2023, for CPE and fasting labs - Instruct to contact the office if physical therapy does not contact her by next week. - Advise to reach out to [facility] for an appointment if symptoms persist or worsen.       Norene CHRISTELLA Fielding,  DO [facility] [phone]

## 2023-12-03 ENCOUNTER — Telehealth: Payer: Self-pay | Admitting: Family Medicine

## 2023-12-03 ENCOUNTER — Telehealth: Payer: Self-pay

## 2023-12-03 DIAGNOSIS — E1169 Type 2 diabetes mellitus with other specified complication: Secondary | ICD-10-CM

## 2023-12-03 DIAGNOSIS — E039 Hypothyroidism, unspecified: Secondary | ICD-10-CM

## 2023-12-03 DIAGNOSIS — E559 Vitamin D deficiency, unspecified: Secondary | ICD-10-CM

## 2023-12-03 DIAGNOSIS — E1159 Type 2 diabetes mellitus with other circulatory complications: Secondary | ICD-10-CM

## 2023-12-03 NOTE — Progress Notes (Signed)
 Care Guide Pharmacy Note  12/03/2023 Name: Lisa Stephens MRN: [id number] DOB: February 17, 1956  Referred By: Jolinda Norene HERO, DO Reason for referral: Complex Care Management (Outreach to schedule with Pharm d )   Lisa Stephens is a 68 y.o. year old female who is a primary care patient of Jolinda Norene HERO, DO.  Lisa Stephens was referred to the pharmacist for assistance related to: DMII  Successful contact was made with the patient to discuss pharmacy services including being ready for the pharmacist to call at least 5 minutes before the scheduled appointment time and to have medication bottles and any blood pressure readings ready for review. The patient agreed to meet with the pharmacist via telephone visit on (date/time).12/18/2023  Jeoffrey Buffalo , RMA     [place]  [facility]  Direct Dial: [phone]  Website: [email or web address]

## 2023-12-03 NOTE — Telephone Encounter (Signed)
 Patient has appt 9-5 for labs. Needs orders put in for Dr. KANDICE appt on 9-8 for physical.

## 2023-12-08 ENCOUNTER — Encounter: Payer: Self-pay | Admitting: Family Medicine

## 2023-12-08 NOTE — Telephone Encounter (Signed)
 Labs are in

## 2023-12-09 ENCOUNTER — Ambulatory Visit: Attending: Family Medicine

## 2023-12-09 DIAGNOSIS — M5459 Other low back pain: Secondary | ICD-10-CM | POA: Insufficient documentation

## 2023-12-09 DIAGNOSIS — M51369 Other intervertebral disc degeneration, lumbar region without mention of lumbar back pain or lower extremity pain: Secondary | ICD-10-CM | POA: Diagnosis not present

## 2023-12-09 DIAGNOSIS — M5416 Radiculopathy, lumbar region: Secondary | ICD-10-CM | POA: Diagnosis not present

## 2023-12-09 DIAGNOSIS — M4727 Other spondylosis with radiculopathy, lumbosacral region: Secondary | ICD-10-CM | POA: Diagnosis not present

## 2023-12-09 DIAGNOSIS — M4316 Spondylolisthesis, lumbar region: Secondary | ICD-10-CM | POA: Diagnosis not present

## 2023-12-09 NOTE — Therapy (Signed)
 OUTPATIENT PHYSICAL THERAPY THORACOLUMBAR EVALUATION   Patient Name: Lisa Stephens MRN: [id number] DOB:[birth year], 68 y.o., female Today's Date: 12/09/2023  END OF SESSION:  PT End of Session - 12/09/23 1025     Visit Number 1    Number of Visits 8    Date for PT Re-Evaluation 02/05/24    PT Start Time 1018    PT Stop Time 1050    PT Time Calculation (min) 32 min    Activity Tolerance Patient tolerated treatment well    Behavior During Therapy [facility] for tasks assessed/performed          Past Medical History:  Diagnosis Date   Arthritis    Blood transfusion without reported diagnosis    Cataract    small beginning   Depression    Diabetes mellitus without complication (HCC)    Fracture of distal end of right radius 08/21/2017   Hyperlipidemia    Hypertension    Hypothyroidism    Past Surgical History:  Procedure Laterality Date   ABDOMINAL HYSTERECTOMY     BACK SURGERY  09/2015   revision   MAXIMUM ACCESS (MAS)POSTERIOR LUMBAR INTERBODY FUSION (PLIF) 2 LEVEL N/A 07/02/2015   Procedure: L3-4 L4-5 Maximum access posterior lumbar interbody fusion;  Surgeon: Lisa Hicks Ditty, MD;  Location: [facility];  Service: Neurosurgery;  Laterality: N/A;  L3-4 L4-5 Maximum access posterior lumbar interbody fusion   TUBAL LIGATION     Patient Active Problem List   Diagnosis Date Noted   Lumbar adjacent segment disease with spondylolisthesis 08/13/2022   Rotator cuff tendinitis, right 04/04/2020   Foot drop, right 09/26/2015   Stress incontinence in female 08/23/2015   Hearing loss 08/23/2015   Lumbosacral spondylosis with radiculopathy 07/02/2015   Hypothyroidism 05/28/2015   Vitamin D  deficiency 05/28/2015   Hypertension associated with diabetes (HCC) 12/08/2013   Obesity 12/08/2013   Hyperlipidemia associated with type 2 diabetes mellitus (HCC) 09/07/2013   Type 2 diabetes mellitus with obesity (HCC) 09/07/2013   REFERRING PROVIDER: Jolinda Norene HERO, DO   REFERRING  DIAG: Lumbosacral spondylosis with radiculopathy, Lumbar adjacent segment disease with spondylolisthesis   Rationale for Evaluation and Treatment: Rehabilitation  THERAPY DIAG:  Other low back pain  ONSET DATE: approximately 2 weeks ago  SUBJECTIVE:                                                                                                                                                                                           SUBJECTIVE STATEMENT: Patient reports that her left low back and hip has been bothering her for about 2 weeks. She initially thought that it was a  pulled muscle because she was exercising, but she was hurting so bad that she could hardly roll over in bed. She is doing better this week than she was last week because she could not even clean her house. She had never had any pain this severe before. She has begun to slowly return to her normal exercise videos, but she is still going slow and watching how she moves.   PERTINENT HISTORY:  Hypertension, diabetes type 2, hearing loss, arthritis, depression, and previous back surgery  PAIN:  Are you having pain? Yes: NPRS scale: Current: 0/10 Worst: 10/10 Pain location: left low back Pain description: catch  Aggravating factors: bending, twisting, rolling over Relieving factors: light activity  PRECAUTIONS: None  RED FLAGS: None   WEIGHT BEARING RESTRICTIONS: No  FALLS:  Has patient fallen in last 6 months? No  LIVING ENVIRONMENT: Lives with: lives with their spouse Lives in: House/apartment Stairs: Yes: External: 2-3 steps; none Has following equipment at home: None  OCCUPATION: retired  PLOF: Independent  PATIENT GOALS: reduced pain  NEXT MD VISIT: 12/14/23  OBJECTIVE:  Note: Objective measures were completed at Evaluation unless otherwise noted.  DIAGNOSTIC FINDINGS: 12/01/23 lumbar x-ray Exam results are not yet released  PATIENT SURVEYS:  Modified Oswestry:  MODIFIED OSWESTRY DISABILITY  SCALE  Date: 12/09/23 Score  Pain intensity 1 = The pain is bad, but I can manage without having to take (1) I can stand as long as I want but, it increases my pain. pain medication.  2. Personal care (washing, dressing, etc.) 0 =  I can take care of myself normally without causing increased pain.  3. Lifting 0 = I can lift heavy weights without increased pain.  4. Walking 1 = Pain prevents me from walking more than 1 mile.  5. Sitting 0 =  I can sit in any chair as long as I like.  6. Standing 3 =  Pain prevents me from standing more than 1/2 hour.  7. Sleeping 0 = Pain does not prevent me from sleeping well.  8. Social Life 2 = Pain prevents me from participating in more energetic activities (eg. sports, dancing).  9. Traveling 0 =  I can travel anywhere without increased pain.  10. Employment/ Homemaking 2 = I can perform most of my homemaking/job duties, but pain prevents me from performing more physically stressful activities (eg, lifting, vacuuming).  Total 9/50   Interpretation of scores: Score Category Description  0-20% Minimal Disability The patient can cope with most living activities. Usually no treatment is indicated apart from advice on lifting, sitting and exercise  21-40% Moderate Disability The patient experiences more pain and difficulty with sitting, lifting and standing. Travel and social life are more difficult and they may be disabled from work. Personal care, sexual activity and sleeping are not grossly affected, and the patient can usually be managed by conservative means  41-60% Severe Disability Pain remains the main problem in this group, but activities of daily living are affected. These patients require a detailed investigation  61-80% Crippled Back pain impinges on all aspects of the patient's life. Positive intervention is required  81-100% Bed-bound  These patients are either bed-bound or exaggerating their symptoms  Lisa Stephens Lisa Stephens, et al. Surgery  versus conservative management of stable thoracolumbar fracture: the PRESTO feasibility RCT. [place] ([place]): [employer]; 2021 Nov. [facility] Technology Assessment, No. 25.62.) Appendix 3, Oswestry Disability Index category descriptors. Available from: FindJewelers.cz  Minimally Clinically Important Difference (MCID) =  12.8%  COGNITION: Overall cognitive status: Within functional limits for tasks assessed     SENSATION: Patient reports no numbness or tingling  POSTURE: No Significant postural limitations  PALPATION: TTP: left quadratus lumborum   JOINT MOBILITY:  L1-3: WFL and nonpainful   L3-S1: WFL and tender   LUMBAR ROM:   AROM eval  Flexion 54; slight pain  Extension 12; slight pain  Right lateral flexion 50% limited  Left lateral flexion 75%  limited; familiar pain  Right rotation 25% limited  Left rotation 25% limited; slight pain   (Blank rows = not tested)  LOWER EXTREMITY ROM: WFL for activities assessed  LOWER EXTREMITY MMT:    MMT Right eval Left eval  Hip flexion 4+/5 4/5  Hip extension    Hip abduction    Hip adduction    Hip internal rotation    Hip external rotation    Knee flexion 4+/5 4+/5  Knee extension 4+/5 4/5  Ankle dorsiflexion 4+/5 4+/5  Ankle plantarflexion    Ankle inversion    Ankle eversion     (Blank rows = not tested)  FUNCTIONAL TESTS:  Bed mobility: slow and cautious movement  GAIT: Assistive device utilized: None Level of assistance: Complete Independence Comments: no significant gait deviations  TREATMENT DATE:                                                                                                                                  PATIENT EDUCATION:  Education details: POC, prognosis, objective findings, and goals for physical therapy Person educated: Patient Education method: Explanation Education comprehension: verbalized understanding  HOME EXERCISE  PROGRAM:   ASSESSMENT:  CLINICAL IMPRESSION: Patient is a 68 y.o. female who was seen today for physical therapy evaluation and treatment for acute left sided low back pain.  She presented with low pain severity and irritability with left lumbar sidebending reproducing her familiar symptoms.  Recommend she continue with skilled physical therapy to address her impairments to return to her prior level of function.  OBJECTIVE IMPAIRMENTS: decreased activity tolerance, decreased ROM, decreased strength, impaired tone, and pain.   ACTIVITY LIMITATIONS: bending and bed mobility  PARTICIPATION LIMITATIONS: cleaning, laundry, and community activity  PERSONAL FACTORS: Past/current experiences and 3+ comorbidities: Hypertension, diabetes type 2, hearing loss, arthritis, depression, and previous back surgery are also affecting patient's functional outcome.   REHAB POTENTIAL: Good  CLINICAL DECISION MAKING: Stable/uncomplicated  EVALUATION COMPLEXITY: Low   GOALS: Goals reviewed with patient? Yes  LONG TERM GOALS: Target date: 01/06/24  Patient will be independent with her HEP. Baseline:  Goal status: INITIAL  2.  Patient will be able to complete her household activities without her familiar symptoms exceeding 5/10. Baseline:  Goal status: INITIAL  3.  Patient will improve her ODI score to 5% disability or less for improved perceived function with her daily activities. Baseline:  Goal status: INITIAL  4.  Patient will be able to  transfer from supine to sitting without reproducing her familiar symptoms for improved mobility getting out of bed. Baseline:  Goal status: INITIAL  PLAN:  PT FREQUENCY: 1-2x/week  PT DURATION: 4 weeks  PLANNED INTERVENTIONS: 97164- PT Re-evaluation, 97750- Physical Performance Testing, 97110-Therapeutic exercises, 97530- Therapeutic activity, [id number]- Neuromuscular re-education, 97535- Self Care, 02859- Manual therapy, [id number]- Electrical stimulation  (unattended), 97035- Ultrasound, 02987- Traction (mechanical), 20560 (1-2 muscles), 20561 (3+ muscles)- Dry Needling, Patient/Family education, Balance training, Joint mobilization, Spinal mobilization, Cryotherapy, and Moist heat.  PLAN FOR NEXT SESSION: NuStep, manual therapy, lumbar stabilization, lower extremity strengthening, and modalities as needed   Lacinda JAYSON Fass, PT 12/09/2023, 4:53 PM

## 2023-12-11 ENCOUNTER — Other Ambulatory Visit

## 2023-12-11 DIAGNOSIS — E1159 Type 2 diabetes mellitus with other circulatory complications: Secondary | ICD-10-CM | POA: Diagnosis not present

## 2023-12-11 DIAGNOSIS — E785 Hyperlipidemia, unspecified: Secondary | ICD-10-CM | POA: Diagnosis not present

## 2023-12-11 DIAGNOSIS — E559 Vitamin D deficiency, unspecified: Secondary | ICD-10-CM | POA: Diagnosis not present

## 2023-12-11 DIAGNOSIS — E1169 Type 2 diabetes mellitus with other specified complication: Secondary | ICD-10-CM

## 2023-12-11 DIAGNOSIS — E039 Hypothyroidism, unspecified: Secondary | ICD-10-CM | POA: Diagnosis not present

## 2023-12-11 DIAGNOSIS — E669 Obesity, unspecified: Secondary | ICD-10-CM | POA: Diagnosis not present

## 2023-12-11 DIAGNOSIS — I152 Hypertension secondary to endocrine disorders: Secondary | ICD-10-CM | POA: Diagnosis not present

## 2023-12-11 LAB — BAYER DCA HB A1C WAIVED: HB A1C (BAYER DCA - WAIVED): 6.1 % — ABNORMAL HIGH (ref 4.8–5.6)

## 2023-12-11 LAB — LIPID PANEL

## 2023-12-12 LAB — CBC WITH DIFFERENTIAL/PLATELET
Basophils Absolute: 0.1 x10E3/uL (ref 0.0–0.2)
Basos: 1 %
EOS (ABSOLUTE): 0.3 x10E3/uL (ref 0.0–0.4)
Eos: 3 %
Hematocrit: 40.4 % (ref 34.0–46.6)
Hemoglobin: 13.2 g/dL (ref 11.1–15.9)
Immature Grans (Abs): 0 x10E3/uL (ref 0.0–0.1)
Immature Granulocytes: 0 %
Lymphocytes Absolute: 4.1 x10E3/uL — ABNORMAL HIGH (ref 0.7–3.1)
Lymphs: 37 %
MCH: 29.3 pg (ref 26.6–33.0)
MCHC: 32.7 g/dL (ref 31.5–35.7)
MCV: 90 fL (ref 79–97)
Monocytes Absolute: 0.9 x10E3/uL (ref 0.1–0.9)
Monocytes: 8 %
Neutrophils Absolute: 5.5 x10E3/uL (ref 1.4–7.0)
Neutrophils: 51 %
Platelets: 335 x10E3/uL (ref 150–450)
RBC: 4.5 x10E6/uL (ref 3.77–5.28)
RDW: 12.7 % (ref 11.7–15.4)
WBC: 10.9 x10E3/uL — ABNORMAL HIGH (ref 3.4–10.8)

## 2023-12-12 LAB — CMP14+EGFR
ALT: 53 IU/L — ABNORMAL HIGH (ref 0–32)
AST: 29 IU/L (ref 0–40)
Albumin: 4 g/dL (ref 3.9–4.9)
Alkaline Phosphatase: 136 IU/L — ABNORMAL HIGH (ref 44–121)
BUN/Creatinine Ratio: 18 (ref 12–28)
BUN: 11 mg/dL (ref 8–27)
Bilirubin Total: 0.5 mg/dL (ref 0.0–1.2)
CO2: 21 mmol/L (ref 20–29)
Calcium: 9.1 mg/dL (ref 8.7–10.3)
Chloride: 108 mmol/L — ABNORMAL HIGH (ref 96–106)
Creatinine, Ser: 0.6 mg/dL (ref 0.57–1.00)
Globulin, Total: 2.9 g/dL (ref 1.5–4.5)
Glucose: 114 mg/dL — ABNORMAL HIGH (ref 70–99)
Potassium: 4.1 mmol/L (ref 3.5–5.2)
Sodium: 133 mmol/L — ABNORMAL LOW (ref 134–144)
Total Protein: 6.9 g/dL (ref 6.0–8.5)
eGFR: 98 mL/min/1.73 (ref >59)

## 2023-12-12 LAB — LIPID PANEL
Chol/HDL Ratio: 3 ratio (ref 0.0–4.4)
Cholesterol, Total: 192 mg/dL (ref 100–199)
HDL: 64 mg/dL (ref >39)
LDL Chol Calc (NIH): 114 mg/dL — ABNORMAL HIGH (ref 0–99)
Triglycerides: 78 mg/dL (ref 0–149)
VLDL Cholesterol Cal: 14 mg/dL (ref 5–40)

## 2023-12-12 LAB — TSH+FREE T4
Free T4: 1.41 ng/dL (ref 0.82–1.77)
TSH: 3.3 u[IU]/mL (ref 0.450–4.500)

## 2023-12-12 LAB — VITAMIN D 25 HYDROXY (VIT D DEFICIENCY, FRACTURES): Vit D, 25-Hydroxy: 40.3 ng/mL (ref 30.0–100.0)

## 2023-12-14 ENCOUNTER — Ambulatory Visit: Payer: Self-pay | Admitting: Family Medicine

## 2023-12-14 ENCOUNTER — Ambulatory Visit (INDEPENDENT_AMBULATORY_CARE_PROVIDER_SITE_OTHER): Payer: BC Managed Care – PPO | Admitting: Family Medicine

## 2023-12-14 ENCOUNTER — Encounter: Payer: Self-pay | Admitting: Family Medicine

## 2023-12-14 VITALS — BP 149/79 | HR 57 | Temp 97.2°F | Ht 64.0 in | Wt 194.4 lb

## 2023-12-14 DIAGNOSIS — M4727 Other spondylosis with radiculopathy, lumbosacral region: Secondary | ICD-10-CM | POA: Diagnosis not present

## 2023-12-14 DIAGNOSIS — E6609 Other obesity due to excess calories: Secondary | ICD-10-CM

## 2023-12-14 DIAGNOSIS — Z7984 Long term (current) use of oral hypoglycemic drugs: Secondary | ICD-10-CM | POA: Diagnosis not present

## 2023-12-14 DIAGNOSIS — Z6833 Body mass index (BMI) 33.0-33.9, adult: Secondary | ICD-10-CM

## 2023-12-14 DIAGNOSIS — Z0001 Encounter for general adult medical examination with abnormal findings: Secondary | ICD-10-CM

## 2023-12-14 DIAGNOSIS — Z8249 Family history of ischemic heart disease and other diseases of the circulatory system: Secondary | ICD-10-CM

## 2023-12-14 DIAGNOSIS — E66811 Obesity, class 1: Secondary | ICD-10-CM

## 2023-12-14 DIAGNOSIS — E1159 Type 2 diabetes mellitus with other circulatory complications: Secondary | ICD-10-CM | POA: Diagnosis not present

## 2023-12-14 DIAGNOSIS — E1169 Type 2 diabetes mellitus with other specified complication: Secondary | ICD-10-CM

## 2023-12-14 DIAGNOSIS — E119 Type 2 diabetes mellitus without complications: Secondary | ICD-10-CM

## 2023-12-14 DIAGNOSIS — Z23 Encounter for immunization: Secondary | ICD-10-CM | POA: Diagnosis not present

## 2023-12-14 DIAGNOSIS — E785 Hyperlipidemia, unspecified: Secondary | ICD-10-CM | POA: Diagnosis not present

## 2023-12-14 DIAGNOSIS — Z0000 Encounter for general adult medical examination without abnormal findings: Secondary | ICD-10-CM

## 2023-12-14 DIAGNOSIS — I152 Hypertension secondary to endocrine disorders: Secondary | ICD-10-CM

## 2023-12-14 MED ORDER — PITAVASTATIN MAGNESIUM 4 MG PO TABS: 1.0000 | ORAL_TABLET | Freq: Every day | ORAL | 3 refills | Status: DC

## 2023-12-14 NOTE — Patient Instructions (Signed)
 STOP atorvastatin  START Pitivastatin  Diabetes: Healthy Eating for Adults When you have diabetes, also called diabetes mellitus, it's important to have healthy eating habits. Your blood sugar (glucose) levels are greatly affected by what you eat and drink. You need to eat healthy foods in the right amounts, at about the same times each day. Doing this can help you: Manage your blood sugar. Lower your risk of heart disease. Improve your blood pressure. Reach or stay at a healthy weight. What can affect my meal plan? Every person with diabetes is different. And each person has different needs for a meal plan. Your health care provider may suggest that you work with an expert in healthy eating called a dietitian. They can help you make a meal plan that's best for you. How do carbohydrates affect me? Carbohydrates, also called carbs, affect your blood sugar level more than any other type of food. Eating carbs raises the amount of sugar in your blood. It's important to know how many carbs you can safely have in each meal. This is different for every person. Your dietitian can help you calculate how many carbs you should have at each meal and for each snack. How does alcohol affect me? Alcohol can cause a decrease in blood sugar (hypoglycemia), especially if you use insulin or take certain diabetes medicines by mouth. Hypoglycemia can be a life-threatening condition. Symptoms of hypoglycemia are similar to those of having too much alcohol. They include confusion, being sleepy, and feeling dizzy. Do not drink alcohol if: Your provider tells you not to drink. You're pregnant, may be pregnant, or plan to become pregnant. What are tips for following this plan? Reading food labels Start by checking the serving size on the Nutrition Facts label of packaged foods and drinks. The number of calories and the amount of carbs, fats, and other nutrients listed on the label are based on one serving of the item.  Many items contain more than one serving per package. Check the total grams (g) of carbs in one serving. Check the number of grams of saturated fats and trans fats in one serving. Choose foods that have a low amount or none of these fats. Check the number of milligrams (mg) of salt (sodium) in one serving. Most people should limit their total sodium intake to less than 2,300 mg per day. Always check the nutrition information of foods labeled as low-fat or nonfat. These foods may be higher in added sugar or refined carbs and should be avoided. Talk to your dietitian to identify your daily goals for nutrients listed on the label. Shopping Avoid buying canned, pre-made, or processed foods. These foods tend to be high in fat, sodium, and added sugar. Shop around the outside edge of the grocery store. This is where you'll most often find fresh fruits and vegetables, bulk grains, fresh meats, and fresh dairy products. Cooking Use low-heat cooking methods, such as baking, instead of high-heat methods like deep frying. Cook using healthy oils, such as olive, canola, or sunflower oil. Avoid cooking with butter, cream, or high-fat meats. Meal planning  Eat meals and snacks regularly. Try to eat them at the same times every day. Avoid going too long without eating. Eat foods that are high in fiber, such as fresh fruits, vegetables, beans, and whole grains. Eat 4-6 oz (112-168 g) of lean protein each day, such as lean meat, chicken, fish, eggs, or tofu. One ounce (oz) (28 g) of lean protein is equal to: 1 oz (28 g)  of meat, chicken, or fish. 1 egg.  cup (62 g) of tofu. Eat some foods each day that contain healthy fats, such as avocado, nuts, seeds, and fish. What foods should I eat? Fruits Berries. Apples. Oranges. Peaches. Apricots. Plums. Grapes. Mangoes. Papayas. Pomegranates. Kiwi. Cherries. Vegetables Leafy greens, including lettuce, spinach, kale, chard, collard greens, mustard greens, and  cabbage. Beets. Cauliflower. Broccoli. Carrots. Green beans. Tomatoes. Peppers. Onions. Cucumbers. Brussels sprouts. Grains Whole grains, such as whole-wheat or whole-grain bread, crackers, tortillas, cereal, and pasta. Unsweetened oatmeal. Quinoa. Brown or wild rice. Meats and other proteins Seafood. Poultry without skin. Lean cuts of poultry and beef. Tofu. Nuts. Seeds. Dairy Low-fat or fat-free dairy products such as milk, yogurt, and cheese. The items listed above may not be all the foods and drinks you can have. Talk with a dietitian to learn more. What foods should I avoid? Fruits Fruits canned with syrup. Vegetables Canned vegetables. Frozen vegetables with butter or cream sauce. Grains Refined white flour and flour products such as bread, pasta, snack foods, and cereals. Avoid all processed foods. Meats and other proteins Fatty cuts of meat. Poultry with skin. Breaded or fried meats. Processed meat. Avoid saturated fats. Dairy Full-fat yogurt, cheese, or milk. Beverages Sweetened drinks, such as soda or iced tea. The items listed above may not be all the foods and drinks you should avoid. Talk with a dietitian to learn more. Where to find more information: To learn more, go to: Academy of Nutrition and Dietetics at DeathPrevention.it. Click Search and type diabetes. Find the link you need. Centers for Disease Control and Prevention at TonerPromos.no. Click Search and type diabetes. Find the link you need. American Diabetes Association: diabetes.org/food-nutrition [employer] of Diabetes and Digestive and Kidney Diseases: StageSync.si This information is not intended to replace advice given to you by your health care provider. Make sure you discuss any questions you have with your health care provider. Document Revised: 03/12/2023 Document Reviewed: 03/12/2023 Elsevier Patient Education  2025 [employer].

## 2023-12-14 NOTE — Progress Notes (Signed)
 Lisa Stephens is a 68 y.o. female presents to office today for annual physical exam examination.    Discussed the use of AI scribe software for clinical note transcription with the patient, who gave verbal consent to proceed.  History of Present Illness   Lisa Stephens is a 68 year old female who presents for follow-up on back pain and cholesterol management.  Her back pain has improved by about 98% since starting therapy and gradually resuming exercise. She has increased her walking from two laps to six laps at the track. There is some residual weakness in her left leg and hip. She has a history of back surgery with fusion of L2 and L3. She has not needed to contact her surgeon as her symptoms have improved.  Recent lab work showed an increase in A1c to 6.1. Blood sugar was noted to be 114, and her white blood cell count was elevated. Her cholesterol levels have increased, with LDL rising to 114. She is currently on atorvastatin  a few times a week due to muscle aches. She has tried Crestor in the past but experienced significant side effects. She has a family history of cardiovascular issues, including a sister who had a massive heart attack last year. She is concerned about her cholesterol levels given this family history.  No major changes in vision, hearing, or swallowing. No chest pain, shortness of breath, breast concerns, nausea, vomiting, abdominal pain, constipation, diarrhea, vaginal discharge, urinary issues, or new skin concerns. Bowel movements are irregular but without associated pain. She drinks plenty of water and eats salads for fiber but notes she may not be getting enough fiber overall.  She is currently taking amlodipine  for blood pressure and has received a 90-day supply of her medications, excluding Rybelsus , as she is seeking patient assistance for this medication, with hopes to transition to Ozempic  if affordable.      Occupation: retired, Substance use: none Health  Maintenance Due  Topic Date Due   Medicare Annual Wellness (AWV)  Never done   OPHTHALMOLOGY EXAM  07/07/2023   Refills needed today: none  Immunization History  Administered Date(s) Administered   Fluad Quad(high Dose 65+) 01/20/2022   Fluad Trivalent(High Dose 65+) 02/02/2023   INFLUENZA, HIGH DOSE SEASONAL PF 12/14/2023   Influenza Inj Mdck Quad Pf 01/16/2021   Influenza Inj Mdck Quad With Preservative 12/29/2017   Influenza Whole 01/23/2010, 01/27/2012, 01/25/2013, 02/02/2014, 01/18/2015   Influenza,inj,Quad PF,6+ Mos 12/26/2016   Influenza-Unspecified 01/23/2010, 01/27/2012, 01/25/2013, 02/02/2014, 01/06/2015, 01/18/2015, 01/15/2016, 12/29/2017, 01/19/2019, 01/16/2020   PFIZER(Purple Top)SARS-COV-2 Vaccination 06/21/2019, 07/12/2019, 01/17/2020   PNEUMOCOCCAL CONJUGATE-20 10/17/2022   Pneumococcal Polysaccharide-23 04/07/2013, 10/16/2014   Tdap 01/25/2013, 04/24/2023   Zoster Recombinant(Shingrix ) 10/17/2022, 04/24/2023   Zoster, Live 01/15/2016   Past Medical History:  Diagnosis Date   Arthritis    Blood transfusion without reported diagnosis    Cataract    small beginning   Depression    Diabetes mellitus without complication (HCC)    Fracture of distal end of right radius 08/21/2017   Hyperlipidemia    Hypertension    Hypothyroidism    Social History   Socioeconomic History   Marital status: Single    Spouse name: charles   Number of children: Not on file   Years of education: Not on file   Highest education level: Not on file  Occupational History   Not on file  Tobacco Use   Smoking status: Never   Smokeless tobacco: Never  Vaping Use   Vaping  status: Never Used  Substance and Sexual Activity   Alcohol use: No   Drug use: No   Sexual activity: Not on file  Other Topics Concern   Not on file  Social History Narrative   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)    Social Drivers of Corporate investment banker Strain: Low Risk  (12/06/2021)    Overall Financial Resource Strain (CARDIA)    Difficulty of Paying Living Expenses: Not hard at all  Food Insecurity: No Food Insecurity (12/06/2021)   Hunger Vital Sign    Worried About Running Out of Food in the Last Year: Never true    Ran Out of Food in the Last Year: Never true  Transportation Needs: No Transportation Needs (12/06/2021)   PRAPARE - Administrator, Civil Service (Medical): No    Lack of Transportation (Non-Medical): No  Physical Activity: Not on file  Stress: No Stress Concern Present (12/06/2021)   [employer] of Occupational Health - Occupational Stress Questionnaire    Feeling of Stress : Not at all  Social Connections: Socially Integrated (12/06/2021)   Social Connection and Isolation Panel    Frequency of Communication with Friends and Family: More than three times a week    Frequency of Social Gatherings with Friends and Family: More than three times a week    Attends Religious Services: 1 to 4 times per year    Active Member of [employer] or Organizations: Yes    Attends Banker Meetings: 1 to 4 times per year    Marital Status: Married  Catering manager Violence: Not At Risk (12/06/2021)   Humiliation, Afraid, Rape, and Kick questionnaire    Fear of Current or Ex-Partner: No    Emotionally Abused: No    Physically Abused: No    Sexually Abused: No   Past Surgical History:  Procedure Laterality Date   ABDOMINAL HYSTERECTOMY     BACK SURGERY  09/2015   revision   MAXIMUM ACCESS (MAS)POSTERIOR LUMBAR INTERBODY FUSION (PLIF) 2 LEVEL N/A 07/02/2015   Procedure: L3-4 L4-5 Maximum access posterior lumbar interbody fusion;  Surgeon: Morene Hicks Ditty, MD;  Location: [facility];  Service: Neurosurgery;  Laterality: N/A;  L3-4 L4-5 Maximum access posterior lumbar interbody fusion   TUBAL LIGATION     Family History  Problem Relation Age of Onset   Diabetes Mother    Hypertension Mother    Diabetes Sister    Hypertension Sister     Colon polyps Sister    Diabetes Brother    Early death Son    Diabetes Maternal Aunt    Colon cancer Maternal Aunt    Diabetes Maternal Uncle    Esophageal cancer Neg Hx    Rectal cancer Neg Hx    Stomach cancer Neg Hx     Current Outpatient Medications:    acetaminophen  (TYLENOL ) 500 MG tablet, Take 1 tablet (500 mg total) by mouth every 6 (six) hours as needed., Disp: 30 tablet, Rfl: 0   amLODipine -olmesartan  (AZOR ) 10-40 MG tablet, Take 1 tablet by mouth daily., Disp: 100 tablet, Rfl: 0   aspirin  EC 81 MG tablet, Take 81 mg by mouth daily., Disp: , Rfl:    Blood Glucose Monitoring Suppl (CONTOUR NEXT MONITOR) w/Device KIT, , Disp: , Rfl:    Cholecalciferol  (VITAMIN D -3) 125 MCG (5000 UT) TABS, Take 5,000 Units by mouth daily., Disp: , Rfl:    Coenzyme Q10 (CO Q 10 PO), Take 500 mg  by mouth daily., Disp: , Rfl:    CONTOUR NEXT TEST test strip, , Disp: , Rfl:    glucose blood (CONTOUR NEXT TEST) test strip, as directed per package instructions In Vitro twice a day for 90 days, Disp: , Rfl:    Lancets (ONETOUCH ULTRASOFT) lancets, 1 Device by Misc.(Non-Drug; Combo Route) route daily., Disp: , Rfl:    levothyroxine  (SYNTHROID ) 100 MCG tablet, TAKE 1 TABLET BY MOUTH  DAILY BEFORE BREAKFAST, Disp: 100 tablet, Rfl: 1   methocarbamol  (ROBAXIN ) 500 MG tablet, Take 1 tablet (500 mg total) by mouth every 8 (eight) hours as needed for muscle spasms., Disp: 60 tablet, Rfl: 1   Multiple Vitamins-Minerals (MULTIVITAMIN WITH MINERALS) tablet, Take 1 tablet by mouth daily., Disp: , Rfl:    Omega-3 Fatty Acids (FISH OIL) 1000 MG CAPS, Take 1,000 mg by mouth daily., Disp: , Rfl:    Pitavastatin  Magnesium  4 MG TABS, Take 1 tablet (4 mg total) by mouth daily. To replace atorvastatin , Disp: 90 tablet, Rfl: 3   Semaglutide  (RYBELSUS ) 14 MG TABS, Take 1 tablet (14 mg total) by mouth daily., Disp: 100 tablet, Rfl: 3  Allergies  Allergen Reactions   Voltaren [Diclofenac Sodium] Shortness Of Breath and Other  (See Comments)    Wheezing, coughing, runny nose     ROS: Review of Systems Pertinent items noted in HPI and remainder of comprehensive ROS otherwise negative.    Physical exam BP (!) 149/79   Pulse (!) 57   Temp (!) 97.2 F (36.2 C)   Ht 5' 4 (1.626 m)   Wt 194 lb 6 oz (88.2 kg)   SpO2 97%   BMI 33.36 kg/m  General appearance: alert, cooperative, appears stated age, no distress, and moderately obese Head: Normocephalic, without obvious abnormality, atraumatic Eyes: negative findings: lids and lashes normal, conjunctivae and sclerae normal, corneas clear, and pupils equal, round, reactive to light and accomodation Ears: normal TM's and external ear canals both ears Nose: Nares normal. Septum midline. Mucosa normal. No drainage or sinus tenderness. Throat: lips, mucosa, and tongue normal; teeth and gums normal Neck: no adenopathy, no carotid bruit, supple, symmetrical, trachea midline, and thyroid  not enlarged, symmetric, no tenderness/mass/nodules Back: symmetric, no curvature. ROM normal. No CVA tenderness. Lungs: clear to auscultation bilaterally Heart: regular rate and rhythm, S1, S2 normal, no murmur, click, rub or gallop Abdomen: soft, non-tender; bowel sounds normal; no masses,  no organomegaly and obese Extremities: extremities normal, atraumatic, no cyanosis or edema Pulses: 2+ and symmetric Skin: Skin color, texture, turgor normal. No rashes or lesions Lymph nodes: Cervical, supraclavicular, and axillary nodes normal. Neurologic: Grossly normal  Diabetic Foot Exam - Simple   Simple Foot Form Diabetic Foot exam was performed with the following findings: Yes 12/14/2023  8:20 AM  Visual Inspection See comments: Yes Sensation Testing Intact to touch and monofilament testing bilaterally: Yes Pulse Check Posterior Tibialis and Dorsalis pulse intact bilaterally: Yes Comments Onychomycosis of nails of 2nd digits bilaterally.       12/14/2023    8:04 AM 04/24/2023    12:57 PM 10/17/2022   11:28 AM  Depression screen PHQ 2/9  Decreased Interest 0 0 0  Down, Depressed, Hopeless 0 0 0  PHQ - 2 Score 0 0 0  Altered sleeping 0 0   Tired, decreased energy 0 0   Change in appetite 0 0   Feeling bad or failure about yourself  0 0   Trouble concentrating 0 0   Moving slowly or fidgety/restless  0 0   Suicidal thoughts 0 0   PHQ-9 Score 0 0   Difficult doing work/chores Not difficult at all Not difficult at all       12/14/2023    8:05 AM 04/24/2023   12:57 PM 05/15/2022    2:16 PM 04/04/2022   10:58 AM  GAD 7 : Generalized Anxiety Score  Nervous, Anxious, on Edge 1 0 1 1  Control/stop worrying 1 0 1 1  Worry too much - different things 0 0 1 1  Trouble relaxing 0 0 0 0  Restless 0 0 0 0  Easily annoyed or irritable 0 0 0 0  Afraid - awful might happen 1 0 0 0  Total GAD 7 Score 3 0 3 3  Anxiety Difficulty Somewhat difficult Not difficult at all  Not difficult at all   The 10-year ASCVD risk score (Arnett DK, et al., 2019) is: 29.1%   Values used to calculate the score:     Age: 63 years     Clincally relevant sex: Female     Is Non-Hispanic African American: Yes     Diabetic: Yes     Tobacco smoker: No     Systolic Blood Pressure: 149 mmHg     Is BP treated: Yes     HDL Cholesterol: 64 mg/dL     Total Cholesterol: 192 mg/dL Results for orders placed or performed in visit on 12/11/23 (from the past 72 hours)  Bayer DCA Hb A1c Waived     Status: Abnormal   Collection Time: 12/11/23  8:28 AM  Result Value Ref Range   HB A1C (BAYER DCA - WAIVED) 6.1 (H) 4.8 - 5.6 %    Comment:          Prediabetes: 5.7 - 6.4          Diabetes: >6.4          Glycemic control for adults with diabetes: <7.0   VITAMIN D  25 Hydroxy (Vit-D Deficiency, Fractures)     Status: None   Collection Time: 12/11/23  8:30 AM  Result Value Ref Range   Vit D, 25-Hydroxy 40.3 30.0 - 100.0 ng/mL    Comment: Vitamin D  deficiency has been defined by the Institute of Medicine and  an Endocrine Society practice guideline as a level of serum 25-OH vitamin D  less than 20 ng/mL (1,2). The Endocrine Society went on to further define vitamin D  insufficiency as a level between 21 and 29 ng/mL (2). 1. IOM (Institute of Medicine). 2010. Dietary reference    intakes for calcium  and D. [place]: The    [employer]. 2. Holick MF, [place], Bischoff-Ferrari HA, et al.    Evaluation, treatment, and prevention of vitamin D     deficiency: an Endocrine Society clinical practice    guideline. JCEM. 2011 Jul; 96(7):1911-30.   TSH + free T4     Status: None   Collection Time: 12/11/23  8:30 AM  Result Value Ref Range   TSH 3.300 0.450 - 4.500 uIU/mL   Free T4 1.41 0.82 - 1.77 ng/dL  CBC with Differential/Platelet     Status: Abnormal   Collection Time: 12/11/23  8:30 AM  Result Value Ref Range   WBC 10.9 (H) 3.4 - 10.8 x10E3/uL   RBC 4.50 3.77 - 5.28 x10E6/uL   Hemoglobin 13.2 11.1 - 15.9 g/dL   Hematocrit 59.5 65.9 - 46.6 %   MCV 90 79 - 97 fL   MCH 29.3 26.6 -  33.0 pg   MCHC 32.7 31.5 - 35.7 g/dL   RDW 87.2 88.2 - 84.5 %   Platelets 335 150 - 450 x10E3/uL   Neutrophils 51 Not Estab. %   Lymphs 37 Not Estab. %   Monocytes 8 Not Estab. %   Eos 3 Not Estab. %   Basos 1 Not Estab. %   Neutrophils Absolute 5.5 1.4 - 7.0 x10E3/uL   Lymphocytes Absolute 4.1 (H) 0.7 - 3.1 x10E3/uL   Monocytes Absolute 0.9 0.1 - 0.9 x10E3/uL   EOS (ABSOLUTE) 0.3 0.0 - 0.4 x10E3/uL   Basophils Absolute 0.1 0.0 - 0.2 x10E3/uL   Immature Granulocytes 0 Not Estab. %   Immature Grans (Abs) 0.0 0.0 - 0.1 x10E3/uL  Lipid panel     Status: Abnormal   Collection Time: 12/11/23  8:30 AM  Result Value Ref Range   Cholesterol, Total 192 100 - 199 mg/dL   Triglycerides 78 0 - 149 mg/dL   HDL 64 >60 mg/dL   VLDL Cholesterol Cal 14 5 - 40 mg/dL   LDL Chol Calc (NIH) 885 (H) 0 - 99 mg/dL   Chol/HDL Ratio 3.0 0.0 - 4.4 ratio    Comment:                                   T. Chol/HDL  Ratio                                             Men  Women                               1/2 Avg.Risk  3.4    3.3                                   Avg.Risk  5.0    4.4                                2X Avg.Risk  9.6    7.1                                3X Avg.Risk 23.4   11.0   CMP14+EGFR     Status: Abnormal   Collection Time: 12/11/23  8:30 AM  Result Value Ref Range   Glucose 114 (H) 70 - 99 mg/dL   BUN 11 8 - 27 mg/dL   Creatinine, Ser 9.39 0.57 - 1.00 mg/dL   eGFR 98 >40 fO/fpw/8.26   BUN/Creatinine Ratio 18 12 - 28   Sodium 133 (L) 134 - 144 mmol/L   Potassium 4.1 3.5 - 5.2 mmol/L   Chloride 108 (H) 96 - 106 mmol/L   CO2 21 20 - 29 mmol/L   Calcium  9.1 8.7 - 10.3 mg/dL   Total Protein 6.9 6.0 - 8.5 g/dL   Albumin  4.0 3.9 - 4.9 g/dL   Globulin, Total 2.9 1.5 - 4.5 g/dL   Bilirubin Total 0.5 0.0 - 1.2 mg/dL   Alkaline Phosphatase 136 (H) 44 - 121 IU/L    Comment: **Effective December 21, 2023 Alkaline Phosphatase**   reference interval will be changing to:              Age                Female          Female           0 -  5 days         47 - 127       47 - 127           6 - 10 days         29 - 242       29 - 242          11 - 20 days        109 - 357      109 - 357          21 - 30 days         94 - 494       94 - 494           1 -  2 months      149 - 539      149 - 539           3 -  6 months      131 - 452      131 - 452           7 - 11 months      117 - 401      117 - 401   12 months -  6 years       158 - 369      158 - 369           7 - 12 years       150 - 409      150 - 409               13 years       156 - 435       78 - 227               14 years       114 - 375       64 - 161               15 years        88 - 279       56 - 134               16 years        74 - 207       51 - 121               17 years        63 - 161       47 - 113          18 - 20 years        51 - 125       42 - 106          21 - 50 years         47 - 123       41 - 116           51 - 80 years        49 - 135  51 - 125              >80 years        48 - 129       48 - 129    AST 29 0 - 40 IU/L   ALT 53 (H) 0 - 32 IU/L   Assessment/ Plan: Lisa Stephens for annual physical exam.   Annual physical exam - Plan: Flu vaccine HIGH DOSE PF(Fluzone Trivalent)  Need for vaccination - Plan: Flu vaccine HIGH DOSE PF(Fluzone Trivalent)  Diabetes mellitus treated with oral medication (HCC) - Plan: Bayer DCA Hb A1c Waived  Hypertension associated with diabetes (HCC)  Hyperlipidemia associated with type 2 diabetes mellitus (HCC) - Plan: Pitavastatin  Magnesium  4 MG TABS, Lipid panel, Hepatic function panel  Family history of ischemic heart disease - Plan: Pitavastatin  Magnesium  4 MG TABS, Lipid panel, Hepatic function panel  Class 1 obesity due to excess calories with serious comorbidity and body mass index (BMI) of 33.0 to 33.9 in adult  Lumbosacral spondylosis with radiculopathy  Assessment and Plan    Adult Wellness Visit Routine wellness visit with no major changes in family history except for a sister's myocardial infarction last year. No new symptoms reported. - Administer flu shot today. - Perform foot exam. - Recheck blood pressure. - Schedule follow-up in three months for fasting cholesterol panel, liver function tests, and A1c. - Mammogram scheduled in two weeks.  Chronic low back pain with prior lumbar fusion and lumbar disc disease Chronic low back pain with prior L2-L3 lumbar fusion. Recent exacerbation likely due to disc space height loss and facet arthropathy. Symptoms improved with steroid treatment and physical therapy. No current need for surgical intervention unless symptoms worsen. - Continue physical therapy. - Consult with Dr. Lindalee if symptoms worsen.  Type 2 diabetes mellitus w/ HTN and HLD and obesity Stroke risk score at 26%, indicating high risk. Previous atorvastatin  therapy limited by muscle aches. Plan to switch to pitavastatin   due to better tolerance profile. Discussed potential for muscle aches with previous statins and benefits of pitavastatin , which is less likely to cause muscle aches due to different metabolism. Emphasized importance of lowering cholesterol due to family history of cardiovascular events.  A1c increase to 6.1%, likely due to recent steroid use. Blood glucose level was 114 mg/dL. No immediate concerns but will monitor. - Recheck A1c in three months. - Prescribe pitavastatin  4 mg daily. - Recheck fasting cholesterol panel in three months.  - Continue Azor  BP controlled upon recheck.      Counseled on healthy lifestyle choices, including diet (rich in fruits, vegetables and lean meats and low in salt and simple carbohydrates) and exercise (at least 30 minutes of moderate physical activity daily).  Patient to follow up 78m  Lisa Stephens M. Jolinda, DO

## 2023-12-15 ENCOUNTER — Ambulatory Visit: Admitting: Physical Therapy

## 2023-12-15 DIAGNOSIS — M4727 Other spondylosis with radiculopathy, lumbosacral region: Secondary | ICD-10-CM | POA: Diagnosis not present

## 2023-12-15 DIAGNOSIS — M5459 Other low back pain: Secondary | ICD-10-CM

## 2023-12-15 DIAGNOSIS — M51369 Other intervertebral disc degeneration, lumbar region without mention of lumbar back pain or lower extremity pain: Secondary | ICD-10-CM | POA: Diagnosis not present

## 2023-12-15 DIAGNOSIS — M5416 Radiculopathy, lumbar region: Secondary | ICD-10-CM | POA: Diagnosis not present

## 2023-12-15 DIAGNOSIS — M4316 Spondylolisthesis, lumbar region: Secondary | ICD-10-CM | POA: Diagnosis not present

## 2023-12-15 NOTE — Therapy (Signed)
 OUTPATIENT PHYSICAL THERAPY THORACOLUMBAR TREATMENT  Patient Name: Lisa Stephens MRN: [id number] DOB:04-13-1955, 68 y.o., female Today's Date: 12/15/2023  END OF SESSION:  PT End of Session - 12/15/23 0809     Visit Number 2    Number of Visits 8    Date for PT Re-Evaluation 02/05/24    PT Start Time 0800    PT Stop Time 0852    PT Time Calculation (min) 52 min    Activity Tolerance Patient tolerated treatment well    Behavior During Therapy [facility] for tasks assessed/performed          Past Medical History:  Diagnosis Date   Arthritis    Blood transfusion without reported diagnosis    Cataract    small beginning   Depression    Diabetes mellitus without complication (HCC)    Fracture of distal end of right radius 08/21/2017   Hyperlipidemia    Hypertension    Hypothyroidism    Past Surgical History:  Procedure Laterality Date   ABDOMINAL HYSTERECTOMY     BACK SURGERY  09/2015   revision   MAXIMUM ACCESS (MAS)POSTERIOR LUMBAR INTERBODY FUSION (PLIF) 2 LEVEL N/A 07/02/2015   Procedure: L3-4 L4-5 Maximum access posterior lumbar interbody fusion;  Surgeon: Morene Hicks Ditty, MD;  Location: [facility];  Service: Neurosurgery;  Laterality: N/A;  L3-4 L4-5 Maximum access posterior lumbar interbody fusion   TUBAL LIGATION     Patient Active Problem List   Diagnosis Date Noted   Lumbar adjacent segment disease with spondylolisthesis 08/13/2022   Rotator cuff tendinitis, right 04/04/2020   Foot drop, right 09/26/2015   Stress incontinence in female 08/23/2015   Hearing loss 08/23/2015   Lumbosacral spondylosis with radiculopathy 07/02/2015   Hypothyroidism 05/28/2015   Vitamin D  deficiency 05/28/2015   Diabetes mellitus treated with oral medication (HCC) 12/08/2013   Hypertension associated with diabetes (HCC) 12/08/2013   Obesity 12/08/2013   Hyperlipidemia associated with type 2 diabetes mellitus (HCC) 09/07/2013   REFERRING PROVIDER: Jolinda Norene HERO, DO    REFERRING DIAG: Lumbosacral spondylosis with radiculopathy, Lumbar adjacent segment disease with spondylolisthesis   Rationale for Evaluation and Treatment: Rehabilitation  THERAPY DIAG:  Other low back pain  Radiculopathy, lumbar region  ONSET DATE: approximately 2 weeks ago  SUBJECTIVE:                                                                                                                                                                                           SUBJECTIVE STATEMENT: Patient states her pain is low today.  About a 2.  PERTINENT HISTORY:  Hypertension, diabetes type 2, hearing loss,  arthritis, depression, and previous back surgery  PAIN:  Are you having pain? Yes: NPRS scale: Current: 2/10. Pain location: left low back Pain description: catch  Aggravating factors: bending, twisting, rolling over Relieving factors: light activity  PRECAUTIONS: None  RED FLAGS: None   WEIGHT BEARING RESTRICTIONS: No  FALLS:  Has patient fallen in last 6 months? No  LIVING ENVIRONMENT: Lives with: lives with their spouse Lives in: House/apartment Stairs: Yes: External: 2-3 steps; none Has following equipment at home: None  OCCUPATION: retired  PLOF: Independent  PATIENT GOALS: reduced pain  NEXT MD VISIT: 12/14/23  OBJECTIVE:  Note: Objective measures were completed at Evaluation unless otherwise noted.  DIAGNOSTIC FINDINGS: 12/01/23 lumbar x-ray Exam results are not yet released  PATIENT SURVEYS:  Modified Oswestry:  MODIFIED OSWESTRY DISABILITY SCALE  Date: 12/09/23 Score  Pain intensity 1 = The pain is bad, but I can manage without having to take (1) I can stand as long as I want but, it increases my pain. pain medication.  2. Personal care (washing, dressing, etc.) 0 =  I can take care of myself normally without causing increased pain.  3. Lifting 0 = I can lift heavy weights without increased pain.  4. Walking 1 = Pain prevents me from walking  more than 1 mile.  5. Sitting 0 =  I can sit in any chair as long as I like.  6. Standing 3 =  Pain prevents me from standing more than 1/2 hour.  7. Sleeping 0 = Pain does not prevent me from sleeping well.  8. Social Life 2 = Pain prevents me from participating in more energetic activities (eg. sports, dancing).  9. Traveling 0 =  I can travel anywhere without increased pain.  10. Employment/ Homemaking 2 = I can perform most of my homemaking/job duties, but pain prevents me from performing more physically stressful activities (eg, lifting, vacuuming).  Total 9/50   Interpretation of scores: Score Category Description  0-20% Minimal Disability The patient can cope with most living activities. Usually no treatment is indicated apart from advice on lifting, sitting and exercise  21-40% Moderate Disability The patient experiences more pain and difficulty with sitting, lifting and standing. Travel and social life are more difficult and they may be disabled from work. Personal care, sexual activity and sleeping are not grossly affected, and the patient can usually be managed by conservative means  41-60% Severe Disability Pain remains the main problem in this group, but activities of daily living are affected. These patients require a detailed investigation  61-80% Crippled Back pain impinges on all aspects of the patient's life. Positive intervention is required  81-100% Bed-bound  These patients are either bed-bound or exaggerating their symptoms  Bluford FORBES [name], et al. Surgery versus conservative management of stable thoracolumbar fracture: the PRESTO feasibility RCT. [place] ([place]): [employer]; 2021 Nov. [facility] Technology Assessment, No. 25.62.) Appendix 3, Oswestry Disability Index category descriptors. Available from: [employer]Jewel[employer]cz  Minimally Clinically Important Difference (MCID) = 12.8%  COGNITION: Overall cognitive status:  Within functional limits for tasks assessed     SENSATION: Patient reports no numbness or tingling  POSTURE: No Significant postural limitations  PALPATION: TTP: left quadratus lumborum   JOINT MOBILITY:  L1-3: WFL and nonpainful   L3-S1: WFL and tender   LUMBAR ROM:   AROM eval  Flexion 54; slight pain  Extension 12; slight pain  Right lateral flexion 50% limited  Left lateral flexion 75%  limited; familiar  pain  Right rotation 25% limited  Left rotation 25% limited; slight pain   (Blank rows = not tested)  LOWER EXTREMITY ROM: WFL for activities assessed  LOWER EXTREMITY MMT:    MMT Right eval Left eval  Hip flexion 4+/5 4/5  Hip extension    Hip abduction    Hip adduction    Hip internal rotation    Hip external rotation    Knee flexion 4+/5 4+/5  Knee extension 4+/5 4/5  Ankle dorsiflexion 4+/5 4+/5  Ankle plantarflexion    Ankle inversion    Ankle eversion     (Blank rows = not tested)  FUNCTIONAL TESTS:  Bed mobility: slow and cautious movement  GAIT: Assistive device utilized: None Level of assistance: Complete Independence Comments: no significant gait deviations  TREATMENT DATE:      12/15/23:  Nustep level 3 x 15 minutes f/b patient in right sdly position with folded pillow between knees for comfort receiving STW/M with ischemic release technique x 8 minutes to left QL f/b      HMP and IFC at 80-150 Hz on 40% scan x 20 minutes to patient's left low back region in supine with leg elevator for comfort.                                                                                                                            PATIENT EDUCATION:  Education details: POC, prognosis, objective findings, and goals for physical therapy Person educated: Patient Education method: Explanation Education comprehension: verbalized understanding  HOME EXERCISE PROGRAM:   ASSESSMENT:  CLINICAL IMPRESSION: The patient did well with treatment today.  She  had increased tone over her left QL and did well with soft tissue work.  She felt good after treatment.  OBJECTIVE IMPAIRMENTS: decreased activity tolerance, decreased ROM, decreased strength, impaired tone, and pain.   ACTIVITY LIMITATIONS: bending and bed mobility  PARTICIPATION LIMITATIONS: cleaning, laundry, and community activity  PERSONAL FACTORS: Past/current experiences and 3+ comorbidities: Hypertension, diabetes type 2, hearing loss, arthritis, depression, and previous back surgery are also affecting patient's functional outcome.   REHAB POTENTIAL: Good  CLINICAL DECISION MAKING: Stable/uncomplicated  EVALUATION COMPLEXITY: Low   GOALS: Goals reviewed with patient? Yes  LONG TERM GOALS: Target date: 01/06/24  Patient will be independent with her HEP. Baseline:  Goal status: INITIAL  2.  Patient will be able to complete her household activities without her familiar symptoms exceeding 5/10. Baseline:  Goal status: INITIAL  3.  Patient will improve her ODI score to 5% disability or less for improved perceived function with her daily activities. Baseline:  Goal status: INITIAL  4.  Patient will be able to transfer from supine to sitting without reproducing her familiar symptoms for improved mobility getting out of bed. Baseline:  Goal status: INITIAL  PLAN:  PT FREQUENCY: 1-2x/week  PT DURATION: 4 weeks  PLANNED INTERVENTIONS: 97164- PT Re-evaluation, 97750- Physical Performance Testing, 97110-Therapeutic exercises, 97530- Therapeutic activity, [id number]- Neuromuscular  re-education, [phone]- Self Care, 02859- Manual therapy, G0283- Electrical stimulation (unattended), [id number]- Ultrasound, [id number]- Traction (mechanical), [phone] (1-2 muscles), 20561 (3+ muscles)- Dry Needling, Patient/Family education, Balance training, Joint mobilization, Spinal mobilization, Cryotherapy, and Moist heat.  PLAN FOR NEXT SESSION: NuStep, manual therapy, lumbar stabilization, lower extremity  strengthening, and modalities as needed   Milinda Sweeney, [place], PT 12/15/2023, 8:59 AM

## 2023-12-17 ENCOUNTER — Ambulatory Visit: Admitting: *Deleted

## 2023-12-17 DIAGNOSIS — M5459 Other low back pain: Secondary | ICD-10-CM | POA: Diagnosis not present

## 2023-12-17 DIAGNOSIS — M5416 Radiculopathy, lumbar region: Secondary | ICD-10-CM

## 2023-12-17 DIAGNOSIS — M4727 Other spondylosis with radiculopathy, lumbosacral region: Secondary | ICD-10-CM | POA: Diagnosis not present

## 2023-12-17 DIAGNOSIS — M51369 Other intervertebral disc degeneration, lumbar region without mention of lumbar back pain or lower extremity pain: Secondary | ICD-10-CM | POA: Diagnosis not present

## 2023-12-17 DIAGNOSIS — M4316 Spondylolisthesis, lumbar region: Secondary | ICD-10-CM | POA: Diagnosis not present

## 2023-12-17 NOTE — Therapy (Signed)
 OUTPATIENT PHYSICAL THERAPY THORACOLUMBAR TREATMENT  Patient Name: Lisa Stephens MRN: [id number] DOB:01-24-1956, 68 y.o., female Today's Date: 12/17/2023  END OF SESSION:  PT End of Session - 12/17/23 1023     Visit Number 3    Number of Visits 8    Date for PT Re-Evaluation 02/05/24    PT Start Time 1015          Past Medical History:  Diagnosis Date   Arthritis    Blood transfusion without reported diagnosis    Cataract    small beginning   Depression    Diabetes mellitus without complication (HCC)    Fracture of distal end of right radius 08/21/2017   Hyperlipidemia    Hypertension    Hypothyroidism    Past Surgical History:  Procedure Laterality Date   ABDOMINAL HYSTERECTOMY     BACK SURGERY  09/2015   revision   MAXIMUM ACCESS (MAS)POSTERIOR LUMBAR INTERBODY FUSION (PLIF) 2 LEVEL N/A 07/02/2015   Procedure: L3-4 L4-5 Maximum access posterior lumbar interbody fusion;  Surgeon: Morene Hicks Ditty, MD;  Location: [facility];  Service: Neurosurgery;  Laterality: N/A;  L3-4 L4-5 Maximum access posterior lumbar interbody fusion   TUBAL LIGATION     Patient Active Problem List   Diagnosis Date Noted   Lumbar adjacent segment disease with spondylolisthesis 08/13/2022   Rotator cuff tendinitis, right 04/04/2020   Foot drop, right 09/26/2015   Stress incontinence in female 08/23/2015   Hearing loss 08/23/2015   Lumbosacral spondylosis with radiculopathy 07/02/2015   Hypothyroidism 05/28/2015   Vitamin D  deficiency 05/28/2015   Diabetes mellitus treated with oral medication (HCC) 12/08/2013   Hypertension associated with diabetes (HCC) 12/08/2013   Obesity 12/08/2013   Hyperlipidemia associated with type 2 diabetes mellitus (HCC) 09/07/2013   REFERRING PROVIDER: Jolinda Norene HERO, DO   REFERRING DIAG: Lumbosacral spondylosis with radiculopathy, Lumbar adjacent segment disease with spondylolisthesis   Rationale for Evaluation and Treatment:  Rehabilitation  THERAPY DIAG:  Other low back pain  Radiculopathy, lumbar region  ONSET DATE: approximately 2 weeks ago  SUBJECTIVE:                                                                                                                                                                                           SUBJECTIVE STATEMENT: Patient states her pain is low today.  About a 1-2/10.Did okay after last RX PERTINENT HISTORY:  Hypertension, diabetes type 2, hearing loss, arthritis, depression, and previous back surgery  PAIN:  Are you having pain? Yes: NPRS scale: Current: 2/10. Pain location: left low back Pain description: catch  Aggravating factors: bending, twisting, rolling  over Relieving factors: light activity  PRECAUTIONS: None  RED FLAGS: None   WEIGHT BEARING RESTRICTIONS: No  FALLS:  Has patient fallen in last 6 months? No  LIVING ENVIRONMENT: Lives with: lives with their spouse Lives in: House/apartment Stairs: Yes: External: 2-3 steps; none Has following equipment at home: None  OCCUPATION: retired  PLOF: Independent  PATIENT GOALS: reduced pain  NEXT MD VISIT: 12/14/23  OBJECTIVE:  Note: Objective measures were completed at Evaluation unless otherwise noted.  DIAGNOSTIC FINDINGS: 12/01/23 lumbar x-ray Exam results are not yet released  PATIENT SURVEYS:  Modified Oswestry:  MODIFIED OSWESTRY DISABILITY SCALE  Date: 12/09/23 Score  Pain intensity 1 = The pain is bad, but I can manage without having to take (1) I can stand as long as I want but, it increases my pain. pain medication.  2. Personal care (washing, dressing, etc.) 0 =  I can take care of myself normally without causing increased pain.  3. Lifting 0 = I can lift heavy weights without increased pain.  4. Walking 1 = Pain prevents me from walking more than 1 mile.  5. Sitting 0 =  I can sit in any chair as long as I like.  6. Standing 3 =  Pain prevents me from standing more than  1/2 hour.  7. Sleeping 0 = Pain does not prevent me from sleeping well.  8. Social Life 2 = Pain prevents me from participating in more energetic activities (eg. sports, dancing).  9. Traveling 0 =  I can travel anywhere without increased pain.  10. Employment/ Homemaking 2 = I can perform most of my homemaking/job duties, but pain prevents me from performing more physically stressful activities (eg, lifting, vacuuming).  Total 9/50   Interpretation of scores: Score Category Description  0-20% Minimal Disability The patient can cope with most living activities. Usually no treatment is indicated apart from advice on lifting, sitting and exercise  21-40% Moderate Disability The patient experiences more pain and difficulty with sitting, lifting and standing. Travel and social life are more difficult and they may be disabled from work. Personal care, sexual activity and sleeping are not grossly affected, and the patient can usually be managed by conservative means  41-60% Severe Disability Pain remains the main problem in this group, but activities of daily living are affected. These patients require a detailed investigation  61-80% Crippled Back pain impinges on all aspects of the patient's life. Positive intervention is required  81-100% Bed-bound  These patients are either bed-bound or exaggerating their symptoms  Bluford FORBES [name], et al. Surgery versus conservative management of stable thoracolumbar fracture: the PRESTO feasibility RCT. [place] ([place]): [employer]; 2021 Nov. [facility] Technology Assessment, No. 25.62.) Appendix 3, Oswestry Disability Index category descriptors. Available from: [employer]cz  Minimally Clinically Important Difference (MCID) = 12.8%  COGNITION: Overall cognitive status: Within functional limits for tasks assessed     SENSATION: Patient reports no numbness or tingling  POSTURE: No Significant postural  limitations  PALPATION: TTP: left quadratus lumborum   JOINT MOBILITY:  L1-3: WFL and nonpainful   L3-S1: WFL and tender   LUMBAR ROM:   AROM eval  Flexion 54; slight pain  Extension 12; slight pain  Right lateral flexion 50% limited  Left lateral flexion 75%  limited; familiar pain  Right rotation 25% limited  Left rotation 25% limited; slight pain   (Blank rows = not tested)  LOWER EXTREMITY ROM: WFL for activities assessed  LOWER EXTREMITY MMT:  MMT Right eval Left eval  Hip flexion 4+/5 4/5  Hip extension    Hip abduction    Hip adduction    Hip internal rotation    Hip external rotation    Knee flexion 4+/5 4+/5  Knee extension 4+/5 4/5  Ankle dorsiflexion 4+/5 4+/5  Ankle plantarflexion    Ankle inversion    Ankle eversion     (Blank rows = not tested)  FUNCTIONAL TESTS:  Bed mobility: slow and cautious movement  GAIT: Assistive device utilized: None Level of assistance: Complete Independence Comments: no significant gait deviations  TREATMENT DATE:      12/17/23:  Nustep level 3 x 15 minutes XTS blue lat pull downs 3x10 and Rows 4 x 10 HEP green tband  ROWS and EXT x10    handout given HMP and IFC at 80-150 Hz on 40% scan x 15 minutes to patient's left low back region in seated position.                                                                                                                            PATIENT EDUCATION:  Education details: POC, prognosis, objective findings, and goals for physical therapy Person educated: Patient Education method: Explanation Education comprehension: verbalized understanding  HOME EXERCISE PROGRAM:   ASSESSMENT:  CLINICAL IMPRESSION: The patient did well with treatment today and reports doing better with activities at home.  She was able to perform core activation and strengthening today and did great.  She felt good after treatment.  OBJECTIVE IMPAIRMENTS: decreased activity tolerance,  decreased ROM, decreased strength, impaired tone, and pain.   ACTIVITY LIMITATIONS: bending and bed mobility  PARTICIPATION LIMITATIONS: cleaning, laundry, and community activity  PERSONAL FACTORS: Past/current experiences and 3+ comorbidities: Hypertension, diabetes type 2, hearing loss, arthritis, depression, and previous back surgery are also affecting patient's functional outcome.   REHAB POTENTIAL: Good  CLINICAL DECISION MAKING: Stable/uncomplicated  EVALUATION COMPLEXITY: Low   GOALS: Goals reviewed with patient? Yes  LONG TERM GOALS: Target date: 01/06/24  Patient will be independent with her HEP. Baseline:  Goal status: INITIAL  2.  Patient will be able to complete her household activities without her familiar symptoms exceeding 5/10. Baseline:  Goal status: INITIAL  3.  Patient will improve her ODI score to 5% disability or less for improved perceived function with her daily activities. Baseline:  Goal status: INITIAL  4.  Patient will be able to transfer from supine to sitting without reproducing her familiar symptoms for improved mobility getting out of bed. Baseline:  Goal status: INITIAL  PLAN:  PT FREQUENCY: 1-2x/week  PT DURATION: 4 weeks  PLANNED INTERVENTIONS: 97164- PT Re-evaluation, 97750- Physical Performance Testing, 97110-Therapeutic exercises, 97530- Therapeutic activity, [id number]- Neuromuscular re-education, 97535- Self Care, 02859- Manual therapy, G0283- Electrical stimulation (unattended), 97035- Ultrasound, 02987- Traction (mechanical), 20560 (1-2 muscles), 20561 (3+ muscles)- Dry Needling, Patient/Family education, Balance training, Joint mobilization, Spinal mobilization, Cryotherapy, and Moist heat.  PLAN FOR NEXT  SESSION: NuStep, manual therapy, lumbar stabilization, lower extremity strengthening, and modalities as needed   Lisa Stephens,[place], PTA 12/17/2023, 11:12 AM

## 2023-12-18 ENCOUNTER — Other Ambulatory Visit (INDEPENDENT_AMBULATORY_CARE_PROVIDER_SITE_OTHER)

## 2023-12-18 ENCOUNTER — Telehealth: Payer: Self-pay | Admitting: Pharmacist

## 2023-12-18 DIAGNOSIS — Z7984 Long term (current) use of oral hypoglycemic drugs: Secondary | ICD-10-CM

## 2023-12-18 DIAGNOSIS — E119 Type 2 diabetes mellitus without complications: Secondary | ICD-10-CM

## 2023-12-18 NOTE — Progress Notes (Signed)
 12/18/2023 Name: Berenise Hunton MRN: [id number] DOB: 1955/05/18  Chief Complaint  Patient presents with   Diabetes   Hyperlipidemia    Lisa Stephens is a 68 y.o. year old female who presented for a telephone visit.   They were referred to the pharmacist by their PCP for assistance in managing diabetes and medication access.    Subjective:  Patient reports she is having difficulty affording her monthly Rybelsus .  She would like additional weight loss and ease of weekly injection with Ozempic .  She denies side effects of current Rybelsus  14mg  daily.  Care Team: Primary Care Provider: Jolinda Norene HERO, DO    Medication Access/Adherence  Current Pharmacy:  [facility] [address], [place] - [address] [name] [address] Phone: [phone] Fax: [phone]   Patient reports affordability concerns with their medications: Yes  Patient reports access/transportation concerns to their pharmacy: No  Patient reports adherence concerns with their medications:  Yes  statins   Diabetes:  Current medications: Rybelsus  14 mg daily Medications tried in the past: metformin --d/c'd due to control on rybelsus   Current glucose readings: FBG 102 this AM; FBG<130   Patient denies hypoglycemic s/sx including dizziness, shakiness, sweating. Patient denies hyperglycemic symptoms including polyuria, polydipsia, polyphagia, nocturia, neuropathy, blurred vision.  Current meal patterns:  Discussed meal planning options and Plate method for healthy eating Avoid sugary drinks and desserts Incorporate balanced protein, non starchy veggies, 1 serving of carbohydrate with each meal Increase water intake Increase physical activity as able   Current medication access support: n/a; will enroll in novo nordisk PAP  Macrovascular and Microvascular Risk Reduction:  Statin? yes (awaiting shipment of livalo ); ACEi/ARB? yes (CCB.ARB combo) Last urinary albumin /creatinine  ratio:  Lab Results  Component Value Date   MICRALBCREAT <6 04/24/2023   MICRALBCREAT <11 04/09/2022   Last eye exam:  Lab Results  Component Value Date   HMDIABEYEEXA No Retinopathy 07/07/2022   Last foot exam: 12/14/2023 Tobacco Use:  Tobacco Use: Low Risk  (12/14/2023)   Patient History    Smoking Tobacco Use: Never    Smokeless Tobacco Use: Never    Passive Exposure: Not on file     Objective:  Lab Results  Component Value Date   HGBA1C 6.1 (H) 12/11/2023    Lab Results  Component Value Date   CREATININE 0.60 12/11/2023   BUN 11 12/11/2023   NA 133 (L) 12/11/2023   K 4.1 12/11/2023   CL 108 (H) 12/11/2023   CO2 21 12/11/2023    Lab Results  Component Value Date   CHOL 192 12/11/2023   HDL 64 12/11/2023   LDLCALC 114 (H) 12/11/2023   TRIG 78 12/11/2023   CHOLHDL 3.0 12/11/2023    Medications Reviewed Today     Reviewed by Billee Mliss BIRCH, [facility] (Pharmacist) on 12/18/23 at 1527  Med List Status: <None>   Medication Order Taking? Sig Documenting Provider Last Dose Status Informant  acetaminophen  (TYLENOL ) 500 MG tablet [id number]  Take 1 tablet (500 mg total) by mouth every 6 (six) hours as needed. Ijaola, Onyeje M, NP  Active Self  amLODipine -olmesartan  (AZOR ) 10-40 MG tablet [id number]  Take 1 tablet by mouth daily. Jolinda Norene M, DO  Active   aspirin  EC 81 MG tablet [id number]  Take 81 mg by mouth daily. [provider]  Active Self           Med Note CHRISTIE ALYSON Schaumann Aug 07, 2022  4:25 PM) On  hold for procedure  Blood Glucose Monitoring Suppl (CONTOUR NEXT MONITOR) w/Device KIT [id number]   [provider]  Active Self  Cholecalciferol  (VITAMIN D -3) 125 MCG (5000 UT) TABS [id number]  Take 5,000 Units by mouth daily. [provider]  Active Self  Coenzyme Q10 (CO Q 10 PO) [id number]  Take 500 mg by mouth daily. [provider]  Active Self           Med Note CHRISTIE ALYSON Schaumann Aug 07, 2022  4:26 PM)     CONTOUR NEXT TEST test strip [id number]   [provider]  Active Self  glucose blood (CONTOUR NEXT TEST) test strip [id number]  as directed per package instructions In Vitro twice a day for 90 days [provider]  Active Self  Lancets (ONETOUCH ULTRASOFT) lancets [id number]  1 Device by Misc.(Non-Drug; Combo Route) route daily. [provider]  Active Self  levothyroxine  (SYNTHROID ) 100 MCG tablet [id number]  TAKE 1 TABLET BY MOUTH  DAILY BEFORE BREAKFAST Gottschalk, Ashly M, DO  Active   methocarbamol  (ROBAXIN ) 500 MG tablet [id number]  Take 1 tablet (500 mg total) by mouth every 8 (eight) hours as needed for muscle spasms. Cabbell, Kyle, MD  Active   Multiple Vitamins-Minerals (MULTIVITAMIN WITH MINERALS) tablet [id number]  Take 1 tablet by mouth daily. [provider]  Active Self  Omega-3 Fatty Acids (FISH OIL) 1000 MG CAPS [id number]  Take 1,000 mg by mouth daily. [provider]  Active Self  Pitavastatin  Magnesium  4 MG TABS [id number]  Take 1 tablet (4 mg total) by mouth daily. To replace atorvastatin  Jolinda Norene HERO, DO  Active   Semaglutide  (RYBELSUS ) 14 MG TABS [id number]  Take 1 tablet (14 mg total) by mouth daily. Jolinda Norene M, DO  Active               Assessment/Plan:   Diabetes: - Currently controlled; goal A1c <7%. Cardiorenal risk reduction is optimized.. Blood pressure is at goal <130/80. LDL is not at goal.  - Reviewed long term cardiovascular and renal outcomes of uncontrolled blood sugar. and Reviewed goal A1c, goal fasting, and goal 2 hour post prandial glucose. Recommended to check glucose FBG or if symptomatic - Recommend to start Ozempic  0.5mg  weekly next week at [facility]arm[facility] appt and increase to 1mg  after 4 weeks; apply for PAP  . - Patient denies personal or family history of multiple endocrine neoplasia type 2, medullary thyroid  cancer; personal history of pancreatitis or gallbladder disease., Discussed side effects of  gastrointestinal upset/nausea; eating smaller meals, avoiding high-fat foods, and remaining upright after eating may reduce nausea. Discussed that overeating is a major trigger of nausea with this class of medications, as often times patients will start to feel full sooner and may need to decrease portion sizes from what they were previously accustomed to.   Follow Up Plan: PHARMD APPT ON 12/22/23  Mliss Tarry Griffin, PharmD, BCACP, CPP Clinical Pharmacist, [facility]

## 2023-12-18 NOTE — Telephone Encounter (Signed)
 Patient to come enroll in Ozempic  PAP New start ozempic  Not on med list currently Will need Ozempic  1mg  weekly (maybe titrate to 2mg ) Will update med list when patient comes in on 12/22/23  Please email me PAP for Ozempic 

## 2023-12-21 ENCOUNTER — Other Ambulatory Visit (HOSPITAL_COMMUNITY): Payer: Self-pay

## 2023-12-21 ENCOUNTER — Telehealth: Payer: Self-pay

## 2023-12-21 NOTE — Progress Notes (Unsigned)
 12/22/2023 Name: Lisa Stephens MRN: [id number] DOB: [birth year]  Chief Complaint  Patient presents with   Diabetes   Hyperlipidemia    Lisa Stephens is a 68 y.o. year old female who was referred for medication management by their primary care provider, Lisa Stephens. They presented for a face to face visit today.   They were referred to the pharmacist by their PCP for assistance in managing diabetes and medication access    Subjective: Lisa Stephens presents to clinic for follow-up for Ozempic  PAP enrollment and medication management. She was unable to afford her Rybelsus  prescription and wanted to transition to a weekly injection for additional weight loss. She tolerated Rybelsus  well. She has an unopened bottle of Rybelsus  at home, and has not taken today's dose in anticipation for receiving her first Ozempic  dose today.   She did not receive her shipment of Livalo , but did receive a letter in the mail regarding the medication. She is unsure of what the letter said, but is still taking atorvastatin  three times a week, endorses adherence.   Care Team: Primary Care Provider: Jolinda Potter HERO, Stephens ; Next Scheduled Visit: 03/16/24  Medication Access/Adherence  Current Pharmacy:  [facility] [address] [place] - [address] [name] [address] Phone: [phone] Fax: [phone]   Patient reports affordability concerns with their medications: No  Patient reports access/transportation concerns to their pharmacy: No  Patient reports adherence concerns with their medications:  Yes  -  History of intolerance to atorvastatin  and rosuvastatin, transitioning to pitvastatin Difficulty affording Rybelsus , transitioning to Ozempic  via Novo Nordisk PAP  Diabetes: Current medications: Rybelsus  14 mg daily --> Ozempic  0.5 mg weekly (today, 9/16) Medications tried in the past: metformin  (transitioned to Rybelsus  monotherapy) Statin: Pitvastatin 4 mg daily  (pending shipment), currently still taking atorvastatin  three times weekly LDL of 103 on 10/17/23 ACEi/ARB: amlodipine -olmesartan  10-40 mg daily UACR of <6 on 04/24/23  A1c of 6.1% on 12/11/23, slightly increased from 5.9% on 04/24/23  Current glucose readings:  Using Contour meter; testing 1-2 times daily FBG: 102-120s mg/dL  Patient denies hypoglycemic s/sx including dizziness, shakiness, sweating. Patient denies hyperglycemic symptoms including polyuria, polydipsia, polyphagia, nocturia, neuropathy, blurred vision.  Current meal patterns:  Discussed meal planning options and Plate method for healthy eating Avoid sugary drinks and desserts Incorporate balanced protein, non starchy veggies, 1 serving of carbohydrate with each meal Increase water intake Increase physical activity as able  Current medication access support:  Enrolling in [employer] PAP, paperwork signed today (9/16) [employer]  Hyperlipidemia/ASCVD Risk Reduction Current lipid lowering medications: pitvastatin 4 mg daily (pending shipment), still taking atorvastatin  three times weekly while waiting for shipment Medications tried in the past: rosuvastatin, atorvastatin  - experienced myalgias on both  The 10-year ASCVD risk score (Arnett DK, et al., 2019) is: 29.1%   Values used to calculate the score:     Age: 89 years     Clincally relevant sex: Female     Is Non-Hispanic African American: Yes     Diabetic: Yes     Tobacco smoker: No     Systolic Blood Pressure: 149 mmHg     Is BP treated: Yes     HDL Cholesterol: 64 mg/dL     Total Cholesterol: 192 mg/dL   Objective: Lab Results  Component Value Date   HGBA1C 6.1 (H) 12/11/2023   Lab Results  Component Value Date   CREATININE 0.60 12/11/2023   BUN 11 12/11/2023  NA 133 (L) 12/11/2023   K 4.1 12/11/2023   CL 108 (H) 12/11/2023   CO2 21 12/11/2023   Lab Results  Component Value Date   CHOL 192 12/11/2023   HDL 64 12/11/2023   LDLCALC 114  (H) 12/11/2023   TRIG 78 12/11/2023   CHOLHDL 3.0 12/11/2023   Medications Reviewed Today     Reviewed by Lisa Stephens, [facility] (Pharmacist) on 12/22/23 at 1117  Med List Status: <None>   Medication Order Taking? Sig Documenting Provider Last Dose Status Informant  acetaminophen  (TYLENOL ) 500 MG tablet [id number]  Take 1 tablet (500 mg total) by mouth every 6 (six) hours as needed. Lisa Stephens  Active Self  amLODipine -olmesartan  (AZOR ) 10-40 MG tablet [id number]  Take 1 tablet by mouth daily. Lisa Stephens  Active   aspirin  EC 81 MG tablet [id number]  Take 81 mg by mouth daily. Provider, Historical, Stephens  Active Self           Med Note Lisa Stephens May 2, Stephens  4:25 PM) On hold for procedure  Blood Glucose Monitoring Suppl (CONTOUR NEXT MONITOR) w/Device KIT [id number]   Provider, Historical, Stephens  Active Self  Cholecalciferol  (VITAMIN D -3) 125 MCG (5000 UT) TABS [id number]  Take 5,000 Units by mouth daily. Provider, Historical, Stephens  Active Self  Coenzyme Q10 (CO Q 10 PO) [id number]  Take 500 mg by mouth daily. Provider, Historical, Stephens  Active Self           Med Note Lisa Stephens May 2, Stephens  4:26 PM)    CONTOUR NEXT TEST test strip [id number]   Provider, Historical, Stephens  Active Self  glucose blood (CONTOUR NEXT TEST) test strip [id number]  as directed per package instructions In Vitro twice a day for 90 days Provider, Historical, Stephens  Active Self  Lancets (ONETOUCH ULTRASOFT) lancets [id number]  1 Device by Misc.(Non-Drug; Combo Route) route daily. Provider, Historical, Stephens  Active Self  levothyroxine  (SYNTHROID ) 100 MCG tablet [id number]  TAKE 1 TABLET BY MOUTH  DAILY BEFORE BREAKFAST Lisa Stephens  Active   methocarbamol  (ROBAXIN ) 500 MG tablet [id number]  Take 1 tablet (500 mg total) by mouth every 8 (eight) hours as needed for muscle spasms. Lisa Stephens  Active   Multiple Vitamins-Minerals (MULTIVITAMIN WITH MINERALS) tablet [id number]  Take 1 tablet  by mouth daily. Provider, Historical, Stephens  Active Self  Omega-3 Fatty Acids (FISH OIL) 1000 MG CAPS [id number]  Take 1,000 mg by mouth daily. Provider, Historical, Stephens  Active Self  Pitavastatin  Magnesium  4 MG TABS [id number]  Take 1 tablet (4 mg total) by mouth daily. To replace atorvastatin  Lisa Potter HERO, Stephens  Active     Discontinued 12/22/23 1116 (Change in therapy)   Semaglutide ,0.25 or 0.5MG /DOS, (OZEMPIC , 0.25 OR 0.5 MG/DOSE,) 2 MG/3ML SOPN [id number] Yes Inject 0.5 mg into the skin once a week. Lisa Stephens  Active             Assessment/Plan:  Diabetes: Currently controlled Goal of <7% Reviewed long term cardiovascular and renal outcomes of uncontrolled blood sugar Reviewed goal A1c, goal fasting, and goal 2 hour post prandial glucose Recommend to:  STOP Rybelsus   She did not take her Rybelsus  dose today in anticipation for her first Ozempic  injection.  START Ozempic  0.5 mg weekly x 4 weeks, then titrate to Ozempic  1 mg weekly  (pending PAP approval) One sample of 0.25/0.5 mg pen provided and assisted  the patient in injecting her first dose today. Provided counseling on side effects and administration of Ozempic .  Enrolled in Novo Nordisk PAP for Ozempic  1 mg weekly Patient denies personal or family history of multiple endocrine neoplasia type 2, medullary thyroid  cancer; personal history of pancreatitis or gallbladder disease. Recommend to check glucose daily  Hyperlipidemia/ASCVD Risk Reduction: Currently uncontrolled.  Reviewed long term complications of uncontrolled cholesterol Recommend to: START pitvastatin 4 mg daily (once shipment arrives, already shipped from [employer]) In the meantime, continue atorvastatin  three times weekly.  G72 added for statin myopathy Considering history of statin intolerance, patient may benefit from an additional therapy in the future (e.g., PCSK9i, Zetia)  Follow Up Plan:  PCP on 03/16/24 PharmD on 01/12/24  Woodie Jock, PharmD PGY1 Pharmacy Resident  12/22/2023  Mliss Tarry Griffin, PharmD, BCACP, CPP Clinical Pharmacist, [facility]

## 2023-12-21 NOTE — Progress Notes (Signed)
 Pharmacy Medication Assistance Program Note    12/24/2023  Patient ID: Lisa Stephens, female   DOB: September 08, 1955, 68 y.o.   MRN: [id number]     12/21/2023  Outreach Medication One  Manufacturer Medication One Novo Nordisk  Nordisk Drugs Ozempic   Type of Radiographer, therapeutic Assistance  Date Application Received From Provider 12/23/2023  Date Application Submitted to Manufacturer 12/24/2023  Method Application Sent to Manufacturer Fax     New - submitted

## 2023-12-22 ENCOUNTER — Ambulatory Visit (INDEPENDENT_AMBULATORY_CARE_PROVIDER_SITE_OTHER): Admitting: Pharmacist

## 2023-12-22 DIAGNOSIS — E1169 Type 2 diabetes mellitus with other specified complication: Secondary | ICD-10-CM

## 2023-12-22 DIAGNOSIS — E785 Hyperlipidemia, unspecified: Secondary | ICD-10-CM | POA: Diagnosis not present

## 2023-12-22 DIAGNOSIS — G720 Drug-induced myopathy: Secondary | ICD-10-CM | POA: Diagnosis not present

## 2023-12-22 DIAGNOSIS — Z7984 Long term (current) use of oral hypoglycemic drugs: Secondary | ICD-10-CM

## 2023-12-22 DIAGNOSIS — T466X5A Adverse effect of antihyperlipidemic and antiarteriosclerotic drugs, initial encounter: Secondary | ICD-10-CM

## 2023-12-22 DIAGNOSIS — E119 Type 2 diabetes mellitus without complications: Secondary | ICD-10-CM

## 2023-12-22 MED ORDER — OZEMPIC (0.25 OR 0.5 MG/DOSE) 2 MG/3ML ~~LOC~~ SOPN: 0.5000 mg | PEN_INJECTOR | SUBCUTANEOUS | Status: DC

## 2023-12-24 ENCOUNTER — Ambulatory Visit: Admitting: *Deleted

## 2023-12-24 ENCOUNTER — Encounter: Payer: Self-pay | Admitting: *Deleted

## 2023-12-24 DIAGNOSIS — M51369 Other intervertebral disc degeneration, lumbar region without mention of lumbar back pain or lower extremity pain: Secondary | ICD-10-CM | POA: Diagnosis not present

## 2023-12-24 DIAGNOSIS — M5459 Other low back pain: Secondary | ICD-10-CM | POA: Diagnosis not present

## 2023-12-24 DIAGNOSIS — M4727 Other spondylosis with radiculopathy, lumbosacral region: Secondary | ICD-10-CM | POA: Diagnosis not present

## 2023-12-24 DIAGNOSIS — M5416 Radiculopathy, lumbar region: Secondary | ICD-10-CM

## 2023-12-24 DIAGNOSIS — M4316 Spondylolisthesis, lumbar region: Secondary | ICD-10-CM | POA: Diagnosis not present

## 2023-12-24 NOTE — Therapy (Signed)
 OUTPATIENT PHYSICAL THERAPY THORACOLUMBAR TREATMENT  Patient Name: Lisa Stephens MRN: [id number] DOB:09/11/1955, 68 y.o., female Today's Date: 12/24/2023  END OF SESSION:  PT End of Session - 12/24/23 0805     Visit Number 4    Number of Visits 8    Date for PT Re-Evaluation 02/05/24    Authorization - Number of Visits 8     PT Start Time 0800    PT Stop Time 0849    PT Time Calculation (min) 49 min          Past Medical History:  Diagnosis Date   Arthritis    Blood transfusion without reported diagnosis    Cataract    small beginning   Depression    Diabetes mellitus without complication (HCC)    Fracture of distal end of right radius 08/21/2017   Hyperlipidemia    Hypertension    Hypothyroidism    Past Surgical History:  Procedure Laterality Date   ABDOMINAL HYSTERECTOMY     BACK SURGERY  09/2015   revision   MAXIMUM ACCESS (MAS)POSTERIOR LUMBAR INTERBODY FUSION (PLIF) 2 LEVEL N/A 07/02/2015   Procedure: L3-4 L4-5 Maximum access posterior lumbar interbody fusion;  Surgeon: Morene Hicks Ditty, MD;  Location: [facility];  Service: Neurosurgery;  Laterality: N/A;  L3-4 L4-5 Maximum access posterior lumbar interbody fusion   TUBAL LIGATION     Patient Active Problem List   Diagnosis Date Noted   Lumbar adjacent segment disease with spondylolisthesis 08/13/2022   Rotator cuff tendinitis, right 04/04/2020   Foot drop, right 09/26/2015   Stress incontinence in female 08/23/2015   Hearing loss 08/23/2015   Lumbosacral spondylosis with radiculopathy 07/02/2015   Hypothyroidism 05/28/2015   Vitamin D  deficiency 05/28/2015   Diabetes mellitus treated with oral medication (HCC) 12/08/2013   Hypertension associated with diabetes (HCC) 12/08/2013   Obesity 12/08/2013   Hyperlipidemia associated with type 2 diabetes mellitus (HCC) 09/07/2013   REFERRING PROVIDER: Jolinda Norene HERO, DO   REFERRING DIAG: Lumbosacral spondylosis with radiculopathy, Lumbar adjacent  segment disease with spondylolisthesis   Rationale for Evaluation and Treatment: Rehabilitation  THERAPY DIAG:  Other low back pain  Radiculopathy, lumbar region  ONSET DATE: approximately 2 weeks ago  SUBJECTIVE:                                                                                                                                                                                           SUBJECTIVE STATEMENT: Patient states her pain is low again  today did  good after last RX PERTINENT HISTORY:  Hypertension, diabetes type 2, hearing loss, arthritis, depression, and previous back surgery  PAIN:  Are you having pain? Yes: NPRS scale: Current: 2/10. Pain location: left low back Pain description: catch  Aggravating factors: bending, twisting, rolling over Relieving factors: light activity  PRECAUTIONS: None  RED FLAGS: None   WEIGHT BEARING RESTRICTIONS: No  FALLS:  Has patient fallen in last 6 months? No  LIVING ENVIRONMENT: Lives with: lives with their spouse Lives in: House/apartment Stairs: Yes: External: 2-3 steps; none Has following equipment at home: None  OCCUPATION: retired  PLOF: Independent  PATIENT GOALS: reduced pain  NEXT MD VISIT: 12/14/23  OBJECTIVE:  Note: Objective measures were completed at Evaluation unless otherwise noted.  DIAGNOSTIC FINDINGS: 12/01/23 lumbar x-ray Exam results are not yet released  PATIENT SURVEYS:  Modified Oswestry:  MODIFIED OSWESTRY DISABILITY SCALE  Date: 12/09/23 Score  Pain intensity 1 = The pain is bad, but I can manage without having to take (1) I can stand as long as I want but, it increases my pain. pain medication.  2. Personal care (washing, dressing, etc.) 0 =  I can take care of myself normally without causing increased pain.  3. Lifting 0 = I can lift heavy weights without increased pain.  4. Walking 1 = Pain prevents me from walking more than 1 mile.  5. Sitting 0 =  I can sit in any chair as  long as I like.  6. Standing 3 =  Pain prevents me from standing more than 1/2 hour.  7. Sleeping 0 = Pain does not prevent me from sleeping well.  8. Social Life 2 = Pain prevents me from participating in more energetic activities (eg. sports, dancing).  9. Traveling 0 =  I can travel anywhere without increased pain.  10. Employment/ Homemaking 2 = I can perform most of my homemaking/job duties, but pain prevents me from performing more physically stressful activities (eg, lifting, vacuuming).  Total 9/50   Interpretation of scores: Score Category Description  0-20% Minimal Disability The patient can cope with most living activities. Usually no treatment is indicated apart from advice on lifting, sitting and exercise  21-40% Moderate Disability The patient experiences more pain and difficulty with sitting, lifting and standing. Travel and social life are more difficult and they may be disabled from work. Personal care, sexual activity and sleeping are not grossly affected, and the patient can usually be managed by conservative means  41-60% Severe Disability Pain remains the main problem in this group, but activities of daily living are affected. These patients require a detailed investigation  61-80% Crippled Back pain impinges on all aspects of the patient's life. Positive intervention is required  81-100% Bed-bound  These patients are either bed-bound or exaggerating their symptoms  Bluford FORBES [name], et al. Surgery versus conservative management of stable thoracolumbar fracture: the PRESTO feasibility RCT. [place] ([place]): [employer]; 2021 Nov. [facility] Technology Assessment, No. 25.62.) Appendix 3, Oswestry Disability Index category descriptors. Available from: FindJewel[employer].cz  Minimally Clinically Important Difference (MCID) = 12.8%  COGNITION: Overall cognitive status: Within functional limits for tasks  assessed     SENSATION: Patient reports no numbness or tingling  POSTURE: No Significant postural limitations  PALPATION: TTP: left quadratus lumborum   JOINT MOBILITY:  L1-3: WFL and nonpainful   L3-S1: WFL and tender   LUMBAR ROM:   AROM eval  Flexion 54; slight pain  Extension 12; slight pain  Right lateral flexion 50% limited  Left lateral flexion 75%  limited; familiar pain  Right rotation 25% limited  Left rotation 25% limited; slight pain   (Blank rows = not tested)  LOWER EXTREMITY ROM: WFL for activities assessed  LOWER EXTREMITY MMT:    MMT Right eval Left eval  Hip flexion 4+/5 4/5  Hip extension    Hip abduction    Hip adduction    Hip internal rotation    Hip external rotation    Knee flexion 4+/5 4+/5  Knee extension 4+/5 4/5  Ankle dorsiflexion 4+/5 4+/5  Ankle plantarflexion    Ankle inversion    Ankle eversion     (Blank rows = not tested)  FUNCTIONAL TESTS:  Bed mobility: slow and cautious movement  GAIT: Assistive device utilized: None Level of assistance: Complete Independence Comments: no significant gait deviations  TREATMENT DATE:      12/24/23:   LB  Nustep level 3 x 16 minutes XTS blue lat pull downs 3x10 and Rows 4 x 10 HEP green tband  ROWS and EXT x10    handout given HMP and IFC at 80-150 Hz on 40% scan x 15 minutes to patient's left low back region in seated position.                                                                                                                            PATIENT EDUCATION:  Education details: POC, prognosis, objective findings, and goals for physical therapy Person educated: Patient Education method: Explanation Education comprehension: verbalized understanding  HOME EXERCISE PROGRAM:   ASSESSMENT:  CLINICAL IMPRESSION: The patient arrived today reporting doing very well with minimal LBP and mainly soreness. Rx focused on therex for core strengthening did well with treatment  today and reports doing better with  ADL's at home.  Pt also reports being able to walk 3 miles with less discomfort.     OBJECTIVE IMPAIRMENTS: decreased activity tolerance, decreased ROM, decreased strength, impaired tone, and pain.   ACTIVITY LIMITATIONS: bending and bed mobility  PARTICIPATION LIMITATIONS: cleaning, laundry, and community activity  PERSONAL FACTORS: Past/current experiences and 3+ comorbidities: Hypertension, diabetes type 2, hearing loss, arthritis, depression, and previous back surgery are also affecting patient's functional outcome.   REHAB POTENTIAL: Good  CLINICAL DECISION MAKING: Stable/uncomplicated  EVALUATION COMPLEXITY: Low   GOALS: Goals reviewed with patient? Yes  LONG TERM GOALS: Target date: 01/06/24  Patient will be independent with her HEP. Baseline:  Goal status: INITIAL  2.  Patient will be able to complete her household activities without her familiar symptoms exceeding 5/10. Baseline:  Goal status: INITIAL  3.  Patient will improve her ODI score to 5% disability or less for improved perceived function with her daily activities. Baseline:  Goal status: INITIAL  4.  Patient will be able to transfer from supine to sitting without reproducing her familiar symptoms for improved mobility getting out of bed. Baseline:  Goal status: INITIAL  PLAN:  PT FREQUENCY: 1-2x/week  PT DURATION: 4 weeks  PLANNED INTERVENTIONS: 02835- PT Re-evaluation, 97750- Physical Performance Testing,  97110-Therapeutic exercises, 97530- Therapeutic activity, [id number]- Neuromuscular re-education, 97535- Self Care, 02859- Manual therapy, G0283- Electrical stimulation (unattended), 97035- Ultrasound, [id number]- Traction (mechanical), [id number] (1-2 muscles), 20561 (3+ muscles)- Dry Needling, Patient/Family education, Balance training, Joint mobilization, Spinal mobilization, Cryotherapy, and Moist heat.  PLAN FOR NEXT SESSION: NuStep, manual therapy, lumbar stabilization,  lower extremity strengthening, and modalities as needed   Belanna Manring,CHRIS, PTA 12/24/2023, 9:33 AM

## 2023-12-28 ENCOUNTER — Ambulatory Visit (HOSPITAL_COMMUNITY)
Admission: RE | Admit: 2023-12-28 | Discharge: 2023-12-28 | Disposition: A | Discharge status: Home / Self Care | Source: Ambulatory Visit | Attending: Family Medicine | Admitting: Family Medicine

## 2023-12-28 DIAGNOSIS — Z1231 Encounter for screening mammogram for malignant neoplasm of breast: Secondary | ICD-10-CM | POA: Diagnosis not present

## 2023-12-29 ENCOUNTER — Telehealth: Payer: Self-pay | Admitting: Family Medicine

## 2023-12-29 ENCOUNTER — Ambulatory Visit: Admitting: Physical Therapy

## 2023-12-29 DIAGNOSIS — M5459 Other low back pain: Secondary | ICD-10-CM | POA: Diagnosis not present

## 2023-12-29 DIAGNOSIS — M5416 Radiculopathy, lumbar region: Secondary | ICD-10-CM

## 2023-12-29 DIAGNOSIS — M51369 Other intervertebral disc degeneration, lumbar region without mention of lumbar back pain or lower extremity pain: Secondary | ICD-10-CM | POA: Diagnosis not present

## 2023-12-29 DIAGNOSIS — M4316 Spondylolisthesis, lumbar region: Secondary | ICD-10-CM | POA: Diagnosis not present

## 2023-12-29 DIAGNOSIS — M4727 Other spondylosis with radiculopathy, lumbosacral region: Secondary | ICD-10-CM | POA: Diagnosis not present

## 2023-12-29 NOTE — Therapy (Signed)
 OUTPATIENT PHYSICAL THERAPY THORACOLUMBAR TREATMENT  Patient Name: Lisa Stephens MRN: [id number] DOB:1955/05/15, 68 y.o., female Today's Date: 12/29/2023  END OF SESSION:  PT End of Session - 12/29/23 1356     Visit Number 5    Number of Visits 8    Date for Recertification  02/05/24    PT Start Time 0145    PT Stop Time 0233    PT Time Calculation (min) 48 min    Activity Tolerance Patient tolerated treatment well    Behavior During Therapy [facility] for tasks assessed/performed          Past Medical History:  Diagnosis Date   Arthritis    Blood transfusion without reported diagnosis    Cataract    small beginning   Depression    Diabetes mellitus without complication (HCC)    Fracture of distal end of right radius 08/21/2017   Hyperlipidemia    Hypertension    Hypothyroidism    Past Surgical History:  Procedure Laterality Date   ABDOMINAL HYSTERECTOMY     BACK SURGERY  09/2015   revision   MAXIMUM ACCESS (MAS)POSTERIOR LUMBAR INTERBODY FUSION (PLIF) 2 LEVEL N/A 07/02/2015   Procedure: L3-4 L4-5 Maximum access posterior lumbar interbody fusion;  Surgeon: Morene Hicks Ditty, MD;  Location: [facility];  Service: Neurosurgery;  Laterality: N/A;  L3-4 L4-5 Maximum access posterior lumbar interbody fusion   TUBAL LIGATION     Patient Active Problem List   Diagnosis Date Noted   Lumbar adjacent segment disease with spondylolisthesis 08/13/2022   Rotator cuff tendinitis, right 04/04/2020   Foot drop, right 09/26/2015   Stress incontinence in female 08/23/2015   Hearing loss 08/23/2015   Lumbosacral spondylosis with radiculopathy 07/02/2015   Hypothyroidism 05/28/2015   Vitamin D  deficiency 05/28/2015   Diabetes mellitus treated with oral medication (HCC) 12/08/2013   Hypertension associated with diabetes (HCC) 12/08/2013   Obesity 12/08/2013   Hyperlipidemia associated with type 2 diabetes mellitus (HCC) 09/07/2013   REFERRING PROVIDER: Jolinda Norene HERO, DO    REFERRING DIAG: Lumbosacral spondylosis with radiculopathy, Lumbar adjacent segment disease with spondylolisthesis   Rationale for Evaluation and Treatment: Rehabilitation  THERAPY DIAG:  Other low back pain  Radiculopathy, lumbar region  ONSET DATE: approximately 2 weeks ago  SUBJECTIVE:                                                                                                                                                                                           SUBJECTIVE STATEMENT: Patient states her pain is low again. PERTINENT HISTORY:  Hypertension, diabetes type 2, hearing loss, arthritis, depression, and previous back  surgery  PAIN:  Are you having pain? Yes: NPRS scale: Current: Low/10. Pain location: left low back Pain description: catch  Aggravating factors: bending, twisting, rolling over Relieving factors: light activity  PRECAUTIONS: None  RED FLAGS: None   WEIGHT BEARING RESTRICTIONS: No  FALLS:  Has patient fallen in last 6 months? No  LIVING ENVIRONMENT: Lives with: lives with their spouse Lives in: House/apartment Stairs: Yes: External: 2-3 steps; none Has following equipment at home: None  OCCUPATION: retired  PLOF: Independent  PATIENT GOALS: reduced pain  NEXT MD VISIT: 12/14/23  OBJECTIVE:  Note: Objective measures were completed at Evaluation unless otherwise noted.  DIAGNOSTIC FINDINGS: 12/01/23 lumbar x-ray Exam results are not yet released  PATIENT SURVEYS:  Modified Oswestry:  MODIFIED OSWESTRY DISABILITY SCALE  Date: 12/09/23 Score  Pain intensity 1 = The pain is bad, but I can manage without having to take (1) I can stand as long as I want but, it increases my pain. pain medication.  2. Personal care (washing, dressing, etc.) 0 =  I can take care of myself normally without causing increased pain.  3. Lifting 0 = I can lift heavy weights without increased pain.  4. Walking 1 = Pain prevents me from walking more than 1  mile.  5. Sitting 0 =  I can sit in any chair as long as I like.  6. Standing 3 =  Pain prevents me from standing more than 1/2 hour.  7. Sleeping 0 = Pain does not prevent me from sleeping well.  8. Social Life 2 = Pain prevents me from participating in more energetic activities (eg. sports, dancing).  9. Traveling 0 =  I can travel anywhere without increased pain.  10. Employment/ Homemaking 2 = I can perform most of my homemaking/job duties, but pain prevents me from performing more physically stressful activities (eg, lifting, vacuuming).  Total 9/50   Interpretation of scores: Score Category Description  0-20% Minimal Disability The patient can cope with most living activities. Usually no treatment is indicated apart from advice on lifting, sitting and exercise  21-40% Moderate Disability The patient experiences more pain and difficulty with sitting, lifting and standing. Travel and social life are more difficult and they may be disabled from work. Personal care, sexual activity and sleeping are not grossly affected, and the patient can usually be managed by conservative means  41-60% Severe Disability Pain remains the main problem in this group, but activities of daily living are affected. These patients require a detailed investigation  61-80% Crippled Back pain impinges on all aspects of the patient's life. Positive intervention is required  81-100% Bed-bound  These patients are either bed-bound or exaggerating their symptoms  Bluford FORBES [name], et al. Surgery versus conservative management of stable thoracolumbar fracture: the PRESTO feasibility RCT. [place] ([place]): [employer]; 2021 Nov. [facility] Technology Assessment, No. 25.62.) Appendix 3, Oswestry Disability Index category descriptors. Available from: FindJewelers.cz  Minimally Clinically Important Difference (MCID) = 12.8%  COGNITION: Overall cognitive status: Within functional  limits for tasks assessed     SENSATION: Patient reports no numbness or tingling  POSTURE: No Significant postural limitations  PALPATION: TTP: left quadratus lumborum   JOINT MOBILITY:  L1-3: WFL and nonpainful   L3-S1: WFL and tender   LUMBAR ROM:   AROM eval  Flexion 54; slight pain  Extension 12; slight pain  Right lateral flexion 50% limited  Left lateral flexion 75%  limited; familiar pain  Right rotation 25%  limited  Left rotation 25% limited; slight pain   (Blank rows = not tested)  LOWER EXTREMITY ROM: WFL for activities assessed  LOWER EXTREMITY MMT:    MMT Right eval Left eval  Hip flexion 4+/5 4/5  Hip extension    Hip abduction    Hip adduction    Hip internal rotation    Hip external rotation    Knee flexion 4+/5 4+/5  Knee extension 4+/5 4/5  Ankle dorsiflexion 4+/5 4+/5  Ankle plantarflexion    Ankle inversion    Ankle eversion     (Blank rows = not tested)  FUNCTIONAL TESTS:  Bed mobility: slow and cautious movement  GAIT: Assistive device utilized: None Level of assistance: Complete Independence Comments: no significant gait deviations  TREATMENT DATE:    12/29/23:  Nustep level 5 x 15 minutes f/b back extension machine with 50# x 4 minutes f/b ab machine with 50# x 4 minutes f/b HMP and IFC at 80-150 Hz on 40% scan x 20 minutes to patient's left lumbar region while seated.      12/24/23:   LB  Nustep level 3 x 16 minutes XTS blue lat pull downs 3x10 and Rows 4 x 10 HEP green tband  ROWS and EXT x10    handout given HMP and IFC at 80-150 Hz on 40% scan x 15 minutes to patient's left low back region in seated position.                                                                                                                            PATIENT EDUCATION:  Education details: POC, prognosis, objective findings, and goals for physical therapy Person educated: Patient Education method: Explanation Education comprehension:  verbalized understanding  HOME EXERCISE PROGRAM:   ASSESSMENT:  CLINICAL IMPRESSION: Patient did great today with the addition of back ext and ab machine performing with excellent technique and no complaints.  She had no reported pain after treatment.   OBJECTIVE IMPAIRMENTS: decreased activity tolerance, decreased ROM, decreased strength, impaired tone, and pain.   ACTIVITY LIMITATIONS: bending and bed mobility  PARTICIPATION LIMITATIONS: cleaning, laundry, and community activity  PERSONAL FACTORS: Past/current experiences and 3+ comorbidities: Hypertension, diabetes type 2, hearing loss, arthritis, depression, and previous back surgery are also affecting patient's functional outcome.   REHAB POTENTIAL: Good  CLINICAL DECISION MAKING: Stable/uncomplicated  EVALUATION COMPLEXITY: Low   GOALS: Goals reviewed with patient? Yes  LONG TERM GOALS: Target date: 01/06/24  Patient will be independent with her HEP. Baseline:  Goal status: INITIAL  2.  Patient will be able to complete her household activities without her familiar symptoms exceeding 5/10. Baseline:  Goal status: INITIAL  3.  Patient will improve her ODI score to 5% disability or less for improved perceived function with her daily activities. Baseline:  Goal status: INITIAL  4.  Patient will be able to transfer from supine to sitting without reproducing her familiar symptoms for improved mobility getting out  of bed. Baseline:  Goal status: INITIAL  PLAN:  PT FREQUENCY: 1-2x/week  PT DURATION: 4 weeks  PLANNED INTERVENTIONS: 97164- PT Re-evaluation, 97750- Physical Performance Testing, 97110-Therapeutic exercises, 97530- Therapeutic activity, [id number]- Neuromuscular re-education, 97535- Self Care, 02859- Manual therapy, G0283- Electrical stimulation (unattended), 97035- Ultrasound, 02987- Traction (mechanical), 20560 (1-2 muscles), 20561 (3+ muscles)- Dry Needling, Patient/Family education, Balance training, Joint  mobilization, Spinal mobilization, Cryotherapy, and Moist heat.  PLAN FOR NEXT SESSION: NuStep, manual therapy, lumbar stabilization, lower extremity strengthening, and modalities as needed   Aina Rossbach, [place], PT 12/29/2023, 2:41 PM

## 2023-12-29 NOTE — Telephone Encounter (Signed)
 Copied from CRM [id number]. Topic: General - Other >> Dec 28, 2023  4:35 PM Mercer PEDLAR wrote: Reason for CRM: Candrika calling from [employer]'s patient assistance Program to reach [employer] regarding application. She stated that application is incomplete and is missing the product on pages 8 and 9 and is requesting a callback.  Callback number: [phone]

## 2023-12-29 NOTE — Telephone Encounter (Signed)
 Last page of application (RX) refaxed to company 12/29/23

## 2023-12-29 NOTE — Telephone Encounter (Signed)
 Follow up on 12/21/23 telephone encounter

## 2023-12-31 NOTE — Progress Notes (Signed)
 Pharmacy Medication Assistance Program Note    12/31/2023  Patient ID: Lisa Stephens, female   DOB: [birth year], 68 y.o.   MRN: [id number]     12/21/2023  Outreach [employer]  Manufacturer [employer]  [employer] Ozempic   Type of Radiographer, therapeutic Assistance  Date Application Received From Provider 12/23/2023  Date Application Submitted to Manufacturer 12/24/2023  Method Application Sent to Manufacturer Fax  Patient Assistance Determination Approved  Approval Start Date 12/30/2023  Approval End Date 04/06/2024     New - approved

## 2024-01-05 ENCOUNTER — Ambulatory Visit: Admitting: Physical Therapy

## 2024-01-05 DIAGNOSIS — M5416 Radiculopathy, lumbar region: Secondary | ICD-10-CM

## 2024-01-05 DIAGNOSIS — M4316 Spondylolisthesis, lumbar region: Secondary | ICD-10-CM | POA: Diagnosis not present

## 2024-01-05 DIAGNOSIS — M5459 Other low back pain: Secondary | ICD-10-CM | POA: Diagnosis not present

## 2024-01-05 DIAGNOSIS — M51369 Other intervertebral disc degeneration, lumbar region without mention of lumbar back pain or lower extremity pain: Secondary | ICD-10-CM | POA: Diagnosis not present

## 2024-01-05 DIAGNOSIS — M4727 Other spondylosis with radiculopathy, lumbosacral region: Secondary | ICD-10-CM | POA: Diagnosis not present

## 2024-01-05 NOTE — Therapy (Signed)
 OUTPATIENT PHYSICAL THERAPY THORACOLUMBAR TREATMENT  Patient Name: Lisa Stephens MRN: [id number] DOB:21-Apr-1955, 68 y.o., female Today's Date: 01/05/2024  END OF SESSION:  PT End of Session - 01/05/24 0938     Visit Number 6    Number of Visits 8    Date for Recertification  02/05/24    PT Start Time 0930    PT Stop Time 1015    PT Time Calculation (min) 45 min    Activity Tolerance Patient tolerated treatment well    Behavior During Therapy [facility] for tasks assessed/performed          Past Medical History:  Diagnosis Date   Arthritis    Blood transfusion without reported diagnosis    Cataract    small beginning   Depression    Diabetes mellitus without complication (HCC)    Fracture of distal end of right radius 08/21/2017   Hyperlipidemia    Hypertension    Hypothyroidism    Past Surgical History:  Procedure Laterality Date   ABDOMINAL HYSTERECTOMY     BACK SURGERY  09/2015   revision   MAXIMUM ACCESS (MAS)POSTERIOR LUMBAR INTERBODY FUSION (PLIF) 2 LEVEL N/A 07/02/2015   Procedure: L3-4 L4-5 Maximum access posterior lumbar interbody fusion;  Surgeon: Morene Hicks Ditty, MD;  Location: [facility];  Service: Neurosurgery;  Laterality: N/A;  L3-4 L4-5 Maximum access posterior lumbar interbody fusion   TUBAL LIGATION     Patient Active Problem List   Diagnosis Date Noted   Lumbar adjacent segment disease with spondylolisthesis 08/13/2022   Rotator cuff tendinitis, right 04/04/2020   Foot drop, right 09/26/2015   Stress incontinence in female 08/23/2015   Hearing loss 08/23/2015   Lumbosacral spondylosis with radiculopathy 07/02/2015   Hypothyroidism 05/28/2015   Vitamin D  deficiency 05/28/2015   Diabetes mellitus treated with oral medication (HCC) 12/08/2013   Hypertension associated with diabetes (HCC) 12/08/2013   Obesity 12/08/2013   Hyperlipidemia associated with type 2 diabetes mellitus (HCC) 09/07/2013   REFERRING PROVIDER: Jolinda Norene HERO, DO    REFERRING DIAG: Lumbosacral spondylosis with radiculopathy, Lumbar adjacent segment disease with spondylolisthesis   Rationale for Evaluation and Treatment: Rehabilitation  THERAPY DIAG:  Other low back pain  Radiculopathy, lumbar region  ONSET DATE: approximately 2 weeks ago  SUBJECTIVE:                                                                                                                                                                                           SUBJECTIVE STATEMENT: Doing much better.  PERTINENT HISTORY:  Hypertension, diabetes type 2, hearing loss, arthritis, depression, and previous back surgery  PAIN:  Are you having pain? Yes: NPRS scale: Current: Low/10. Pain location: left low back Pain description: catch  Aggravating factors: bending, twisting, rolling over Relieving factors: light activity  PRECAUTIONS: None  RED FLAGS: None   WEIGHT BEARING RESTRICTIONS: No  FALLS:  Has patient fallen in last 6 months? No  LIVING ENVIRONMENT: Lives with: lives with their spouse Lives in: House/apartment Stairs: Yes: External: 2-3 steps; none Has following equipment at home: None  OCCUPATION: retired  PLOF: Independent  PATIENT GOALS: reduced pain  NEXT MD VISIT: 12/14/23  OBJECTIVE:  Note: Objective measures were completed at Evaluation unless otherwise noted.  DIAGNOSTIC FINDINGS: 12/01/23 lumbar x-ray Exam results are not yet released  PATIENT SURVEYS:  Modified Oswestry:  MODIFIED OSWESTRY DISABILITY SCALE  Date: 12/09/23 Score  Pain intensity 1 = The pain is bad, but I can manage without having to take (1) I can stand as long as I want but, it increases my pain. pain medication.  2. Personal care (washing, dressing, etc.) 0 =  I can take care of myself normally without causing increased pain.  3. Lifting 0 = I can lift heavy weights without increased pain.  4. Walking 1 = Pain prevents me from walking more than 1 mile.  5.  Sitting 0 =  I can sit in any chair as long as I like.  6. Standing 3 =  Pain prevents me from standing more than 1/2 hour.  7. Sleeping 0 = Pain does not prevent me from sleeping well.  8. Social Life 2 = Pain prevents me from participating in more energetic activities (eg. sports, dancing).  9. Traveling 0 =  I can travel anywhere without increased pain.  10. Employment/ Homemaking 2 = I can perform most of my homemaking/job duties, but pain prevents me from performing more physically stressful activities (eg, lifting, vacuuming).  Total 9/50   Interpretation of scores: Score Category Description  0-20% Minimal Disability The patient can cope with most living activities. Usually no treatment is indicated apart from advice on lifting, sitting and exercise  21-40% Moderate Disability The patient experiences more pain and difficulty with sitting, lifting and standing. Travel and social life are more difficult and they may be disabled from work. Personal care, sexual activity and sleeping are not grossly affected, and the patient can usually be managed by conservative means  41-60% Severe Disability Pain remains the main problem in this group, but activities of daily living are affected. These patients require a detailed investigation  61-80% Crippled Back pain impinges on all aspects of the patient's life. Positive intervention is required  81-100% Bed-bound  These patients are either bed-bound or exaggerating their symptoms  Bluford FORBES [name], et al. Surgery versus conservative management of stable thoracolumbar fracture: the PRESTO feasibility RCT. [place]): [employer]; 2021 Nov. [facility] Technology Assessment, No. 25.62.) Appendix 3, Oswestry Disability Index category descriptors. Available from: FindJewelers.cz  Minimally Clinically Important Difference (MCID) = 12.8%  COGNITION: Overall cognitive status: Within functional limits for  tasks assessed     SENSATION: Patient reports no numbness or tingling  POSTURE: No Significant postural limitations  PALPATION: TTP: left quadratus lumborum   JOINT MOBILITY:  L1-3: WFL and nonpainful   L3-S1: WFL and tender   LUMBAR ROM:   AROM eval  Flexion 54; slight pain  Extension 12; slight pain  Right lateral flexion 50% limited  Left lateral flexion 75%  limited; familiar pain  Right rotation 25% limited  Left rotation  25% limited; slight pain   (Blank rows = not tested)  LOWER EXTREMITY ROM: WFL for activities assessed  LOWER EXTREMITY MMT:    MMT Right eval Left eval  Hip flexion 4+/5 4/5  Hip extension    Hip abduction    Hip adduction    Hip internal rotation    Hip external rotation    Knee flexion 4+/5 4+/5  Knee extension 4+/5 4/5  Ankle dorsiflexion 4+/5 4+/5  Ankle plantarflexion    Ankle inversion    Ankle eversion     (Blank rows = not tested)  FUNCTIONAL TESTS:  Bed mobility: slow and cautious movement  GAIT: Assistive device utilized: None Level of assistance: Complete Independence Comments: no significant gait deviations  TREATMENT DATE:    01/05/24:  Nustep level 4 x 15 minutes f/b back extension machine with 60# x 4 minutes f/b ab machine with 60# x 4 minutes f/b HMP and IFC at 80-150 Hz on 40% scan x 20 minutes to patient's left lumbar region while seated.       12/29/23:  Nustep level 5 x 15 minutes f/b back extension machine with 50# x 4 minutes f/b ab machine with 50# x 4 minutes f/b HMP and IFC at 80-150 Hz on 40% scan x 20 minutes to patient's left lumbar region while seated.      12/24/23:   LB  Nustep level 3 x 16 minutes XTS blue lat pull downs 3x10 and Rows 4 x 10 HEP green tband  ROWS and EXT x10    handout given HMP and IFC at 80-150 Hz on 40% scan x 15 minutes to patient's left low back region in seated position.                                                                                                                             PATIENT EDUCATION:  Education details: POC, prognosis, objective findings, and goals for physical therapy Person educated: Patient Education method: Explanation Education comprehension: verbalized understanding  HOME EXERCISE PROGRAM:   ASSESSMENT:  CLINICAL IMPRESSION: Patient very pleased with progress and feels she is almost back to normal.  She tolerated a 10# increase in back ext and ab curl machine without complaint.  OBJECTIVE IMPAIRMENTS: decreased activity tolerance, decreased ROM, decreased strength, impaired tone, and pain.   ACTIVITY LIMITATIONS: bending and bed mobility  PARTICIPATION LIMITATIONS: cleaning, laundry, and community activity  PERSONAL FACTORS: Past/current experiences and 3+ comorbidities: Hypertension, diabetes type 2, hearing loss, arthritis, depression, and previous back surgery are also affecting patient's functional outcome.   REHAB POTENTIAL: Good  CLINICAL DECISION MAKING: Stable/uncomplicated  EVALUATION COMPLEXITY: Low   GOALS: Goals reviewed with patient? Yes  LONG TERM GOALS: Target date: 01/06/24  Patient will be independent with her HEP. Baseline:  Goal status: INITIAL  2.  Patient will be able to complete her household activities without her familiar symptoms exceeding 5/10. Baseline:  Goal status: INITIAL  3.  Patient  will improve her ODI score to 5% disability or less for improved perceived function with her daily activities. Baseline:  Goal status: INITIAL  4.  Patient will be able to transfer from supine to sitting without reproducing her familiar symptoms for improved mobility getting out of bed. Baseline:  Goal status: INITIAL  PLAN:  PT FREQUENCY: 1-2x/week  PT DURATION: 4 weeks  PLANNED INTERVENTIONS: 97164- PT Re-evaluation, 97750- Physical Performance Testing, 97110-Therapeutic exercises, 97530- Therapeutic activity, [id number]- Neuromuscular re-education, 97535- Self Care, 02859- Manual  therapy, G0283- Electrical stimulation (unattended), 97035- Ultrasound, 02987- Traction (mechanical), 20560 (1-2 muscles), 20561 (3+ muscles)- Dry Needling, Patient/Family education, Balance training, Joint mobilization, Spinal mobilization, Cryotherapy, and Moist heat.  PLAN FOR NEXT SESSION: NuStep, manual therapy, lumbar stabilization, lower extremity strengthening, and modalities as needed   Ilisha Blust, [place], PT 01/05/2024, 10:19 AM

## 2024-01-11 ENCOUNTER — Ambulatory Visit: Attending: Family Medicine | Admitting: Physical Therapy

## 2024-01-11 DIAGNOSIS — M5416 Radiculopathy, lumbar region: Secondary | ICD-10-CM | POA: Diagnosis not present

## 2024-01-11 DIAGNOSIS — M5459 Other low back pain: Secondary | ICD-10-CM | POA: Diagnosis not present

## 2024-01-11 NOTE — Therapy (Signed)
 OUTPATIENT PHYSICAL THERAPY THORACOLUMBAR TREATMENT  Patient Name: Elmira Olkowski MRN: [id number] DOB:July 27, 1955, 68 y.o., female Today's Date: 01/11/2024  END OF SESSION:  PT End of Session - 01/11/24 0927     Visit Number 7    Number of Visits 8    Date for Recertification  02/05/24    Authorization - Visit Number 7    Authorization - Number of Visits 8    PT Start Time 0930    PT Stop Time 1010    PT Time Calculation (min) 40 min    Activity Tolerance Patient tolerated treatment well    Behavior During Therapy [facility] for tasks assessed/performed           Past Medical History:  Diagnosis Date   Arthritis    Blood transfusion without reported diagnosis    Cataract    small beginning   Depression    Diabetes mellitus without complication (HCC)    Fracture of distal end of right radius 08/21/2017   Hyperlipidemia    Hypertension    Hypothyroidism    Past Surgical History:  Procedure Laterality Date   ABDOMINAL HYSTERECTOMY     BACK SURGERY  09/2015   revision   MAXIMUM ACCESS (MAS)POSTERIOR LUMBAR INTERBODY FUSION (PLIF) 2 LEVEL N/A 07/02/2015   Procedure: L3-4 L4-5 Maximum access posterior lumbar interbody fusion;  Surgeon: Morene Hicks Ditty, MD;  Location: [facility];  Service: Neurosurgery;  Laterality: N/A;  L3-4 L4-5 Maximum access posterior lumbar interbody fusion   TUBAL LIGATION     Patient Active Problem List   Diagnosis Date Noted   Lumbar adjacent segment disease with spondylolisthesis 08/13/2022   Rotator cuff tendinitis, right 04/04/2020   Foot drop, right 09/26/2015   Stress incontinence in female 08/23/2015   Hearing loss 08/23/2015   Lumbosacral spondylosis with radiculopathy 07/02/2015   Hypothyroidism 05/28/2015   Vitamin D  deficiency 05/28/2015   Diabetes mellitus treated with oral medication (HCC) 12/08/2013   Hypertension associated with diabetes (HCC) 12/08/2013   Obesity 12/08/2013   Hyperlipidemia associated with type 2 diabetes  mellitus (HCC) 09/07/2013   REFERRING PROVIDER: Jolinda Norene HERO, DO   REFERRING DIAG: Lumbosacral spondylosis with radiculopathy, Lumbar adjacent segment disease with spondylolisthesis   Rationale for Evaluation and Treatment: Rehabilitation  THERAPY DIAG:  Other low back pain  Radiculopathy, lumbar region  ONSET DATE: approximately 2 weeks ago  SUBJECTIVE:                                                                                                                                                                                           SUBJECTIVE STATEMENT: Pt reports very  low back pain levels but mostly on the right today. Has been feeling good about her progress. Pt states she tried to join [employer] but they wouldn't take her insurance. States she has been exercising and walking at home.   PERTINENT HISTORY:  Hypertension, diabetes type 2, hearing loss, arthritis, depression, and previous back surgery  PAIN:  Are you having pain? Yes: NPRS scale: Current: 2/10. Pain location: right low back Pain description: catch  Aggravating factors: bending, twisting, rolling over Relieving factors: light activity  PRECAUTIONS: None  RED FLAGS: None   WEIGHT BEARING RESTRICTIONS: No  FALLS:  Has patient fallen in last 6 months? No  LIVING ENVIRONMENT: Lives with: lives with their spouse Lives in: House/apartment Stairs: Yes: External: 2-3 steps; none Has following equipment at home: None  OCCUPATION: retired  PLOF: Independent  PATIENT GOALS: reduced pain  NEXT MD VISIT: 12/14/23  OBJECTIVE:  Note: Objective measures were completed at Evaluation unless otherwise noted.  DIAGNOSTIC FINDINGS: 12/01/23 lumbar x-ray Exam results are not yet released  PATIENT SURVEYS:  Modified Oswestry:  MODIFIED OSWESTRY DISABILITY SCALE  Date: 12/09/23 Score  Pain intensity 1 = The pain is bad, but I can manage without having to take (1) I can stand as long as I want but, it  increases my pain. pain medication.  2. Personal care (washing, dressing, etc.) 0 =  I can take care of myself normally without causing increased pain.  3. Lifting 0 = I can lift heavy weights without increased pain.  4. Walking 1 = Pain prevents me from walking more than 1 mile.  5. Sitting 0 =  I can sit in any chair as long as I like.  6. Standing 3 =  Pain prevents me from standing more than 1/2 hour.  7. Sleeping 0 = Pain does not prevent me from sleeping well.  8. Social Life 2 = Pain prevents me from participating in more energetic activities (eg. sports, dancing).  9. Traveling 0 =  I can travel anywhere without increased pain.  10. Employment/ Homemaking 2 = I can perform most of my homemaking/job duties, but pain prevents me from performing more physically stressful activities (eg, lifting, vacuuming).  Total 9/50   Interpretation of scores: Score Category Description  0-20% Minimal Disability The patient can cope with most living activities. Usually no treatment is indicated apart from advice on lifting, sitting and exercise  21-40% Moderate Disability The patient experiences more pain and difficulty with sitting, lifting and standing. Travel and social life are more difficult and they may be disabled from work. Personal care, sexual activity and sleeping are not grossly affected, and the patient can usually be managed by conservative means  41-60% Severe Disability Pain remains the main problem in this group, but activities of daily living are affected. These patients require a detailed investigation  61-80% Crippled Back pain impinges on all aspects of the patient's life. Positive intervention is required  81-100% Bed-bound  These patients are either bed-bound or exaggerating their symptoms  Bluford FORBES [name], et al. Surgery versus conservative management of stable thoracolumbar fracture: the PRESTO feasibility RCT. [place] ([place]): [employer]; 2021 Nov.  [facility] Technology Assessment, No. 25.62.) Appendix 3, Oswestry Disability Index category descriptors. Available from: FindJewelers.cz  Minimally Clinically Important Difference (MCID) = 12.8%  COGNITION: Overall cognitive status: Within functional limits for tasks assessed     SENSATION: Patient reports no numbness or tingling  POSTURE: No Significant postural limitations  PALPATION: TTP:  left quadratus lumborum   JOINT MOBILITY:  L1-3: WFL and nonpainful   L3-S1: WFL and tender   LUMBAR ROM:   AROM eval  Flexion 54; slight pain  Extension 12; slight pain  Right lateral flexion 50% limited  Left lateral flexion 75%  limited; familiar pain  Right rotation 25% limited  Left rotation 25% limited; slight pain   (Blank rows = not tested)  LOWER EXTREMITY ROM: WFL for activities assessed  LOWER EXTREMITY MMT:    MMT Right eval Left eval  Hip flexion 4+/5 4/5  Hip extension    Hip abduction    Hip adduction    Hip internal rotation    Hip external rotation    Knee flexion 4+/5 4+/5  Knee extension 4+/5 4/5  Ankle dorsiflexion 4+/5 4+/5  Ankle plantarflexion    Ankle inversion    Ankle eversion     (Blank rows = not tested)  FUNCTIONAL TESTS:  Bed mobility: slow and cautious movement  GAIT: Assistive device utilized: None Level of assistance: Complete Independence Comments: no significant gait deviations  TREATMENT DATE:   01/11/24: Nustep L5 x 15 min Back ext machine 60#-70# 3x10 Ab machine 60# 3x10 Lat pull down 30# 3x10 Row# 3x10 IFC at 80-150 Hz on 40% scan x 20 minutes to patient's right lumbar region while seated.  01/05/24:  Nustep level 4 x 15 minutes f/b back extension machine with 60# x 4 minutes f/b ab machine with 60# x 4 minutes f/b HMP and IFC at 80-150 Hz on 40% scan x 20 minutes to patient's left lumbar region while seated.       12/29/23:  Nustep level 5 x 15 minutes f/b back extension machine with 50# x 4  minutes f/b ab machine with 50# x 4 minutes f/b HMP and IFC at 80-150 Hz on 40% scan x 20 minutes to patient's left lumbar region while seated.      12/24/23:   LB  Nustep level 3 x 16 minutes XTS blue lat pull downs 3x10 and Rows 4 x 10 HEP green tband  ROWS and EXT x10    handout given HMP and IFC at 80-150 Hz on 40% scan x 15 minutes to patient's left low back region in seated position.                                                                                                                            PATIENT EDUCATION:  Education details: POC, prognosis, objective findings, and goals for physical therapy Person educated: Patient Education method: Explanation Education comprehension: verbalized understanding  HOME EXERCISE PROGRAM:   ASSESSMENT:  CLINICAL IMPRESSION: Pt continues to progress well in therapy. Able to tolerate increase in resistance on nustep. Continued machine strengthening for progressive resistance training. Pt is close to meeting d/c status -- discussed today about gym membership to maintain her gains.   OBJECTIVE IMPAIRMENTS: decreased activity tolerance, decreased ROM, decreased strength, impaired tone, and pain.  ACTIVITY LIMITATIONS: bending and bed mobility  PARTICIPATION LIMITATIONS: cleaning, laundry, and community activity  PERSONAL FACTORS: Past/current experiences and 3+ comorbidities: Hypertension, diabetes type 2, hearing loss, arthritis, depression, and previous back surgery are also affecting patient's functional outcome.   REHAB POTENTIAL: Good  CLINICAL DECISION MAKING: Stable/uncomplicated  EVALUATION COMPLEXITY: Low   GOALS: Goals reviewed with patient? Yes  LONG TERM GOALS: Target date: 02/05/24  Patient will be independent with her HEP. Baseline:  Goal status: INITIAL  2.  Patient will be able to complete her household activities without her familiar symptoms exceeding 5/10. Baseline:  Goal status: INITIAL  3.   Patient will improve her ODI score to 5% disability or less for improved perceived function with her daily activities. Baseline:  Goal status: INITIAL  4.  Patient will be able to transfer from supine to sitting without reproducing her familiar symptoms for improved mobility getting out of bed. Baseline:  Goal status: INITIAL  PLAN:  PT FREQUENCY: 1-2x/week  PT DURATION: 4 weeks  PLANNED INTERVENTIONS: 97164- PT Re-evaluation, 97750- Physical Performance Testing, 97110-Therapeutic exercises, 97530- Therapeutic activity, [id number]- Neuromuscular re-education, 97535- Self Care, 02859- Manual therapy, G0283- Electrical stimulation (unattended), 97035- Ultrasound, 02987- Traction (mechanical), 20560 (1-2 muscles), 20561 (3+ muscles)- Dry Needling, Patient/Family education, Balance training, Joint mobilization, Spinal mobilization, Cryotherapy, and Moist heat.  PLAN FOR NEXT SESSION: Plan for d/c. NuStep, manual therapy, lumbar stabilization, lower extremity strengthening, and modalities as needed   Wilbur [place] April Ma L Tambria Pfannenstiel, PT 01/11/2024, 9:33 AM

## 2024-01-12 ENCOUNTER — Telehealth: Payer: Self-pay | Admitting: Pharmacist

## 2024-01-12 ENCOUNTER — Other Ambulatory Visit (INDEPENDENT_AMBULATORY_CARE_PROVIDER_SITE_OTHER)

## 2024-01-12 DIAGNOSIS — Z7984 Long term (current) use of oral hypoglycemic drugs: Secondary | ICD-10-CM

## 2024-01-12 DIAGNOSIS — E119 Type 2 diabetes mellitus without complications: Secondary | ICD-10-CM

## 2024-01-12 DIAGNOSIS — Z7985 Long-term (current) use of injectable non-insulin antidiabetic drugs: Secondary | ICD-10-CM

## 2024-01-12 NOTE — Telephone Encounter (Signed)
 Can we do dose increase to 2mg  Ozempic ? Patient is almost out of medication from PAP Discussed that [employer] is discontinuing PAP for next year Screened for LIS/extra help and she doesn't qualify

## 2024-01-12 NOTE — Progress Notes (Unsigned)
 01/12/2024 Name: Lisa Stephens MRN: [id number] DOB: Mar 08, 1956  No chief complaint on file.   Lisa Stephens is a 68 y.o. year old female who was referred for medication management by their primary care provider, Jolinda Potter M, DO.    They were referred to the pharmacist by their PCP for assistance in managing diabetes and medication access    Subjective: Lisa Stephens presents to clinic for follow-up for Ozempic  PAP enrollment and medication management. She was unable to afford her Rybelsus  prescription and wanted to transition to a weekly injection for additional weight loss. She tolerated Rybelsus  well. She has an unopened bottle of Rybelsus  at home, and has not taken today's dose in anticipation for receiving her first Ozempic  dose today.   She did not receive her shipment of Livalo , but did receive a letter in the mail regarding the medication. She is unsure of what the letter said, but is still taking atorvastatin  three times a week, endorses adherence.   Care Team: Primary Care Provider: Jolinda Potter HERO, DO ; Next Scheduled Visit: 03/16/24  Medication Access/Adherence  Current Pharmacy:  [facility] [address] [place] - [address] [place] [address] Phone: [phone] Fax: [phone]   Patient reports affordability concerns with their medications: No  Patient reports access/transportation concerns to their pharmacy: No  Patient reports adherence concerns with their medications:  Yes  -  History of intolerance to atorvastatin  and rosuvastatin, transitioning to pitvastatin Difficulty affording Rybelsus , transitioning to Ozempic  via Novo Nordisk PAP  Diabetes: Current medications: Rybelsus  14 mg daily --> Ozempic  0.5 mg weekly (today, 9/16) Medications tried in the past: metformin  (transitioned to Rybelsus  monotherapy) Statin: Pitvastatin 4 mg daily (pending shipment), currently still taking atorvastatin  three times weekly LDL of 103 on  10/17/23 ACEi/ARB: amlodipine -olmesartan  10-40 mg daily UACR of <6 on 04/24/23  A1c of 6.1% on 12/11/23, slightly increased from 5.9% on 04/24/23  Current glucose readings:  Using Contour meter; testing 1-2 times daily FBG: 102-120s mg/dL  Patient denies hypoglycemic s/sx including dizziness, shakiness, sweating. Patient denies hyperglycemic symptoms including polyuria, polydipsia, polyphagia, nocturia, neuropathy, blurred vision.  Current meal patterns:  Discussed meal planning options and Plate method for healthy eating Avoid sugary drinks and desserts Incorporate balanced protein, non starchy veggies, 1 serving of carbohydrate with each meal Increase water intake Increase physical activity as able  Current medication access support:  Enrolling in [employer] PAP, paperwork signed today (9/16) [employer]  Hyperlipidemia/ASCVD Risk Reduction Current lipid lowering medications: pitvastatin 4 mg daily (pending shipment), still taking atorvastatin  three times weekly while waiting for shipment Medications tried in the past: rosuvastatin, atorvastatin  - experienced myalgias on both  The 10-year ASCVD risk score (Arnett DK, et al., 2019) is: 26.2%   Values used to calculate the score:     Age: 68 years     Clincally relevant sex: Female     Is Non-Hispanic African American: Yes     Diabetic: Yes     Tobacco smoker: No     Systolic Blood Pressure: 141 mmHg     Is BP treated: Yes     HDL Cholesterol: 64 mg/dL     Total Cholesterol: 192 mg/dL   Objective: Lab Results  Component Value Date   HGBA1C 6.1 (H) 12/11/2023   Lab Results  Component Value Date   CREATININE 0.60 12/11/2023   BUN 11 12/11/2023   NA 133 (L) 12/11/2023   K 4.1 12/11/2023   CL 108 (H)  12/11/2023   CO2 21 12/11/2023   Lab Results  Component Value Date   CHOL 192 12/11/2023   HDL 64 12/11/2023   LDLCALC 114 (H) 12/11/2023   TRIG 78 12/11/2023   CHOLHDL 3.0 12/11/2023   Medications Reviewed  Today   Medications were not reviewed in this encounter     Assessment/Plan:  Diabetes: Currently controlled Goal of <7% Reviewed long term cardiovascular and renal outcomes of uncontrolled blood sugar Reviewed goal A1c, goal fasting, and goal 2 hour post prandial glucose Recommend to:  STOP Rybelsus   She did not take her Rybelsus  dose today in anticipation for her first Ozempic  injection.  START Ozempic  0.5 mg weekly x 4 weeks, then titrate to Ozempic  1 mg weekly  (pending PAP approval) One sample of 0.25/0.5 mg pen provided and assisted the patient in injecting her first dose today. Provided counseling on side effects and administration of Ozempic .  Enrolled in Novo Nordisk PAP for Ozempic  1 mg weekly Patient denies personal or family history of multiple endocrine neoplasia type 2, medullary thyroid  cancer; personal history of pancreatitis or gallbladder disease. Recommend to check glucose daily  Hyperlipidemia/ASCVD Risk Reduction: Currently uncontrolled.  Reviewed long term complications of uncontrolled cholesterol Recommend to: START pitvastatin 4 mg daily (once shipment arrives, already shipped from [facility]) In the meantime, continue atorvastatin  three times weekly.  G72 added for statin myopathy Considering history of statin intolerance, patient may benefit from an additional therapy in the future (e.g., PCSK9i, Zetia)   Mliss Tarry Griffin, PharmD, BCACP, CPP Clinical Pharmacist, [facility]

## 2024-01-18 ENCOUNTER — Telehealth: Payer: Self-pay

## 2024-01-18 NOTE — Telephone Encounter (Signed)
 Patient's Ozempic  1mg  supply is available for pick up.  She repots she is tolerating it well.  She will not be able to afford this medication in 2026.  We will have to revisit metformin  or other generic options.  She will start pitavastatin  as she has struggled with statin myalgias in the past.  She is tolerating amlodipine /olmesartan .  Lisa Stephens, PharmD, BCACP, CPP Clinical Pharmacist, [facility]

## 2024-01-18 NOTE — Telephone Encounter (Signed)
 Received 4 boxes of ozempic  through the patient assistance program. Called and left voicemail notifying patient that medication was here and ready for pick up

## 2024-01-19 ENCOUNTER — Ambulatory Visit: Admitting: *Deleted

## 2024-01-19 ENCOUNTER — Encounter: Payer: Self-pay | Admitting: *Deleted

## 2024-01-19 DIAGNOSIS — M5459 Other low back pain: Secondary | ICD-10-CM | POA: Diagnosis not present

## 2024-01-19 DIAGNOSIS — M5416 Radiculopathy, lumbar region: Secondary | ICD-10-CM

## 2024-01-19 NOTE — Therapy (Signed)
 OUTPATIENT PHYSICAL THERAPY THORACOLUMBAR TREATMENT  Patient Name: Lisa Stephens MRN: [id number] DOB:[birth year], 68 y.o., female Today's Date: 01/19/2024  END OF SESSION:  PT End of Session - 01/19/24 1023     Visit Number 8    Number of Visits 8    Date for Recertification  02/05/24    Authorization - Number of Visits 8     PT Start Time 1015    PT Stop Time 1100    PT Time Calculation (min) 45 min           Past Medical History:  Diagnosis Date   Arthritis    Blood transfusion without reported diagnosis    Cataract    small beginning   Depression    Diabetes mellitus without complication (HCC)    Fracture of distal end of right radius 08/21/2017   Hyperlipidemia    Hypertension    Hypothyroidism    Past Surgical History:  Procedure Laterality Date   ABDOMINAL HYSTERECTOMY     BACK SURGERY  09/2015   revision   MAXIMUM ACCESS (MAS)POSTERIOR LUMBAR INTERBODY FUSION (PLIF) 2 LEVEL N/A 07/02/2015   Procedure: L3-4 L4-5 Maximum access posterior lumbar interbody fusion;  Surgeon: Morene Hicks Ditty, MD;  Location: [facility];  Service: Neurosurgery;  Laterality: N/A;  L3-4 L4-5 Maximum access posterior lumbar interbody fusion   TUBAL LIGATION     Patient Active Problem List   Diagnosis Date Noted   Lumbar adjacent segment disease with spondylolisthesis 08/13/2022   Rotator cuff tendinitis, right 04/04/2020   Foot drop, right 09/26/2015   Stress incontinence in female 08/23/2015   Hearing loss 08/23/2015   Lumbosacral spondylosis with radiculopathy 07/02/2015   Hypothyroidism 05/28/2015   Vitamin D  deficiency 05/28/2015   Diabetes mellitus treated with oral medication (HCC) 12/08/2013   Hypertension associated with diabetes (HCC) 12/08/2013   Obesity 12/08/2013   Hyperlipidemia associated with type 2 diabetes mellitus (HCC) 09/07/2013   REFERRING PROVIDER: Jolinda Norene HERO, DO   REFERRING DIAG: Lumbosacral spondylosis with radiculopathy, Lumbar adjacent  segment disease with spondylolisthesis   Rationale for Evaluation and Treatment: Rehabilitation  THERAPY DIAG:  Other low back pain  Radiculopathy, lumbar region  ONSET DATE: approximately 2 weeks ago  SUBJECTIVE:                                                                                                                                                                                           SUBJECTIVE STATEMENT: Pt reports minimal back pain levels and mainly soreness. States she has been exercising and walking at home.   PERTINENT HISTORY:  Hypertension, diabetes type 2, hearing loss,  arthritis, depression, and previous back surgery  PAIN:  Are you having pain? Yes: NPRS scale: Current: 2/10. Pain location: right low back Pain description: catch  Aggravating factors: bending, twisting, rolling over Relieving factors: light activity  PRECAUTIONS: None  RED FLAGS: None   WEIGHT BEARING RESTRICTIONS: No  FALLS:  Has patient fallen in last 6 months? No  LIVING ENVIRONMENT: Lives with: lives with their spouse Lives in: House/apartment Stairs: Yes: External: 2-3 steps; none Has following equipment at home: None  OCCUPATION: retired  PLOF: Independent  PATIENT GOALS: reduced pain  NEXT MD VISIT: 12/14/23  OBJECTIVE:  Note: Objective measures were completed at Evaluation unless otherwise noted.  DIAGNOSTIC FINDINGS: 12/01/23 lumbar x-ray Exam results are not yet released  PATIENT SURVEYS:  Modified Oswestry:  MODIFIED OSWESTRY DISABILITY SCALE  Date: 12/09/23 Score  Pain intensity 1 = The pain is bad, but I can manage without having to take (1) I can stand as long as I want but, it increases my pain. pain medication.  2. Personal care (washing, dressing, etc.) 0 =  I can take care of myself normally without causing increased pain.  3. Lifting 0 = I can lift heavy weights without increased pain.  4. Walking 1 = Pain prevents me from walking more than 1 mile.   5. Sitting 0 =  I can sit in any chair as long as I like.  6. Standing 3 =  Pain prevents me from standing more than 1/2 hour.  7. Sleeping 0 = Pain does not prevent me from sleeping well.  8. Social Life 2 = Pain prevents me from participating in more energetic activities (eg. sports, dancing).  9. Traveling 0 =  I can travel anywhere without increased pain.  10. Employment/ Homemaking 2 = I can perform most of my homemaking/job duties, but pain prevents me from performing more physically stressful activities (eg, lifting, vacuuming).  Total 9/50   Interpretation of scores: Score Category Description  0-20% Minimal Disability The patient can cope with most living activities. Usually no treatment is indicated apart from advice on lifting, sitting and exercise  21-40% Moderate Disability The patient experiences more pain and difficulty with sitting, lifting and standing. Travel and social life are more difficult and they may be disabled from work. Personal care, sexual activity and sleeping are not grossly affected, and the patient can usually be managed by conservative means  41-60% Severe Disability Pain remains the main problem in this group, but activities of daily living are affected. These patients require a detailed investigation  61-80% Crippled Back pain impinges on all aspects of the patient's life. Positive intervention is required  81-100% Bed-bound  These patients are either bed-bound or exaggerating their symptoms  Bluford FORBES [name], et al. Surgery versus conservative management of stable thoracolumbar fracture: the PRESTO feasibility RCT. [place]): [employer]; 2021 Nov. [facility] [facility] Technology Assessment, No. 25.62.) Appendix 3, Oswestry Disability Index category descriptors. Available from: FindJewel[employer]cz  Minimally Clinically Important Difference (MCID) = 12.8%  COGNITION: Overall cognitive status: Within functional  limits for tasks assessed     SENSATION: Patient reports no numbness or tingling  POSTURE: No Significant postural limitations  PALPATION: TTP: left quadratus lumborum   JOINT MOBILITY:  L1-3: WFL and nonpainful   L3-S1: WFL and tender   LUMBAR ROM:   AROM eval  Flexion 54; slight pain  Extension 12; slight pain  Right lateral flexion 50% limited  Left lateral flexion 75%  limited; familiar  pain  Right rotation 25% limited  Left rotation 25% limited; slight pain   (Blank rows = not tested)  LOWER EXTREMITY ROM: WFL for activities assessed  LOWER EXTREMITY MMT:    MMT Right eval Left eval  Hip flexion 4+/5 4/5  Hip extension    Hip abduction    Hip adduction    Hip internal rotation    Hip external rotation    Knee flexion 4+/5 4+/5  Knee extension 4+/5 4/5  Ankle dorsiflexion 4+/5 4+/5  Ankle plantarflexion    Ankle inversion    Ankle eversion     (Blank rows = not tested)  FUNCTIONAL TESTS:  Bed mobility: slow and cautious movement  GAIT: Assistive device utilized: None Level of assistance: Complete Independence Comments: no significant gait deviations  TREATMENT DATE:   01/19/24: Nustep L5 x 15 min Back ext machine 60#- 3x10 Ab machine 60# 3x10  Blue XTS Row# 4x10 Blue XTS Ext pulldown 3x10  HEP  ROWS and Ext   with green tband discussed  IFC at 80-150 Hz on 40% scan x 15 minutes to patient's right lumbar region while seated.  01/05/24:  Nustep level 4 x 15 minutes f/b back extension machine with 60# x 4 minutes f/b ab machine with 60# x 4 minutes f/b HMP and IFC at 80-150 Hz on 40% scan x 20 minutes to patient's left lumbar region while seated.       12/29/23:  Nustep level 5 x 15 minutes f/b back extension machine with 50# x 4 minutes f/b ab machine with 50# x 4 minutes f/b HMP and IFC at 80-150 Hz on 40% scan x 20 minutes to patient's left lumbar region while seated.      12/24/23:   LB  Nustep level 3 x 16 minutes XTS blue lat pull downs  3x10 and Rows 4 x 10 HEP green tband  ROWS and EXT x10    handout given HMP and IFC at 80-150 Hz on 40% scan x 15 minutes to patient's left low back region in seated position.                                                                                                                            PATIENT EDUCATION:  Education details: POC, prognosis, objective findings, and goals for physical therapy Person educated: Patient Education method: Explanation Education comprehension: verbalized understanding  HOME EXERCISE PROGRAM:   ASSESSMENT:  CLINICAL IMPRESSION: Pt has progressed very well with mainly some tightness and soreness in LB and was able to meet majority of LTGs. She is independent with HEPand will be DC at this time   OBJECTIVE IMPAIRMENTS: decreased activity tolerance, decreased ROM, decreased strength, impaired tone, and pain.   ACTIVITY LIMITATIONS: bending and bed mobility  PARTICIPATION LIMITATIONS: cleaning, laundry, and community activity  PERSONAL FACTORS: Past/current experiences and 3+ comorbidities: Hypertension, diabetes type 2, hearing loss, arthritis, depression, and previous back surgery are also affecting patient's functional outcome.  REHAB POTENTIAL: Good  CLINICAL DECISION MAKING: Stable/uncomplicated  EVALUATION COMPLEXITY: Low   GOALS: Goals reviewed with patient? Yes  LONG TERM GOALS: Target date: 02/05/24  Patient will be independent with her HEP. Baseline:  Goal status: MET  2.  Patient will be able to complete her household activities without her familiar symptoms exceeding 5/10. Baseline:  Goal status: MET  3.  Patient will improve her ODI score to 5% disability or less for improved perceived function with her daily activities. Baseline:  Goal status: Not met 12%  4.  Patient will be able to transfer from supine to sitting without reproducing her familiar symptoms for improved mobility getting out of bed. Baseline:  Goal  status: MET  PLAN:  PT FREQUENCY: 1-2x/week  PT DURATION: 4 weeks  PLANNED INTERVENTIONS: 97164- PT Re-evaluation, 97750- Physical Performance Testing, 97110-Therapeutic exercises, 97530- Therapeutic activity, [id number]- Neuromuscular re-education, 97535- Self Care, 02859- Manual therapy, G0283- Electrical stimulation (unattended), 97035- Ultrasound, 02987- Traction (mechanical), 20560 (1-2 muscles), 20561 (3+ muscles)- Dry Needling, Patient/Family education, Balance training, Joint mobilization, Spinal mobilization, Cryotherapy, and Moist heat.  PLAN  d/c. To HEP   Georgi Tuel,CHRIS, PTA 01/19/2024, 1:15 PM   PHYSICAL THERAPY DISCHARGE SUMMARY  Visits from Start of Care: 8.  Current functional level related to goals / functional outcomes: See above.   Remaining deficits: All goals met.   Education / Equipment: HEP.   Patient agrees to discharge. Patient goals were met. Patient is being discharged due to meeting the stated rehab goals.    Chad Applegate MPT

## 2024-01-26 ENCOUNTER — Ambulatory Visit (INDEPENDENT_AMBULATORY_CARE_PROVIDER_SITE_OTHER): Admitting: Family Medicine

## 2024-01-26 ENCOUNTER — Encounter: Payer: Self-pay | Admitting: Family Medicine

## 2024-01-26 VITALS — BP 122/75 | HR 86 | Temp 97.5°F | Ht 64.0 in | Wt 199.1 lb

## 2024-01-26 DIAGNOSIS — Z8249 Family history of ischemic heart disease and other diseases of the circulatory system: Secondary | ICD-10-CM

## 2024-01-26 DIAGNOSIS — I152 Hypertension secondary to endocrine disorders: Secondary | ICD-10-CM

## 2024-01-26 DIAGNOSIS — Z7985 Long-term (current) use of injectable non-insulin antidiabetic drugs: Secondary | ICD-10-CM

## 2024-01-26 DIAGNOSIS — E1169 Type 2 diabetes mellitus with other specified complication: Secondary | ICD-10-CM | POA: Diagnosis not present

## 2024-01-26 DIAGNOSIS — Z789 Other specified health status: Secondary | ICD-10-CM | POA: Insufficient documentation

## 2024-01-26 DIAGNOSIS — E785 Hyperlipidemia, unspecified: Secondary | ICD-10-CM | POA: Diagnosis not present

## 2024-01-26 DIAGNOSIS — E1159 Type 2 diabetes mellitus with other circulatory complications: Secondary | ICD-10-CM

## 2024-01-26 DIAGNOSIS — Z0001 Encounter for general adult medical examination with abnormal findings: Secondary | ICD-10-CM

## 2024-01-26 DIAGNOSIS — Z0000 Encounter for general adult medical examination without abnormal findings: Secondary | ICD-10-CM

## 2024-01-26 MED ORDER — SEMAGLUTIDE (1 MG/DOSE) 4 MG/3ML ~~LOC~~ SOPN: 1.0000 mg | PEN_INJECTOR | SUBCUTANEOUS | Status: DC

## 2024-01-26 MED ORDER — AMLODIPINE-OLMESARTAN 10-40 MG PO TABS: 1.0000 | ORAL_TABLET | Freq: Every day | ORAL | 3 refills | Status: DC

## 2024-01-26 MED ORDER — PITAVASTATIN MAGNESIUM 4 MG PO TABS: 1.0000 | ORAL_TABLET | Freq: Every day | ORAL | 3 refills | Status: DC

## 2024-01-26 NOTE — Patient Instructions (Signed)
Schedule diabetic eye exam

## 2024-01-26 NOTE — Progress Notes (Signed)
 Subjective:    Lisa Stephens is a 68 y.o. female who presents for a Welcome to Medicare exam.   She reports no concerns today.  Had her physical last month and no new developments family history wise or personal history wise.  She never did receive the pitavastatin  and has subsequently been taking the atorvastatin  for now.  On 1 mg of Ozempic  weekly now and she reports appetite suppression but no substantial weight loss  Cardiac Risk Factors include: diabetes mellitus;obesity (BMI >30kg/m2)      Objective:    Today's Vitals   01/26/24 1423  BP: 122/75  Pulse: 86  Temp: (!) 97.5 F (36.4 C)  SpO2: 96%  Weight: 199 lb 2 oz (90.3 kg)  Height: 5' 4 (1.626 m)  Body mass index is 34.18 kg/m.  Medications Outpatient Encounter Medications as of 01/26/2024  Medication Sig   acetaminophen  (TYLENOL ) 500 MG tablet Take 1 tablet (500 mg total) by mouth every 6 (six) hours as needed.   aspirin  EC 81 MG tablet Take 81 mg by mouth daily.   Blood Glucose Monitoring Suppl (CONTOUR NEXT MONITOR) w/Device KIT    Cholecalciferol  (VITAMIN D -3) 125 MCG (5000 UT) TABS Take 5,000 Units by mouth daily.   CONTOUR NEXT TEST test strip    glucose blood (CONTOUR NEXT TEST) test strip as directed per package instructions In Vitro twice a day for 90 days   Lancets (ONETOUCH ULTRASOFT) lancets 1 Device by Misc.(Non-Drug; Combo Route) route daily.   levothyroxine  (SYNTHROID ) 100 MCG tablet TAKE 1 TABLET BY MOUTH  DAILY BEFORE BREAKFAST   methocarbamol  (ROBAXIN ) 500 MG tablet Take 1 tablet (500 mg total) by mouth every 8 (eight) hours as needed for muscle spasms.   Multiple Vitamins-Minerals (MULTIVITAMIN WITH MINERALS) tablet Take 1 tablet by mouth daily.   Omega-3 Fatty Acids (FISH OIL) 1000 MG CAPS Take 1,000 mg by mouth daily.   Semaglutide , 1 MG/DOSE, 4 MG/3ML SOPN Inject 1 mg as directed once a week.   [DISCONTINUED] amLODipine -olmesartan  (AZOR ) 10-40 MG tablet Take 1 tablet by mouth daily.    [DISCONTINUED] Pitavastatin  Magnesium  4 MG TABS Take 1 tablet (4 mg total) by mouth daily. To replace atorvastatin    [DISCONTINUED] Semaglutide ,0.25 or 0.5MG /DOS, (OZEMPIC , 0.25 OR 0.5 MG/DOSE,) 2 MG/3ML SOPN Inject 0.5 mg into the skin once a week.   amLODipine -olmesartan  (AZOR ) 10-40 MG tablet Take 1 tablet by mouth daily.   Coenzyme Q10 (CO Q 10 PO) Take 500 mg by mouth daily. (Patient not taking: Reported on 01/26/2024)   Pitavastatin  Magnesium  4 MG TABS Take 1 tablet (4 mg total) by mouth daily. To replace atorvastatin    No facility-administered encounter medications on file as of 01/26/2024.     History: Past Medical History:  Diagnosis Date   Arthritis    Blood transfusion without reported diagnosis    Cataract    small beginning   Depression    Diabetes mellitus without complication (HCC)    Fracture of distal end of right radius 08/21/2017   Hyperlipidemia    Hypertension    Hypothyroidism    Past Surgical History:  Procedure Laterality Date   ABDOMINAL HYSTERECTOMY     BACK SURGERY  09/2015   revision   MAXIMUM ACCESS (MAS)POSTERIOR LUMBAR INTERBODY FUSION (PLIF) 2 LEVEL N/A 07/02/2015   Procedure: L3-4 L4-5 Maximum access posterior lumbar interbody fusion;  Surgeon: Morene Hicks Ditty, MD;  Location: [facility];  Service: Neurosurgery;  Laterality: N/A;  L3-4 L4-5 Maximum access posterior lumbar  interbody fusion   TUBAL LIGATION      Family History  Problem Relation Age of Onset   Diabetes Mother    Hypertension Mother    Diabetes Sister    Hypertension Sister    Colon polyps Sister    Heart attack Sister    Diabetes Brother    Early death Son    Diabetes Maternal Aunt    Colon cancer Maternal Aunt    Diabetes Maternal Uncle    Esophageal cancer Neg Hx    Rectal cancer Neg Hx    Stomach cancer Neg Hx    Social History   Occupational History   Not on file  Tobacco Use   Smoking status: Never   Smokeless tobacco: Never  Vaping Use   Vaping status:  Never Used  Substance and Sexual Activity   Alcohol use: No   Drug use: No   Sexual activity: Not on file    Tobacco Counseling Counseling given: Not Answered   Immunizations and Health Maintenance Immunization History  Administered Date(s) Administered   Fluad Quad(high Dose 65+) 01/20/2022   Fluad Trivalent(High Dose 65+) 02/02/2023   INFLUENZA, HIGH DOSE SEASONAL PF 12/14/2023   Influenza Inj Mdck Quad Pf 01/16/2021   Influenza Inj Mdck Quad With Preservative 12/29/2017   Influenza Whole 01/23/2010, 01/27/2012, 01/25/2013, 02/02/2014, 01/18/2015   Influenza,inj,Quad PF,6+ Mos 12/26/2016   Influenza-Unspecified 01/23/2010, 01/27/2012, 01/25/2013, 02/02/2014, 01/06/2015, 01/18/2015, 01/15/2016, 12/29/2017, 01/19/2019, 01/16/2020   PFIZER(Purple Top)SARS-COV-2 Vaccination 06/21/2019, 07/12/2019, 01/17/2020   PNEUMOCOCCAL CONJUGATE-20 10/17/2022   Pneumococcal Polysaccharide-23 04/07/2013, 10/16/2014   Tdap 01/25/2013, 04/24/2023   Zoster Recombinant(Shingrix ) 10/17/2022, 04/24/2023   Zoster, Live 01/15/2016   Health Maintenance Due  Topic Date Due   OPHTHALMOLOGY EXAM  07/07/2023    Activities of Daily Living    01/26/2024    2:12 PM  In your present state of health, do you have any difficulty performing the following activities:  Hearing? 0  Vision? 1  Difficulty concentrating or making decisions? 0  Walking or climbing stairs? 0  Dressing or bathing? 0  Doing errands, shopping? 0  Preparing Food and eating ? N  Using the Toilet? N  In the past six months, have you accidently leaked urine? N  Do you have problems with loss of bowel control? N  Managing your Medications? N  Managing your Finances? N  Housekeeping or managing your Housekeeping? N    Physical Exam   Physical Exam (optional), or other factors deemed appropriate based on the beneficiary's medical and social history and current clinical standards.   Advanced Directives: Does Patient Have a  Medical Advance Directive?: Yes Does patient want to make changes to medical advance directive?: Yes (Inpatient - patient defers changing a medical advance directive and declines information at this time)  EKG:  normal EKG, normal sinus rhythm, unchanged from previous tracings      Assessment:    This is a routine wellness examination for this patient .   Welcome to Medicare preventive visit - Plan: EKG 12-Lead  Diabetes mellitus treated with injections of non-insulin medication (HCC) - Plan: EKG 12-Lead, Semaglutide , 1 MG/DOSE, 4 MG/3ML SOPN  Hyperlipidemia associated with type 2 diabetes mellitus (HCC) - Plan: EKG 12-Lead, Pitavastatin  Magnesium  4 MG TABS  Hypertension associated with diabetes (HCC) - Plan: EKG 12-Lead, amLODipine -olmesartan  (AZOR ) 10-40 MG tablet  Family history of ischemic heart disease - Plan: Pitavastatin  Magnesium  4 MG TABS  Full code status   Vision/Hearing screen No results found.   Goals  manage HTN (pt-stated)      Care Coordination Interventions: Evaluation of current treatment plan related to hypertension self management and patient's adherence to plan as established by provider Reviewed medications with patient and discussed importance of compliance Discussed plans with patient for ongoing care management follow up and provided patient with direct contact information for care management team Advised patient, providing education and rationale, to monitor blood pressure daily and record, calling PCP for findings outside established parameters Assessed social determinant of health barriers Confirmed patient has a BP cuff at home Discussed importance her checking her BP prior to taking her prescribed Medicine      Manage right thigh pain (THN) (pt-stated)      Care Coordination Interventions: Reviewed provider established plan for pain management Counseled on the importance of reporting any/all new or changed pain symptoms or management  strategies to pain management provider Advised patient to report to care team affect of pain on daily activities Discussed use of relaxation techniques and/or diversional activities to assist with pain reduction (distraction, imagery, relaxation, massage, acupressure, TENS, heat, and cold application Reviewed with patient prescribed pharmacological and nonpharmacological pain relief strategies Discussed wearing good fitting shoes when working on concrete floors at her job. 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 0  Risk for fall due to : No Fall Risks  Follow up Falls evaluation completed    Cognitive Function:    01/26/2024    2:27 PM  MMSE - Mini Mental State Exam  Orientation to time 5  Orientation to Place 4  Registration 3  Attention/ Calculation 3  Recall 2  Language- name 2 objects 2  Language- repeat 1  Language- follow 3 step command 3  Language- read & follow direction 1  Write a sentence 1  Copy design 1  Total score 26        Patient Care Team: Jolinda Norene HERO, DO as PCP - General (Family Medicine) Gretta Rosella, FNP as Nurse Practitioner (Nurse Practitioner)     Plan:    Will reorder pitavastatin .  Uncertain if maybe this needs a prior authorization.  EKG without evidence of acute ischemia.  Her blood pressure is under excellent control.  Continue Ozempic  1 mg weekly.  I have personally reviewed and  noted the following in the patient's chart:   Medical and social history Use of alcohol, tobacco or illicit drugs  Current medications and supplements including opioid prescriptions. Patient is not currently taking opioid prescriptions. Functional ability and status Nutritional status Physical activity Advanced directives List of other physicians Hospitalizations, surgeries, and ER visits in previous 12 months Vitals Screenings to include cognitive, depression, and falls Referrals and appointments  In addition, I have reviewed and discussed  with patient certain preventive protocols, quality metrics, and best practice recommendations. A written personalized care plan for preventive services as well as general preventive health recommendations were provided to patient.     Norene Fielding, DO 01/26/2024

## 2024-02-01 ENCOUNTER — Other Ambulatory Visit: Payer: Self-pay | Admitting: Family Medicine

## 2024-02-01 DIAGNOSIS — I152 Hypertension secondary to endocrine disorders: Secondary | ICD-10-CM

## 2024-03-01 NOTE — Telephone Encounter (Signed)
 Faxed Ozempic  2mg  refills to [facility].

## 2024-03-16 ENCOUNTER — Encounter: Payer: Self-pay | Admitting: Family Medicine

## 2024-03-16 ENCOUNTER — Other Ambulatory Visit (HOSPITAL_COMMUNITY): Payer: Self-pay

## 2024-03-16 ENCOUNTER — Ambulatory Visit: Payer: Self-pay | Admitting: Family Medicine

## 2024-03-16 ENCOUNTER — Telehealth: Payer: Self-pay

## 2024-03-16 VITALS — BP 131/70 | HR 60 | Temp 97.4°F | Ht 64.0 in | Wt 198.2 lb

## 2024-03-16 DIAGNOSIS — E119 Type 2 diabetes mellitus without complications: Secondary | ICD-10-CM

## 2024-03-16 DIAGNOSIS — I152 Hypertension secondary to endocrine disorders: Secondary | ICD-10-CM

## 2024-03-16 DIAGNOSIS — E1169 Type 2 diabetes mellitus with other specified complication: Secondary | ICD-10-CM

## 2024-03-16 LAB — BAYER DCA HB A1C WAIVED: HB A1C (BAYER DCA - WAIVED): 6 % — ABNORMAL HIGH (ref 4.8–5.6)

## 2024-03-16 MED ORDER — SEMAGLUTIDE (1 MG/DOSE) 4 MG/3ML ~~LOC~~ SOPN: 2.0000 mg | PEN_INJECTOR | SUBCUTANEOUS | Status: AC

## 2024-03-16 NOTE — Telephone Encounter (Signed)
 Pharmacy Patient Advocate Encounter   Received notification from Physician's Office that prior authorization for Pitavastatin  Calcium  4MG  tablets  is required/requested.   Insurance verification completed.   The patient is insured through [employer].   Per test claim: PA required; PA submitted to above mentioned insurance via Latent Key/confirmation #/EOC [id number] Status is pending

## 2024-03-16 NOTE — Progress Notes (Signed)
 Subjective: CC:DM PCP: Jolinda Norene HERO, DO YEP:Lisa Stephens is a 68 y.o. female presenting to clinic today for:  Type 2 Diabetes with hypertension, hyperlipidemia:  Cholesterol med changed to pitivastatin in October.  Continued on Ozempic  1mg  weekly.  Just picked up the 2 mg and she will be starting that soon.  She notes she never did get the pitavastatin  because her insurance is requiring her preauthorization and she has not had any updates about that PA.  She does voice frustration about inability to lose weight despite modification in her diet, often consuming less than 1200 cal/day that is high-fiber high-protein and pretty much no carbs.  She drinks water or black coffee only.  She tries to stay physically active though admits has not been walking as regularly over the last couple of weeks due to weather.  Had her diabetic eye exam done at [facility]  ROS: No chest pain, shortness of breath, edema   Diabetes Health Maintenance Due  Topic Date Due   OPHTHALMOLOGY EXAM  07/07/2023   HEMOGLOBIN A1C  06/09/2024   FOOT EXAM  12/13/2024    ROS: Per HPI  Allergies  Allergen Reactions   Voltaren [Diclofenac Sodium] Shortness Of Breath and Other (See Comments)    Wheezing, coughing, runny nose   Past Medical History:  Diagnosis Date   Arthritis    Blood transfusion without reported diagnosis    Cataract    small beginning   Depression    Diabetes mellitus without complication (HCC)    Fracture of distal end of right radius 08/21/2017   Hyperlipidemia    Hypertension    Hypothyroidism     Current Outpatient Medications:    acetaminophen  (TYLENOL ) 500 MG tablet, Take 1 tablet (500 mg total) by mouth every 6 (six) hours as needed., Disp: 30 tablet, Rfl: 0   amLODipine -olmesartan  (AZOR ) 10-40 MG tablet, TAKE 1 TABLET EVERY DAY, Disp: 100 tablet, Rfl: 2   aspirin  EC 81 MG tablet, Take 81 mg by mouth daily., Disp: , Rfl:    Blood Glucose Monitoring Suppl (CONTOUR NEXT MONITOR)  w/Device KIT, , Disp: , Rfl:    Cholecalciferol  (VITAMIN D -3) 125 MCG (5000 UT) TABS, Take 5,000 Units by mouth daily., Disp: , Rfl:    Coenzyme Q10 (CO Q 10 PO), Take 500 mg by mouth daily. (Patient not taking: Reported on 01/26/2024), Disp: , Rfl:    CONTOUR NEXT TEST test strip, , Disp: , Rfl:    glucose blood (CONTOUR NEXT TEST) test strip, as directed per package instructions In Vitro twice a day for 90 days, Disp: , Rfl:    Lancets (ONETOUCH ULTRASOFT) lancets, 1 Device by Misc.(Non-Drug; Combo Route) route daily., Disp: , Rfl:    levothyroxine  (SYNTHROID ) 100 MCG tablet, TAKE 1 TABLET BY MOUTH  DAILY BEFORE BREAKFAST, Disp: 100 tablet, Rfl: 1   methocarbamol  (ROBAXIN ) 500 MG tablet, Take 1 tablet (500 mg total) by mouth every 8 (eight) hours as needed for muscle spasms., Disp: 60 tablet, Rfl: 1   Multiple Vitamins-Minerals (MULTIVITAMIN WITH MINERALS) tablet, Take 1 tablet by mouth daily., Disp: , Rfl:    Omega-3 Fatty Acids (FISH OIL) 1000 MG CAPS, Take 1,000 mg by mouth daily., Disp: , Rfl:    Pitavastatin  Magnesium  4 MG TABS, Take 1 tablet (4 mg total) by mouth daily. To replace atorvastatin , Disp: 90 tablet, Rfl: 3   Semaglutide , 1 MG/DOSE, 4 MG/3ML SOPN, Inject 1 mg as directed once a week., Disp: , Rfl:  Social History  Socioeconomic History   Marital status: Single    Spouse name: charles   Number of children: Not on file   Years of education: Not on file   Highest education level: Not on file  Occupational History   Not on file  Tobacco Use   Smoking status: Never   Smokeless tobacco: Never  Vaping Use   Vaping status: Never Used  Substance and Sexual Activity   Alcohol use: No   Drug use: No   Sexual activity: Not on file  Other Topics Concern   Not on file  Social History Narrative   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)    Social Drivers of Corporate Investment Banker Strain: Low Risk  (12/06/2021)   Overall Financial Resource Strain (CARDIA)     Difficulty of Paying Living Expenses: Not hard at all  Food Insecurity: No Food Insecurity (12/06/2021)   Hunger Vital Sign    Worried About Running Out of Food in the Last Year: Never true    Ran Out of Food in the Last Year: Never true  Transportation Needs: No Transportation Needs (12/06/2021)   PRAPARE - Administrator, Civil Service (Medical): No    Lack of Transportation (Non-Medical): No  Physical Activity: Not on file  Stress: No Stress Concern Present (12/06/2021)   [employer] of Occupational Health - Occupational Stress Questionnaire    Feeling of Stress : Not at all  Social Connections: Socially Integrated (12/06/2021)   Social Connection and Isolation Panel    Frequency of Communication with Friends and Family: More than three times a week    Frequency of Social Gatherings with Friends and Family: More than three times a week    Attends Religious Services: 1 to 4 times per year    Active Member of [employer] or Organizations: Yes    Attends Banker Meetings: 1 to 4 times per year    Marital Status: Married  Catering Manager Violence: Not At Risk (12/06/2021)   Humiliation, Afraid, Rape, and Kick questionnaire    Fear of Current or Ex-Partner: No    Emotionally Abused: No    Physically Abused: No    Sexually Abused: No   Family History  Problem Relation Age of Onset   Diabetes Mother    Hypertension Mother    Diabetes Sister    Hypertension Sister    Colon polyps Sister    Heart attack Sister    Diabetes Brother    Early death Son    Diabetes Maternal Aunt    Colon cancer Maternal Aunt    Diabetes Maternal Uncle    Esophageal cancer Neg Hx    Rectal cancer Neg Hx    Stomach cancer Neg Hx     Objective: Office vital signs reviewed. BP 131/70   Pulse 60   Temp (!) 97.4 F (36.3 C)   Ht 5' 4 (1.626 m)   Wt 198 lb 4 oz (89.9 kg)   SpO2 95%   BMI 34.03 kg/m   Physical Examination:  General: Awake, alert, obese female, No acute  distress HEENT: sclera white, MMM Cardio: regular rate and rhythm, S1S2 heard, no murmurs appreciated Pulm: clear to auscultation bilaterally, no wheezes, rhonchi or rales; normal work of breathing on room air  Lab Results  Component Value Date   HGBA1C 6.1 (H) 12/11/2023    Assessment/ Plan: 68 y.o. female   Diabetes mellitus treated with injections of non-insulin medication (HCC) - Plan:  Bayer DCA Hb A1c Waived, Semaglutide , 1 MG/DOSE, 4 MG/3ML SOPN  Hyperlipidemia associated with type 2 diabetes mellitus (HCC)  Hypertension associated with diabetes (HCC)  Weight has essentially not changed over the last year despite increasing dose of GLP, lifestyle modification and regular exercise.  May consider switching her to Mounjaro after the new year since the patient assistance program with Ozempic  is dissolving next year.  For now, continue 2 mg semaglutide .  I have reached out to our clinical pharmacist and the PA team in efforts to determine what is going on with the status of her pitavastatin  prescription.  We discussed that if she either does not hear back in the next couple weeks or if it is unaffordable she is to contact me immediately so that we can change medication to an alternative   Norene CHRISTELLA Fielding, DO [facility] [phone]

## 2024-03-16 NOTE — Telephone Encounter (Signed)
 Patient is in office today and mentioned that she never received her  Pitavastatin  Magnesium  4 MG TABS   According to patient her pharmacy reached out to her about needing paperwork from insurance and then the patient never heard back.

## 2024-03-21 ENCOUNTER — Other Ambulatory Visit (HOSPITAL_COMMUNITY): Payer: Self-pay

## 2024-03-22 ENCOUNTER — Telehealth: Payer: Self-pay | Admitting: Pharmacist

## 2024-03-22 ENCOUNTER — Other Ambulatory Visit (HOSPITAL_COMMUNITY): Payer: Self-pay

## 2024-03-22 DIAGNOSIS — E1169 Type 2 diabetes mellitus with other specified complication: Secondary | ICD-10-CM

## 2024-03-22 DIAGNOSIS — G720 Drug-induced myopathy: Secondary | ICD-10-CM

## 2024-03-22 MED ORDER — ZYPITAMAG 4 MG PO TABS: 4.0000 mg | ORAL_TABLET | Freq: Every day | ORAL | 4 refills | Status: DC

## 2024-03-22 NOTE — Addendum Note (Signed)
 Addended by: JOLINDA NORENE HERO on: 03/22/2024 02:16 PM   Modules accepted: Orders

## 2024-03-22 NOTE — Telephone Encounter (Signed)
 Pharmacy Patient Advocate Encounter  Received notification from [employer] that Prior Authorization for Pitavastatin  Calcium  4MG  tablets has been DENIED.  Full denial letter will be uploaded to the media tab. See denial reason below.  Non-formulary pitavastatin ; must use preferred alternatives (ezetimibe, lovastatin, pravastatin, simvastatin, Zypitamag ) or submit medical justification for exception.   PA #/Case ID/Reference #: [id number]

## 2024-03-22 NOTE — Telephone Encounter (Signed)
°  Called patient to inform She stated [facility] never filled on 01/27/24 as reported--they stated they had to order it, but never called her back Product must be pitavastatin  MAGNESIUM  (Zypitamag ) not CALCIUM  (Livalo ) New RX sent today and informed patient to call [facility] first to follow up  Mliss Tarry Griffin, PharmD, BCACP, CPP Clinical Pharmacist, [facility]

## 2024-03-22 NOTE — Telephone Encounter (Signed)
 Zypitamag  sent as this is chemically the same as Livalo .  Meds ordered this encounter  Medications   Pitavastatin  Magnesium  (ZYPITAMAG ) 4 MG TABS    Sig: Take 1 tablet (4 mg total) by mouth daily.    Dispense:  90 tablet    Refill:  4

## 2024-03-23 ENCOUNTER — Telehealth: Payer: Self-pay

## 2024-03-23 ENCOUNTER — Other Ambulatory Visit: Payer: Self-pay

## 2024-03-23 DIAGNOSIS — E1169 Type 2 diabetes mellitus with other specified complication: Secondary | ICD-10-CM

## 2024-03-23 DIAGNOSIS — G720 Drug-induced myopathy: Secondary | ICD-10-CM

## 2024-03-23 MED ORDER — ZYPITAMAG 4 MG PO TABS: 4.0000 mg | ORAL_TABLET | Freq: Every day | ORAL | 4 refills | Status: DC

## 2024-03-23 NOTE — Telephone Encounter (Signed)
 Copied from CRM #[id number]. Topic: Clinical - Prescription Issue >> Mar 23, 2024  8:38 AM Farrel B wrote: Reason for CRM: Pitavastatin  Magnesium  (ZYPITAMAG ) 4 MG TABS please send to [facility] - [place], [place] - [address]  Phone: [phone] Fax: [phone]  Corrie is extremely to high and she can't afford the medication Please call to advise.

## 2024-03-23 NOTE — Telephone Encounter (Signed)
 Switched and pt notified. LS

## 2024-04-01 NOTE — Telephone Encounter (Signed)
 The following changes are in order for 2026:   Medicare patients will no longer be eligible for Ozempic .   This means that if you currently receive Ozempic  or Rybelsus  at no cost through the program, starting in January 2026 youll need to use your insurance to continue on these medicines.     In 2026, the maximum yearly out-of-pocket cost for medications is $2,100 for all Medicare beneficiaries. This means you will not have to pay more than $2,100 in total for all prescription medications filled on your Medicare plan in 2026. You may also choose to enroll in a Medicare Prescription Payment Plan through your chosen 2026 Medicare plan. This lets you spread out your prescription drug costs over the year instead of laying a large amount all at once at the pharmacy.     Medicare Extra Help   Some patients may qualify for a program called Medicare Extra Help that could help lower the cost of prescriptions on your chosen Medicare plan. Please visit [email or web address] to apply, or respond to this message and one of our pharmacy team members can call you to help apply.

## 2024-04-04 ENCOUNTER — Telehealth: Payer: Self-pay | Admitting: Family Medicine

## 2024-04-04 NOTE — Telephone Encounter (Signed)
 Pt dropped off prior auth for PITAVASTATIN  4 MG TABLET. WILL FAX TO PA

## 2024-04-14 ENCOUNTER — Other Ambulatory Visit: Payer: Self-pay | Admitting: Family Medicine

## 2024-04-14 DIAGNOSIS — E039 Hypothyroidism, unspecified: Secondary | ICD-10-CM

## 2024-04-21 ENCOUNTER — Other Ambulatory Visit (HOSPITAL_COMMUNITY): Payer: Self-pay

## 2024-04-21 NOTE — Telephone Encounter (Signed)
 Per test claim:  Brand name Zypitamag  is covered and 30 day copay is $218.  No PAP or copay card available. Possible [employer] grant depending on the diagnosis.  Zypitamag  has a website/ pharmacy where patients can get home delivery from them for cash price ([email or web address]/one-[email or web address]-per-day)

## 2024-05-12 ENCOUNTER — Telehealth: Payer: Self-pay | Admitting: Pharmacist

## 2024-05-12 ENCOUNTER — Other Ambulatory Visit: Admitting: Pharmacist

## 2024-05-12 DIAGNOSIS — E1169 Type 2 diabetes mellitus with other specified complication: Secondary | ICD-10-CM

## 2024-05-12 DIAGNOSIS — Z7985 Long-term (current) use of injectable non-insulin antidiabetic drugs: Secondary | ICD-10-CM

## 2024-05-12 DIAGNOSIS — G720 Drug-induced myopathy: Secondary | ICD-10-CM | POA: Insufficient documentation

## 2024-05-12 DIAGNOSIS — E782 Mixed hyperlipidemia: Secondary | ICD-10-CM

## 2024-05-12 MED ORDER — ATORVASTATIN CALCIUM 20 MG PO TABS: 20.0000 mg | ORAL_TABLET | ORAL | Status: AC

## 2024-05-12 MED ORDER — NEXLETOL 180 MG PO TABS: 180.0000 mg | ORAL_TABLET | Freq: Every day | ORAL | Status: AC

## 2024-05-12 NOTE — Progress Notes (Signed)
 "  05/12/24 Name: Tiarra Anastacio MRN: [id number] DOB: 10-24-1955  Chief Complaint  Patient presents with   Diabetes Type 2        Cigi Bega is a 69 y.o. year old female who was referred for medication management by their primary care provider, Jolinda Potter M, DO.    They were referred to the pharmacist by their PCP for assistance in managing diabetes and medication access    Subjective: Mariha presents to clinic for follow-up for Ozempic  PAP enrollment and medication management. She was unable to afford her Rybelsus  prescription and wanted to transition to a weekly injection for additional weight loss. Successfully transitioned to Ozempic  at home and is now up to 1mg  weekly, however she is unable to get Ozemipc via [employer] PAP  Care Team: Primary Care Provider: Jolinda Potter HERO, DO ; Next Scheduled Visit: 03/16/24  Medication Access/Adherence  Current Pharmacy:  [facility] [address], [place] - [address] [place] [address] Phone: [phone] Fax: [phone]  [facility] - [place], [place] - [address] [place] [address] Phone: [phone] Fax: [phone]   Patient reports affordability concerns with their medications: No  Patient reports access/transportation concerns to their pharmacy: No  Patient reports adherence concerns with their medications:  Yes  -  History of intolerance to atorvastatin  and rosuvastatin, transitioning to pitvastatin Difficulty affording Rybelsus , transitioning to Ozempic  via [employer] PAP  Diabetes: Current medications: Ozempic  1 mg weekly-->will transition to Trulicity 3mg  due cost and program  Medications tried in the past: metformin  (transitioned to Rybelsus  monotherapy) Statin: Pitvastatin 4 mg daily (pending shipment), currently still taking atorvastatin  three times weekly LDL of 103 on 10/17/23 ACEi/ARB: amlodipine -olmesartan  10-40 mg daily UACR  of <6 on 04/24/23  A1c of 6.1% on 12/11/23-->A1c 6.0% on 03/16/24  Current glucose readings:  Using Contour meter; testing 1-2 times daily FBG: 100-120s mg/dL After breakfast 890  Patient denies hypoglycemic s/sx including dizziness, shakiness, sweating. Patient denies hyperglycemic symptoms including polyuria, polydipsia, polyphagia, nocturia, neuropathy, blurred vision.  Current meal patterns:  Discussed meal planning options and Plate method for healthy eating Avoid sugary drinks and desserts Incorporate balanced protein, non starchy veggies, 1 serving of carbohydrate with each meal Increase water intake Increase physical activity as able  Current medication access support:  Enrolling in [employer] PAP, paperwork signed today (9/16) [employer]  Hyperlipidemia/ASCVD Risk Reduction Current lipid lowering medications:  Atorvastatin  20mg  3 times weekly Medications tried in the past: rosuvastatin, atorvastatin  - experienced myalgias on both  The 10-year ASCVD risk score (Arnett DK, et al., 2019) is: 33.4%   Values used to calculate the score:     Age: 18 years     Clinically relevant sex: Female     Is Non-Hispanic African American: Yes     Diabetic: Yes     Tobacco smoker: No     Systolic Blood Pressure: 157 mmHg     Is BP treated: Yes     HDL Cholesterol: 64 mg/dL     Total Cholesterol: 192 mg/dL   Objective: Lab Results  Component Value Date   HGBA1C 6.0 (H) 03/16/2024   Lab Results  Component Value Date   CREATININE 0.60 12/11/2023   BUN 11 12/11/2023   NA 133 (L) 12/11/2023   K 4.1 12/11/2023   CL 108 (H) 12/11/2023   CO2 21 12/11/2023   Lab Results  Component Value Date   CHOL  192 12/11/2023   HDL 64 12/11/2023   LDLCALC 114 (H) 12/11/2023   TRIG 78 12/11/2023   CHOLHDL 3.0 12/11/2023   Medications Reviewed Today   Medications were not reviewed in this encounter     Assessment/Plan:  Diabetes: Currently controlled Goal of <7% Reviewed  long term cardiovascular and renal outcomes of uncontrolled blood sugar Reviewed goal A1c, goal fasting, and goal 2 hour post prandial glucose Recommend to:  Continue Ozempic  1 mg weekly  Tolerating well Provided counseling on side effects and administration of Ozempic .  Will transition patient to Trulicity 3mg  weekly via [employer] PAP  Patient denies personal or family history of multiple endocrine neoplasia type 2, medullary thyroid  cancer; personal history of pancreatitis or gallbladder disease. Recommend to check glucose daily  Hyperlipidemia/ASCVD Risk Reduction: Currently uncontrolled.  Reviewed long term complications of uncontrolled cholesterol Recommend to: Continue atorvastatin  20mg  3 times weekly; no myopathy on low dose. Livalo  was cost prohibitive.  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

## 2024-05-12 NOTE — Telephone Encounter (Signed)
 Please route Lisa Stephens cares PAP to me for new start Trulicity 3mg  weekly. Patient was previously enrolled in the [employer] PAP for Ozempic .

## 2024-07-18 ENCOUNTER — Ambulatory Visit: Admitting: Family Medicine
